# Patient Record
Sex: Female | Born: 1940
Health system: Southern US, Community
[De-identification: ages and names within clinical notes are randomized; demographics above are authoritative.]

## PROBLEM LIST (undated history)

## (undated) DIAGNOSIS — E785 Hyperlipidemia, unspecified: Secondary | ICD-10-CM

## (undated) DIAGNOSIS — G20 Parkinson's disease: Secondary | ICD-10-CM

## (undated) DIAGNOSIS — F329 Major depressive disorder, single episode, unspecified: Secondary | ICD-10-CM

## (undated) DIAGNOSIS — H269 Unspecified cataract: Secondary | ICD-10-CM

## (undated) DIAGNOSIS — Z86010 Personal history of colonic polyps: Secondary | ICD-10-CM

## (undated) DIAGNOSIS — H04123 Dry eye syndrome of bilateral lacrimal glands: Secondary | ICD-10-CM

## (undated) DIAGNOSIS — Z860101 Personal history of adenomatous and serrated colon polyps: Secondary | ICD-10-CM

## (undated) DIAGNOSIS — F32A Depression, unspecified: Secondary | ICD-10-CM

## (undated) DIAGNOSIS — K5790 Diverticulosis of intestine, part unspecified, without perforation or abscess without bleeding: Secondary | ICD-10-CM

## (undated) DIAGNOSIS — K648 Other hemorrhoids: Secondary | ICD-10-CM

## (undated) HISTORY — PX: EYE SURGERY: SHX253

## (undated) HISTORY — DX: Diverticulosis of intestine, part unspecified, without perforation or abscess without bleeding: K57.90

## (undated) HISTORY — DX: Personal history of colonic polyps: Z86.010

## (undated) HISTORY — DX: Unspecified cataract: H26.9

## (undated) HISTORY — DX: Personal history of adenomatous and serrated colon polyps: Z86.0101

## (undated) HISTORY — PX: OTHER SURGICAL HISTORY: SHX169

## (undated) HISTORY — PX: COLONOSCOPY: SHX174

## (undated) HISTORY — DX: Dry eye syndrome of bilateral lacrimal glands: H04.123

## (undated) HISTORY — DX: Parkinson's disease: G20

## (undated) HISTORY — PX: PELVIC FLOOR REPAIR: SHX2192

## (undated) HISTORY — PX: TONSILLECTOMY: SUR1361

## (undated) HISTORY — DX: Major depressive disorder, single episode, unspecified: F32.9

## (undated) HISTORY — DX: Depression, unspecified: F32.A

## (undated) HISTORY — PX: VAGINAL HYSTERECTOMY: SUR661

## (undated) HISTORY — DX: Other hemorrhoids: K64.8

---

## 2011-04-29 ENCOUNTER — Other Ambulatory Visit: Payer: Self-pay | Admitting: Family Medicine

## 2011-04-29 ENCOUNTER — Other Ambulatory Visit: Payer: Self-pay | Admitting: Obstetrics & Gynecology

## 2011-04-29 DIAGNOSIS — Z1231 Encounter for screening mammogram for malignant neoplasm of breast: Secondary | ICD-10-CM

## 2011-06-02 ENCOUNTER — Ambulatory Visit: Payer: Self-pay | Admitting: Internal Medicine

## 2011-06-21 ENCOUNTER — Ambulatory Visit
Admission: RE | Admit: 2011-06-21 | Discharge: 2011-06-21 | Disposition: A | Discharge status: Home / Self Care | Payer: Medicare Other | Source: Ambulatory Visit | Attending: Obstetrics & Gynecology | Admitting: Obstetrics & Gynecology

## 2011-06-21 DIAGNOSIS — Z1231 Encounter for screening mammogram for malignant neoplasm of breast: Secondary | ICD-10-CM

## 2011-07-19 DIAGNOSIS — M8430XA Stress fracture, unspecified site, initial encounter for fracture: Secondary | ICD-10-CM | POA: Diagnosis not present

## 2011-08-02 DIAGNOSIS — F329 Major depressive disorder, single episode, unspecified: Secondary | ICD-10-CM | POA: Diagnosis not present

## 2011-08-27 ENCOUNTER — Other Ambulatory Visit (HOSPITAL_COMMUNITY)
Admission: RE | Admit: 2011-08-27 | Discharge: 2011-08-27 | Disposition: A | Discharge status: Home / Self Care | Payer: Medicare Other | Source: Ambulatory Visit | Attending: Obstetrics and Gynecology | Admitting: Obstetrics and Gynecology

## 2011-08-27 DIAGNOSIS — D071 Carcinoma in situ of vulva: Secondary | ICD-10-CM | POA: Diagnosis not present

## 2011-08-27 DIAGNOSIS — Z124 Encounter for screening for malignant neoplasm of cervix: Secondary | ICD-10-CM | POA: Diagnosis not present

## 2011-08-27 DIAGNOSIS — Z01419 Encounter for gynecological examination (general) (routine) without abnormal findings: Secondary | ICD-10-CM | POA: Diagnosis not present

## 2011-08-27 DIAGNOSIS — R350 Frequency of micturition: Secondary | ICD-10-CM | POA: Diagnosis not present

## 2011-10-04 DIAGNOSIS — M25579 Pain in unspecified ankle and joints of unspecified foot: Secondary | ICD-10-CM | POA: Diagnosis not present

## 2011-10-04 DIAGNOSIS — M8430XA Stress fracture, unspecified site, initial encounter for fracture: Secondary | ICD-10-CM | POA: Diagnosis not present

## 2011-10-07 DIAGNOSIS — J029 Acute pharyngitis, unspecified: Secondary | ICD-10-CM | POA: Diagnosis not present

## 2011-10-07 DIAGNOSIS — R52 Pain, unspecified: Secondary | ICD-10-CM | POA: Diagnosis not present

## 2011-10-07 DIAGNOSIS — J329 Chronic sinusitis, unspecified: Secondary | ICD-10-CM | POA: Diagnosis not present

## 2011-10-14 DIAGNOSIS — M25579 Pain in unspecified ankle and joints of unspecified foot: Secondary | ICD-10-CM | POA: Diagnosis not present

## 2011-10-14 DIAGNOSIS — M8430XA Stress fracture, unspecified site, initial encounter for fracture: Secondary | ICD-10-CM | POA: Diagnosis not present

## 2011-10-19 DIAGNOSIS — M25579 Pain in unspecified ankle and joints of unspecified foot: Secondary | ICD-10-CM | POA: Diagnosis not present

## 2011-11-01 DIAGNOSIS — F329 Major depressive disorder, single episode, unspecified: Secondary | ICD-10-CM | POA: Diagnosis not present

## 2011-12-15 DIAGNOSIS — M25579 Pain in unspecified ankle and joints of unspecified foot: Secondary | ICD-10-CM | POA: Diagnosis not present

## 2011-12-21 DIAGNOSIS — M19079 Primary osteoarthritis, unspecified ankle and foot: Secondary | ICD-10-CM | POA: Diagnosis not present

## 2011-12-23 DIAGNOSIS — S93409A Sprain of unspecified ligament of unspecified ankle, initial encounter: Secondary | ICD-10-CM | POA: Diagnosis not present

## 2012-01-03 DIAGNOSIS — M24873 Other specific joint derangements of unspecified ankle, not elsewhere classified: Secondary | ICD-10-CM | POA: Diagnosis not present

## 2012-01-03 DIAGNOSIS — S93409A Sprain of unspecified ligament of unspecified ankle, initial encounter: Secondary | ICD-10-CM | POA: Diagnosis not present

## 2012-01-03 DIAGNOSIS — N760 Acute vaginitis: Secondary | ICD-10-CM | POA: Diagnosis not present

## 2012-01-03 DIAGNOSIS — D071 Carcinoma in situ of vulva: Secondary | ICD-10-CM | POA: Diagnosis not present

## 2012-01-03 DIAGNOSIS — M6281 Muscle weakness (generalized): Secondary | ICD-10-CM | POA: Diagnosis not present

## 2012-01-03 DIAGNOSIS — M24876 Other specific joint derangements of unspecified foot, not elsewhere classified: Secondary | ICD-10-CM | POA: Diagnosis not present

## 2012-01-03 DIAGNOSIS — M25579 Pain in unspecified ankle and joints of unspecified foot: Secondary | ICD-10-CM | POA: Diagnosis not present

## 2012-01-10 DIAGNOSIS — S93409A Sprain of unspecified ligament of unspecified ankle, initial encounter: Secondary | ICD-10-CM | POA: Diagnosis not present

## 2012-01-10 DIAGNOSIS — M25579 Pain in unspecified ankle and joints of unspecified foot: Secondary | ICD-10-CM | POA: Diagnosis not present

## 2012-01-10 DIAGNOSIS — M6281 Muscle weakness (generalized): Secondary | ICD-10-CM | POA: Diagnosis not present

## 2012-01-12 DIAGNOSIS — M24873 Other specific joint derangements of unspecified ankle, not elsewhere classified: Secondary | ICD-10-CM | POA: Diagnosis not present

## 2012-01-12 DIAGNOSIS — M25579 Pain in unspecified ankle and joints of unspecified foot: Secondary | ICD-10-CM | POA: Diagnosis not present

## 2012-01-12 DIAGNOSIS — M24876 Other specific joint derangements of unspecified foot, not elsewhere classified: Secondary | ICD-10-CM | POA: Diagnosis not present

## 2012-01-12 DIAGNOSIS — M6281 Muscle weakness (generalized): Secondary | ICD-10-CM | POA: Diagnosis not present

## 2012-01-17 DIAGNOSIS — M6281 Muscle weakness (generalized): Secondary | ICD-10-CM | POA: Diagnosis not present

## 2012-01-17 DIAGNOSIS — M25579 Pain in unspecified ankle and joints of unspecified foot: Secondary | ICD-10-CM | POA: Diagnosis not present

## 2012-01-17 DIAGNOSIS — S93409A Sprain of unspecified ligament of unspecified ankle, initial encounter: Secondary | ICD-10-CM | POA: Diagnosis not present

## 2012-01-19 DIAGNOSIS — M6281 Muscle weakness (generalized): Secondary | ICD-10-CM | POA: Diagnosis not present

## 2012-01-19 DIAGNOSIS — S93409A Sprain of unspecified ligament of unspecified ankle, initial encounter: Secondary | ICD-10-CM | POA: Diagnosis not present

## 2012-01-19 DIAGNOSIS — M25579 Pain in unspecified ankle and joints of unspecified foot: Secondary | ICD-10-CM | POA: Diagnosis not present

## 2012-01-20 DIAGNOSIS — L03119 Cellulitis of unspecified part of limb: Secondary | ICD-10-CM | POA: Diagnosis not present

## 2012-01-20 DIAGNOSIS — L02419 Cutaneous abscess of limb, unspecified: Secondary | ICD-10-CM | POA: Diagnosis not present

## 2012-01-24 DIAGNOSIS — M25579 Pain in unspecified ankle and joints of unspecified foot: Secondary | ICD-10-CM | POA: Diagnosis not present

## 2012-01-24 DIAGNOSIS — M6281 Muscle weakness (generalized): Secondary | ICD-10-CM | POA: Diagnosis not present

## 2012-01-24 DIAGNOSIS — S93409A Sprain of unspecified ligament of unspecified ankle, initial encounter: Secondary | ICD-10-CM | POA: Diagnosis not present

## 2012-01-26 DIAGNOSIS — S93409A Sprain of unspecified ligament of unspecified ankle, initial encounter: Secondary | ICD-10-CM | POA: Diagnosis not present

## 2012-01-26 DIAGNOSIS — M25579 Pain in unspecified ankle and joints of unspecified foot: Secondary | ICD-10-CM | POA: Diagnosis not present

## 2012-01-26 DIAGNOSIS — M6281 Muscle weakness (generalized): Secondary | ICD-10-CM | POA: Diagnosis not present

## 2012-01-31 DIAGNOSIS — N900 Mild vulvar dysplasia: Secondary | ICD-10-CM | POA: Diagnosis not present

## 2012-01-31 DIAGNOSIS — M25579 Pain in unspecified ankle and joints of unspecified foot: Secondary | ICD-10-CM | POA: Diagnosis not present

## 2012-01-31 DIAGNOSIS — M6281 Muscle weakness (generalized): Secondary | ICD-10-CM | POA: Diagnosis not present

## 2012-01-31 DIAGNOSIS — S93409A Sprain of unspecified ligament of unspecified ankle, initial encounter: Secondary | ICD-10-CM | POA: Diagnosis not present

## 2012-01-31 DIAGNOSIS — F329 Major depressive disorder, single episode, unspecified: Secondary | ICD-10-CM | POA: Diagnosis not present

## 2012-02-02 DIAGNOSIS — S93409A Sprain of unspecified ligament of unspecified ankle, initial encounter: Secondary | ICD-10-CM | POA: Diagnosis not present

## 2012-02-02 DIAGNOSIS — M6281 Muscle weakness (generalized): Secondary | ICD-10-CM | POA: Diagnosis not present

## 2012-02-02 DIAGNOSIS — M25579 Pain in unspecified ankle and joints of unspecified foot: Secondary | ICD-10-CM | POA: Diagnosis not present

## 2012-02-03 DIAGNOSIS — M25579 Pain in unspecified ankle and joints of unspecified foot: Secondary | ICD-10-CM | POA: Diagnosis not present

## 2012-02-08 DIAGNOSIS — D692 Other nonthrombocytopenic purpura: Secondary | ICD-10-CM | POA: Diagnosis not present

## 2012-02-08 DIAGNOSIS — L259 Unspecified contact dermatitis, unspecified cause: Secondary | ICD-10-CM | POA: Diagnosis not present

## 2012-02-08 DIAGNOSIS — L821 Other seborrheic keratosis: Secondary | ICD-10-CM | POA: Diagnosis not present

## 2012-02-28 DIAGNOSIS — F331 Major depressive disorder, recurrent, moderate: Secondary | ICD-10-CM | POA: Diagnosis not present

## 2012-03-10 DIAGNOSIS — F331 Major depressive disorder, recurrent, moderate: Secondary | ICD-10-CM | POA: Diagnosis not present

## 2012-03-14 DIAGNOSIS — L820 Inflamed seborrheic keratosis: Secondary | ICD-10-CM | POA: Diagnosis not present

## 2012-03-14 DIAGNOSIS — H01139 Eczematous dermatitis of unspecified eye, unspecified eyelid: Secondary | ICD-10-CM | POA: Diagnosis not present

## 2012-03-14 DIAGNOSIS — D485 Neoplasm of uncertain behavior of skin: Secondary | ICD-10-CM | POA: Diagnosis not present

## 2012-03-21 DIAGNOSIS — F331 Major depressive disorder, recurrent, moderate: Secondary | ICD-10-CM | POA: Diagnosis not present

## 2012-03-27 DIAGNOSIS — F331 Major depressive disorder, recurrent, moderate: Secondary | ICD-10-CM | POA: Diagnosis not present

## 2012-04-03 DIAGNOSIS — F331 Major depressive disorder, recurrent, moderate: Secondary | ICD-10-CM | POA: Diagnosis not present

## 2012-04-10 DIAGNOSIS — F331 Major depressive disorder, recurrent, moderate: Secondary | ICD-10-CM | POA: Diagnosis not present

## 2012-04-18 DIAGNOSIS — F331 Major depressive disorder, recurrent, moderate: Secondary | ICD-10-CM | POA: Diagnosis not present

## 2012-04-19 DIAGNOSIS — Z85828 Personal history of other malignant neoplasm of skin: Secondary | ICD-10-CM | POA: Diagnosis not present

## 2012-04-19 DIAGNOSIS — H01139 Eczematous dermatitis of unspecified eye, unspecified eyelid: Secondary | ICD-10-CM | POA: Diagnosis not present

## 2012-04-19 DIAGNOSIS — Z8049 Family history of malignant neoplasm of other genital organs: Secondary | ICD-10-CM | POA: Diagnosis not present

## 2012-04-19 DIAGNOSIS — D485 Neoplasm of uncertain behavior of skin: Secondary | ICD-10-CM | POA: Diagnosis not present

## 2012-04-19 DIAGNOSIS — L820 Inflamed seborrheic keratosis: Secondary | ICD-10-CM | POA: Diagnosis not present

## 2012-04-19 DIAGNOSIS — L570 Actinic keratosis: Secondary | ICD-10-CM | POA: Diagnosis not present

## 2012-04-19 DIAGNOSIS — L821 Other seborrheic keratosis: Secondary | ICD-10-CM | POA: Diagnosis not present

## 2012-04-19 DIAGNOSIS — Z85820 Personal history of malignant melanoma of skin: Secondary | ICD-10-CM | POA: Diagnosis not present

## 2012-04-24 DIAGNOSIS — L0889 Other specified local infections of the skin and subcutaneous tissue: Secondary | ICD-10-CM | POA: Diagnosis not present

## 2012-04-26 DIAGNOSIS — F331 Major depressive disorder, recurrent, moderate: Secondary | ICD-10-CM | POA: Diagnosis not present

## 2012-05-01 DIAGNOSIS — F331 Major depressive disorder, recurrent, moderate: Secondary | ICD-10-CM | POA: Diagnosis not present

## 2012-05-02 DIAGNOSIS — F331 Major depressive disorder, recurrent, moderate: Secondary | ICD-10-CM | POA: Diagnosis not present

## 2012-05-03 DIAGNOSIS — F329 Major depressive disorder, single episode, unspecified: Secondary | ICD-10-CM | POA: Diagnosis not present

## 2012-05-03 DIAGNOSIS — M899 Disorder of bone, unspecified: Secondary | ICD-10-CM | POA: Diagnosis not present

## 2012-05-03 DIAGNOSIS — R6889 Other general symptoms and signs: Secondary | ICD-10-CM | POA: Diagnosis not present

## 2012-05-03 DIAGNOSIS — Z136 Encounter for screening for cardiovascular disorders: Secondary | ICD-10-CM | POA: Diagnosis not present

## 2012-05-03 DIAGNOSIS — Z0000 Encounter for general adult medical examination without abnormal findings: Secondary | ICD-10-CM | POA: Diagnosis not present

## 2012-05-05 DIAGNOSIS — F331 Major depressive disorder, recurrent, moderate: Secondary | ICD-10-CM | POA: Diagnosis not present

## 2012-05-08 DIAGNOSIS — F331 Major depressive disorder, recurrent, moderate: Secondary | ICD-10-CM | POA: Diagnosis not present

## 2012-05-09 DIAGNOSIS — F331 Major depressive disorder, recurrent, moderate: Secondary | ICD-10-CM | POA: Diagnosis not present

## 2012-05-15 DIAGNOSIS — F331 Major depressive disorder, recurrent, moderate: Secondary | ICD-10-CM | POA: Diagnosis not present

## 2012-05-17 DIAGNOSIS — F331 Major depressive disorder, recurrent, moderate: Secondary | ICD-10-CM | POA: Diagnosis not present

## 2012-05-19 DIAGNOSIS — F331 Major depressive disorder, recurrent, moderate: Secondary | ICD-10-CM | POA: Diagnosis not present

## 2012-05-22 DIAGNOSIS — F331 Major depressive disorder, recurrent, moderate: Secondary | ICD-10-CM | POA: Diagnosis not present

## 2012-05-24 DIAGNOSIS — F331 Major depressive disorder, recurrent, moderate: Secondary | ICD-10-CM | POA: Diagnosis not present

## 2012-05-26 DIAGNOSIS — F331 Major depressive disorder, recurrent, moderate: Secondary | ICD-10-CM | POA: Diagnosis not present

## 2012-05-29 DIAGNOSIS — F331 Major depressive disorder, recurrent, moderate: Secondary | ICD-10-CM | POA: Diagnosis not present

## 2012-06-01 DIAGNOSIS — D072 Carcinoma in situ of vagina: Secondary | ICD-10-CM | POA: Diagnosis not present

## 2012-06-05 DIAGNOSIS — F331 Major depressive disorder, recurrent, moderate: Secondary | ICD-10-CM | POA: Diagnosis not present

## 2012-06-12 DIAGNOSIS — F331 Major depressive disorder, recurrent, moderate: Secondary | ICD-10-CM | POA: Diagnosis not present

## 2012-06-14 DIAGNOSIS — F331 Major depressive disorder, recurrent, moderate: Secondary | ICD-10-CM | POA: Diagnosis not present

## 2012-06-21 ENCOUNTER — Other Ambulatory Visit: Payer: Self-pay | Admitting: Family Medicine

## 2012-06-21 DIAGNOSIS — F331 Major depressive disorder, recurrent, moderate: Secondary | ICD-10-CM | POA: Diagnosis not present

## 2012-06-21 DIAGNOSIS — Z1231 Encounter for screening mammogram for malignant neoplasm of breast: Secondary | ICD-10-CM

## 2012-06-22 DIAGNOSIS — M899 Disorder of bone, unspecified: Secondary | ICD-10-CM | POA: Diagnosis not present

## 2012-06-22 DIAGNOSIS — M949 Disorder of cartilage, unspecified: Secondary | ICD-10-CM | POA: Diagnosis not present

## 2012-06-22 DIAGNOSIS — F329 Major depressive disorder, single episode, unspecified: Secondary | ICD-10-CM | POA: Diagnosis not present

## 2012-06-22 DIAGNOSIS — Z0000 Encounter for general adult medical examination without abnormal findings: Secondary | ICD-10-CM | POA: Diagnosis not present

## 2012-06-26 DIAGNOSIS — F331 Major depressive disorder, recurrent, moderate: Secondary | ICD-10-CM | POA: Diagnosis not present

## 2012-07-03 DIAGNOSIS — F331 Major depressive disorder, recurrent, moderate: Secondary | ICD-10-CM | POA: Diagnosis not present

## 2012-07-11 DIAGNOSIS — F331 Major depressive disorder, recurrent, moderate: Secondary | ICD-10-CM | POA: Diagnosis not present

## 2012-07-13 ENCOUNTER — Ambulatory Visit
Admission: RE | Admit: 2012-07-13 | Discharge: 2012-07-13 | Disposition: A | Discharge status: Home / Self Care | Payer: Medicare Other | Source: Ambulatory Visit | Attending: Family Medicine | Admitting: Family Medicine

## 2012-07-13 DIAGNOSIS — Z1231 Encounter for screening mammogram for malignant neoplasm of breast: Secondary | ICD-10-CM | POA: Diagnosis not present

## 2012-07-17 DIAGNOSIS — F331 Major depressive disorder, recurrent, moderate: Secondary | ICD-10-CM | POA: Diagnosis not present

## 2012-07-22 DIAGNOSIS — R300 Dysuria: Secondary | ICD-10-CM | POA: Diagnosis not present

## 2012-07-22 DIAGNOSIS — R509 Fever, unspecified: Secondary | ICD-10-CM | POA: Diagnosis not present

## 2012-07-22 DIAGNOSIS — J209 Acute bronchitis, unspecified: Secondary | ICD-10-CM | POA: Diagnosis not present

## 2012-07-24 DIAGNOSIS — F331 Major depressive disorder, recurrent, moderate: Secondary | ICD-10-CM | POA: Diagnosis not present

## 2012-07-28 DIAGNOSIS — F331 Major depressive disorder, recurrent, moderate: Secondary | ICD-10-CM | POA: Diagnosis not present

## 2012-07-31 DIAGNOSIS — M25569 Pain in unspecified knee: Secondary | ICD-10-CM | POA: Diagnosis not present

## 2012-07-31 DIAGNOSIS — F331 Major depressive disorder, recurrent, moderate: Secondary | ICD-10-CM | POA: Diagnosis not present

## 2012-08-08 DIAGNOSIS — F331 Major depressive disorder, recurrent, moderate: Secondary | ICD-10-CM | POA: Diagnosis not present

## 2012-08-14 DIAGNOSIS — F331 Major depressive disorder, recurrent, moderate: Secondary | ICD-10-CM | POA: Diagnosis not present

## 2012-08-21 DIAGNOSIS — F331 Major depressive disorder, recurrent, moderate: Secondary | ICD-10-CM | POA: Diagnosis not present

## 2012-08-28 DIAGNOSIS — F331 Major depressive disorder, recurrent, moderate: Secondary | ICD-10-CM | POA: Diagnosis not present

## 2012-09-11 DIAGNOSIS — F331 Major depressive disorder, recurrent, moderate: Secondary | ICD-10-CM | POA: Diagnosis not present

## 2012-09-12 DIAGNOSIS — L989 Disorder of the skin and subcutaneous tissue, unspecified: Secondary | ICD-10-CM | POA: Diagnosis not present

## 2012-09-25 DIAGNOSIS — F331 Major depressive disorder, recurrent, moderate: Secondary | ICD-10-CM | POA: Diagnosis not present

## 2012-10-02 DIAGNOSIS — H26499 Other secondary cataract, unspecified eye: Secondary | ICD-10-CM | POA: Diagnosis not present

## 2012-10-02 DIAGNOSIS — Z961 Presence of intraocular lens: Secondary | ICD-10-CM | POA: Diagnosis not present

## 2012-10-02 DIAGNOSIS — F331 Major depressive disorder, recurrent, moderate: Secondary | ICD-10-CM | POA: Diagnosis not present

## 2012-10-03 DIAGNOSIS — R159 Full incontinence of feces: Secondary | ICD-10-CM | POA: Diagnosis not present

## 2012-10-03 DIAGNOSIS — Z0000 Encounter for general adult medical examination without abnormal findings: Secondary | ICD-10-CM | POA: Diagnosis not present

## 2012-10-03 DIAGNOSIS — Z01419 Encounter for gynecological examination (general) (routine) without abnormal findings: Secondary | ICD-10-CM | POA: Diagnosis not present

## 2012-10-03 DIAGNOSIS — Z8544 Personal history of malignant neoplasm of other female genital organs: Secondary | ICD-10-CM | POA: Diagnosis not present

## 2012-10-10 DIAGNOSIS — H04129 Dry eye syndrome of unspecified lacrimal gland: Secondary | ICD-10-CM | POA: Diagnosis not present

## 2012-10-11 DIAGNOSIS — F331 Major depressive disorder, recurrent, moderate: Secondary | ICD-10-CM | POA: Diagnosis not present

## 2012-10-16 DIAGNOSIS — F331 Major depressive disorder, recurrent, moderate: Secondary | ICD-10-CM | POA: Diagnosis not present

## 2012-10-23 DIAGNOSIS — F331 Major depressive disorder, recurrent, moderate: Secondary | ICD-10-CM | POA: Diagnosis not present

## 2012-10-23 DIAGNOSIS — H26499 Other secondary cataract, unspecified eye: Secondary | ICD-10-CM | POA: Diagnosis not present

## 2012-10-30 ENCOUNTER — Encounter: Payer: Self-pay | Admitting: Internal Medicine

## 2012-10-30 DIAGNOSIS — F331 Major depressive disorder, recurrent, moderate: Secondary | ICD-10-CM | POA: Diagnosis not present

## 2012-11-02 DIAGNOSIS — F331 Major depressive disorder, recurrent, moderate: Secondary | ICD-10-CM | POA: Diagnosis not present

## 2012-11-06 DIAGNOSIS — F331 Major depressive disorder, recurrent, moderate: Secondary | ICD-10-CM | POA: Diagnosis not present

## 2012-11-09 DIAGNOSIS — F331 Major depressive disorder, recurrent, moderate: Secondary | ICD-10-CM | POA: Diagnosis not present

## 2012-11-15 DIAGNOSIS — F331 Major depressive disorder, recurrent, moderate: Secondary | ICD-10-CM | POA: Diagnosis not present

## 2012-11-17 DIAGNOSIS — F331 Major depressive disorder, recurrent, moderate: Secondary | ICD-10-CM | POA: Diagnosis not present

## 2012-11-20 DIAGNOSIS — F331 Major depressive disorder, recurrent, moderate: Secondary | ICD-10-CM | POA: Diagnosis not present

## 2012-11-24 DIAGNOSIS — F331 Major depressive disorder, recurrent, moderate: Secondary | ICD-10-CM | POA: Diagnosis not present

## 2012-11-27 DIAGNOSIS — M6740 Ganglion, unspecified site: Secondary | ICD-10-CM | POA: Diagnosis not present

## 2012-11-27 DIAGNOSIS — R0989 Other specified symptoms and signs involving the circulatory and respiratory systems: Secondary | ICD-10-CM | POA: Diagnosis not present

## 2012-11-27 DIAGNOSIS — E785 Hyperlipidemia, unspecified: Secondary | ICD-10-CM | POA: Diagnosis not present

## 2012-11-27 DIAGNOSIS — Z79899 Other long term (current) drug therapy: Secondary | ICD-10-CM | POA: Diagnosis not present

## 2012-11-27 DIAGNOSIS — I6529 Occlusion and stenosis of unspecified carotid artery: Secondary | ICD-10-CM | POA: Diagnosis not present

## 2012-12-01 ENCOUNTER — Encounter: Payer: Self-pay | Admitting: Internal Medicine

## 2012-12-01 ENCOUNTER — Ambulatory Visit (AMBULATORY_SURGERY_CENTER): Payer: Medicare Other

## 2012-12-01 VITALS — Ht 65.5 in | Wt 143.4 lb

## 2012-12-01 DIAGNOSIS — Z1211 Encounter for screening for malignant neoplasm of colon: Secondary | ICD-10-CM

## 2012-12-01 DIAGNOSIS — Z86010 Personal history of colonic polyps: Secondary | ICD-10-CM

## 2012-12-01 MED ORDER — MOVIPREP 100 G PO SOLR: ORAL | Status: DC

## 2012-12-01 NOTE — Progress Notes (Signed)
Patient ID: Sylvia Maldonado, female   DOB: 1941/06/29, 71 y.o.   MRN: [id number] Rec'd records from [facility] , put on Dr. Regino Schultze desk for review.

## 2012-12-01 NOTE — Progress Notes (Signed)
Pt came into the office today for her pre-visit prior to her colonoscopy with Dr Juanda Chance on 12/22/12.Pt states she had a colonoscopy done in [place] 5 years ago. Pt signed a medical release form which will be given to Dr Regino Schultze CMA.

## 2012-12-04 DIAGNOSIS — F331 Major depressive disorder, recurrent, moderate: Secondary | ICD-10-CM | POA: Diagnosis not present

## 2012-12-08 DIAGNOSIS — F331 Major depressive disorder, recurrent, moderate: Secondary | ICD-10-CM | POA: Diagnosis not present

## 2012-12-11 DIAGNOSIS — F331 Major depressive disorder, recurrent, moderate: Secondary | ICD-10-CM | POA: Diagnosis not present

## 2012-12-15 DIAGNOSIS — F331 Major depressive disorder, recurrent, moderate: Secondary | ICD-10-CM | POA: Diagnosis not present

## 2012-12-18 DIAGNOSIS — F331 Major depressive disorder, recurrent, moderate: Secondary | ICD-10-CM | POA: Diagnosis not present

## 2012-12-22 ENCOUNTER — Encounter: Payer: Self-pay | Admitting: Internal Medicine

## 2012-12-22 ENCOUNTER — Ambulatory Visit (AMBULATORY_SURGERY_CENTER): Payer: Medicare Other | Admitting: Internal Medicine

## 2012-12-22 VITALS — BP 132/62 | HR 58 | Temp 97.7°F | Resp 14 | Ht 65.0 in | Wt 143.0 lb

## 2012-12-22 DIAGNOSIS — Z1211 Encounter for screening for malignant neoplasm of colon: Secondary | ICD-10-CM

## 2012-12-22 DIAGNOSIS — Z86010 Personal history of colonic polyps: Secondary | ICD-10-CM | POA: Diagnosis not present

## 2012-12-22 MED ORDER — SODIUM CHLORIDE 0.9 % IV SOLN: 500.0000 mL | INTRAVENOUS | Status: DC

## 2012-12-22 NOTE — Progress Notes (Signed)
To recovery, awake, report given, VSS.

## 2012-12-22 NOTE — Op Note (Signed)
[facility] [address] [place], [address]   COLONOSCOPY PROCEDURE REPORT  PATIENT: Sylvia Maldonado, Sylvia Maldonado  MR#: [id number] BIRTHDATE: [birth year] , 71  yrs. old GENDER: Female ENDOSCOPIST: Hart Carwin, MD REFERRED BY:  Juluis Rainier, M.D. PROCEDURE DATE:  12/22/2012 PROCEDURE:   Colonoscopy, screening ASA CLASS:   Class II INDICATIONS:Patient's personal history of adenomatous colon polyps and colonoscopy in [place] and 2009- adenomatous and hyperplastic polyp. MEDICATIONS: MAC sedation, administered by CRNA and Propofol (Diprivan) 180 mg IV  DESCRIPTION OF PROCEDURE:   After the risks and benefits and of the procedure were explained, informed consent was obtained.  A digital rectal exam revealed no abnormalities of the rectum.    The LB [id number]  endoscope was introduced through the anus and advanced to the cecum, which was identified by both the appendix and ileocecal valve .  The quality of the prep was good, using MoviPrep .  The instrument was then slowly withdrawn as the colon was fully examined.     COLON FINDINGS: There was mild diverticulosis noted in the sigmoid colon with associated muscular hypertrophy.     Retroflexed views revealed no abnormalities.     The scope was then withdrawn from the patient and the procedure completed.  COMPLICATIONS: There were no complications. ENDOSCOPIC IMPRESSION: There was mild diverticulosis noted in the sigmoid colon no recurrent polyps  RECOMMENDATIONS: High fiber diet   REPEAT EXAM: In 10 year(s)  for Colonoscopy.  cc:  _______________________________ eSignedHart Carwin, MD 12/22/2012 8:33 AM

## 2012-12-22 NOTE — Progress Notes (Signed)
Patient did not have preoperative order for IV antibiotic SSI prophylaxis. (G8918)  Patient did not experience any of the following events: a burn prior to discharge; a fall within the facility; wrong site/side/patient/procedure/implant event; or a hospital transfer or hospital admission upon discharge from the facility. (G8907)  

## 2012-12-22 NOTE — Patient Instructions (Addendum)
YOU HAD AN ENDOSCOPIC PROCEDURE TODAY AT THE [facility]: Refer to the procedure report that was given to you for any specific questions about what was found during the examination.  If the procedure report does not answer your questions, please call your gastroenterologist to clarify.  If you requested that your care partner not be given the details of your procedure findings, then the procedure report has been included in a sealed envelope for you to review at your convenience later.  YOU SHOULD EXPECT: Some feelings of bloating in the abdomen. Passage of more gas than usual.  Walking can help get rid of the air that was put into your GI tract during the procedure and reduce the bloating. If you had a lower endoscopy (such as a colonoscopy or flexible sigmoidoscopy) you may notice spotting of blood in your stool or on the toilet paper. If you underwent a bowel prep for your procedure, then you may not have a normal bowel movement for a few days.  DIET: Your first meal following the procedure should be a light meal and then it is ok to progress to your normal diet.  A half-sandwich or bowl of soup is an example of a good first meal.  Heavy or fried foods are harder to digest and may make you feel nauseous or bloated.  Likewise meals heavy in dairy and vegetables can cause extra gas to form and this can also increase the bloating.  Drink plenty of fluids but you should avoid alcoholic beverages for 24 hours.  ACTIVITY: Your care partner should take you home directly after the procedure.  You should plan to take it easy, moving slowly for the rest of the day.  You can resume normal activity the day after the procedure however you should NOT DRIVE or use heavy machinery for 24 hours (because of the sedation medicines used during the test).    SYMPTOMS TO REPORT IMMEDIATELY: A gastroenterologist can be reached at any hour.  During normal business hours, 8:30 AM to 5:00 PM Monday through Friday,  call [phone].  After hours and on weekends, please call the GI answering service at [phone] who will take a message and have the physician on call contact you.   Following lower endoscopy (colonoscopy or flexible sigmoidoscopy):  Excessive amounts of blood in the stool  Significant tenderness or worsening of abdominal pains  Swelling of the abdomen that is new, acute  Fever of 100F or higher    FOLLOW UP: If any biopsies were taken you will be contacted by phone or by letter within the next 1-3 weeks.  Call your gastroenterologist if you have not heard about the biopsies in 3 weeks.  Our staff will call the home number listed on your records the next business day following your procedure to check on you and address any questions or concerns that you may have at that time regarding the information given to you following your procedure. This is a courtesy call and so if there is no answer at the home number and we have not heard from you through the emergency physician on call, we will assume that you have returned to your regular daily activities without incident.  SIGNATURES/CONFIDENTIALITY: You and/or your care partner have signed paperwork which will be entered into your electronic medical record.  These signatures attest to the fact that that the information above on your After Visit Summary has been reviewed and is understood.  Full responsibility of the confidentiality   of this discharge information lies with you and/or your care-partner.  High fiber diet 

## 2012-12-25 ENCOUNTER — Telehealth: Payer: Self-pay | Admitting: *Deleted

## 2012-12-25 DIAGNOSIS — F331 Major depressive disorder, recurrent, moderate: Secondary | ICD-10-CM | POA: Diagnosis not present

## 2012-12-25 NOTE — Telephone Encounter (Signed)
  Follow up Call-  Call back number 12/22/2012  Post procedure Call Back phone  # [phone]  Permission to leave phone message Yes     Patient questions:  Left message to call us if necessary.

## 2013-01-01 DIAGNOSIS — M25519 Pain in unspecified shoulder: Secondary | ICD-10-CM | POA: Diagnosis not present

## 2013-01-01 DIAGNOSIS — F331 Major depressive disorder, recurrent, moderate: Secondary | ICD-10-CM | POA: Diagnosis not present

## 2013-01-05 DIAGNOSIS — F331 Major depressive disorder, recurrent, moderate: Secondary | ICD-10-CM | POA: Diagnosis not present

## 2013-01-08 DIAGNOSIS — F331 Major depressive disorder, recurrent, moderate: Secondary | ICD-10-CM | POA: Diagnosis not present

## 2013-01-15 DIAGNOSIS — F331 Major depressive disorder, recurrent, moderate: Secondary | ICD-10-CM | POA: Diagnosis not present

## 2013-01-22 DIAGNOSIS — F331 Major depressive disorder, recurrent, moderate: Secondary | ICD-10-CM | POA: Diagnosis not present

## 2013-01-29 DIAGNOSIS — M25519 Pain in unspecified shoulder: Secondary | ICD-10-CM | POA: Diagnosis not present

## 2013-01-30 DIAGNOSIS — F331 Major depressive disorder, recurrent, moderate: Secondary | ICD-10-CM | POA: Diagnosis not present

## 2013-02-02 ENCOUNTER — Emergency Department (HOSPITAL_COMMUNITY): Payer: Medicare Other

## 2013-02-02 ENCOUNTER — Observation Stay (HOSPITAL_COMMUNITY)
Admission: EM | Admit: 2013-02-02 | Discharge: 2013-02-03 | Disposition: A | Discharge status: Home / Self Care | Payer: Medicare Other | Attending: Internal Medicine | Admitting: Internal Medicine

## 2013-02-02 ENCOUNTER — Encounter (HOSPITAL_COMMUNITY): Payer: Self-pay | Admitting: Emergency Medicine

## 2013-02-02 ENCOUNTER — Emergency Department (INDEPENDENT_AMBULATORY_CARE_PROVIDER_SITE_OTHER)
Admission: EM | Admit: 2013-02-02 | Discharge: 2013-02-02 | Disposition: A | Discharge status: Nursing Home (Includes ICF) | Payer: Medicare Other | Source: Home / Self Care | Attending: Emergency Medicine | Admitting: Emergency Medicine

## 2013-02-02 DIAGNOSIS — I498 Other specified cardiac arrhythmias: Secondary | ICD-10-CM | POA: Insufficient documentation

## 2013-02-02 DIAGNOSIS — F331 Major depressive disorder, recurrent, moderate: Secondary | ICD-10-CM | POA: Diagnosis not present

## 2013-02-02 DIAGNOSIS — I059 Rheumatic mitral valve disease, unspecified: Secondary | ICD-10-CM | POA: Insufficient documentation

## 2013-02-02 DIAGNOSIS — D72819 Decreased white blood cell count, unspecified: Secondary | ICD-10-CM | POA: Insufficient documentation

## 2013-02-02 DIAGNOSIS — I209 Angina pectoris, unspecified: Secondary | ICD-10-CM | POA: Diagnosis not present

## 2013-02-02 DIAGNOSIS — R0602 Shortness of breath: Secondary | ICD-10-CM | POA: Diagnosis not present

## 2013-02-02 DIAGNOSIS — I379 Nonrheumatic pulmonary valve disorder, unspecified: Secondary | ICD-10-CM | POA: Diagnosis not present

## 2013-02-02 DIAGNOSIS — R079 Chest pain, unspecified: Principal | ICD-10-CM | POA: Insufficient documentation

## 2013-02-02 DIAGNOSIS — J40 Bronchitis, not specified as acute or chronic: Secondary | ICD-10-CM | POA: Diagnosis not present

## 2013-02-02 DIAGNOSIS — Z8659 Personal history of other mental and behavioral disorders: Secondary | ICD-10-CM

## 2013-02-02 LAB — BASIC METABOLIC PANEL WITH GFR
BUN: 18 mg/dL (ref 6–23)
CO2: 25 meq/L (ref 19–32)
Calcium: 9.1 mg/dL (ref 8.4–10.5)
Chloride: 105 meq/L (ref 96–112)
Creatinine, Ser: 0.68 mg/dL (ref 0.50–1.10)
GFR calc Af Amer: >90 mL/min (ref >90)
GFR calc non Af Amer: 86 mL/min — ABNORMAL LOW (ref >90)
Glucose, Bld: 92 mg/dL (ref 70–99)
Potassium: 3.9 meq/L (ref 3.5–5.1)
Sodium: 139 meq/L (ref 135–145)

## 2013-02-02 LAB — CBC
HCT: 35.7 % — ABNORMAL LOW (ref 36.0–46.0)
Hemoglobin: 12 g/dL (ref 12.0–15.0)
MCH: 29.9 pg (ref 26.0–34.0)
MCHC: 33.6 g/dL (ref 30.0–36.0)
MCV: 88.8 fL (ref 78.0–100.0)
Platelets: 145 K/uL — ABNORMAL LOW (ref 150–400)
RBC: 4.02 MIL/uL (ref 3.87–5.11)
RDW: 14.1 % (ref 11.5–15.5)
WBC: 3.6 K/uL — ABNORMAL LOW (ref 4.0–10.5)

## 2013-02-02 LAB — POCT I-STAT TROPONIN I

## 2013-02-02 MED ORDER — ASPIRIN 81 MG PO CHEW
CHEWABLE_TABLET | ORAL | Status: AC
  Filled 2013-02-02: qty 1

## 2013-02-02 MED ORDER — ASPIRIN 81 MG PO CHEW
324.0000 mg | CHEWABLE_TABLET | Freq: Once | ORAL | Status: AC
  Administered 2013-02-02: 324 mg via ORAL

## 2013-02-02 MED ORDER — SODIUM CHLORIDE 0.9 % IV SOLN: INTRAVENOUS | Status: DC

## 2013-02-02 MED ORDER — ONDANSETRON HCL 4 MG PO TABS: 4.0000 mg | ORAL_TABLET | Freq: Four times a day (QID) | ORAL | Status: DC | PRN

## 2013-02-02 MED ORDER — ACETAMINOPHEN 650 MG RE SUPP: 650.0000 mg | Freq: Four times a day (QID) | RECTAL | Status: DC | PRN

## 2013-02-02 MED ORDER — SODIUM CHLORIDE 0.9 % IJ SOLN
3.0000 mL | Freq: Two times a day (BID) | INTRAMUSCULAR | Status: DC
  Administered 2013-02-03: 3 mL via INTRAVENOUS

## 2013-02-02 MED ORDER — ASPIRIN EC 325 MG PO TBEC
325.0000 mg | DELAYED_RELEASE_TABLET | Freq: Every day | ORAL | Status: DC
  Administered 2013-02-03: 325 mg via ORAL
  Filled 2013-02-02: qty 1

## 2013-02-02 MED ORDER — ESCITALOPRAM OXALATE 20 MG PO TABS
20.0000 mg | ORAL_TABLET | Freq: Every day | ORAL | Status: DC
  Administered 2013-02-03: 20 mg via ORAL
  Filled 2013-02-02: qty 1

## 2013-02-02 MED ORDER — LAMOTRIGINE 100 MG PO TABS
100.0000 mg | ORAL_TABLET | Freq: Every day | ORAL | Status: DC
  Filled 2013-02-02: qty 1

## 2013-02-02 MED ORDER — ADULT MULTIVITAMIN W/MINERALS CH
1.0000 | ORAL_TABLET | Freq: Every day | ORAL | Status: DC
  Administered 2013-02-03: 1 via ORAL
  Filled 2013-02-02: qty 1

## 2013-02-02 MED ORDER — ACETAMINOPHEN 325 MG PO TABS: 650.0000 mg | ORAL_TABLET | Freq: Four times a day (QID) | ORAL | Status: DC | PRN

## 2013-02-02 MED ORDER — ARIPIPRAZOLE 5 MG PO TABS
2.5000 mg | ORAL_TABLET | Freq: Every day | ORAL | Status: DC
  Administered 2013-02-03: 2.5 mg via ORAL
  Filled 2013-02-02: qty 1

## 2013-02-02 MED ORDER — CYCLOSPORINE 0.05 % OP EMUL
1.0000 [drp] | Freq: Two times a day (BID) | OPHTHALMIC | Status: DC
  Administered 2013-02-03: 1 [drp] via OPHTHALMIC
  Filled 2013-02-02 (×3): qty 1

## 2013-02-02 MED ORDER — NITROGLYCERIN 0.4 MG SL SUBL: 0.4000 mg | SUBLINGUAL_TABLET | SUBLINGUAL | Status: DC | PRN

## 2013-02-02 MED ORDER — ONDANSETRON HCL 4 MG/2ML IJ SOLN: 4.0000 mg | Freq: Four times a day (QID) | INTRAMUSCULAR | Status: DC | PRN

## 2013-02-02 MED ORDER — ENOXAPARIN SODIUM 40 MG/0.4ML ~~LOC~~ SOLN
40.0000 mg | Freq: Every day | SUBCUTANEOUS | Status: DC
  Administered 2013-02-03: 40 mg via SUBCUTANEOUS
  Filled 2013-02-02: qty 0.4

## 2013-02-02 NOTE — ED Provider Notes (Signed)
CSN: [id number]     Arrival date & time 02/02/13  2031 History     First MD Initiated Contact with Patient 02/02/13 2121     Chief Complaint  Patient presents with  . Chest Pain   (Consider location/radiation/quality/duration/timing/severity/associated sxs/prior Treatment) Patient is a 72 y.o. female presenting with chest pain. The history is provided by the patient.  Chest Pain Associated symptoms: shortness of breath   Associated symptoms: no abdominal pain, no back pain, no cough, no fever, no headache and no palpitations   pt c/o intermittent mid to left cp in past day. Several episodes. Dull pain. Non radiating. Lasts 20-30 minutes at a time. No associated nv, or diaphoresis. +mild sob. Denies palpitations or sense of rapid or irregular heartbeat. No hx cad or fam hx cad. No prior stress test or cardiac evaluation. Denies heartburn or hx gerd. No pleuritic pain. No leg pain or swelling. Denies cough or uri c/o. No fever or chills. Denies prior similar symptoms. Was seen at urgent care and referred to ED.     Past Medical History  Diagnosis Date  . Depression   . Dry eyes   . Cataract    Past Surgical History  Procedure Laterality Date  . Vaginal hysterectomy    . Cataracts surg    . Pelvic floor repair    . Colonoscopy    . Tonsillectomy     No family history on file. History  Substance Use Topics  . Smoking status: Never Smoker   . Smokeless tobacco: Never Used  . Alcohol Use: No   OB History   Grav Para Term Preterm Abortions TAB SAB Ect Mult Living                 Review of Systems  Constitutional: Negative for fever and chills.  HENT: Negative for neck pain.   Eyes: Negative for redness.  Respiratory: Positive for shortness of breath. Negative for cough.   Cardiovascular: Positive for chest pain. Negative for palpitations and leg swelling.  Gastrointestinal: Negative for abdominal pain.  Genitourinary: Negative for flank pain.  Musculoskeletal: Negative  for back pain.  Skin: Negative for rash.  Neurological: Negative for headaches.  Hematological: Does not bruise/bleed easily.  Psychiatric/Behavioral: Negative for confusion.    Allergies  Neosporin; Penicillins; Sulfa antibiotics; and Latex  Home Medications   Current Outpatient Rx  Name  Route  Sig  Dispense  Refill  . ARIPiprazole (ABILIFY) 5 MG tablet   Oral   Take 2.5 mg by mouth daily.         Marland Kitchen aspirin 81 MG tablet   Oral   Take 81 mg by mouth every other day.          . Calcium Carbonate-Vit D-Min (CALCIUM 1200 PO)   Oral   Take 1 tablet by mouth daily.          . cycloSPORINE (RESTASIS) 0.05 % ophthalmic emulsion   Both Eyes   Place 1 drop into both eyes 2 (two) times daily.         Marland Kitchen escitalopram (LEXAPRO) 20 MG tablet   Oral   Take 20 mg by mouth daily.         Marland Kitchen lamoTRIgine (LAMICTAL) 100 MG tablet   Oral   Take 100 mg by mouth daily.         . Multiple Vitamin (MULTIVITAMIN) tablet   Oral   Take 1 tablet by mouth daily.         Marland Kitchen  Omega-3 Fatty Acids (OMEGA 3 PO)   Oral   Take 2 capsules by mouth 2 (two) times daily.           BP 118/58  Pulse 67  Temp(Src) 97.8 F (36.6 C) (Oral)  Resp 14  SpO2 99% Physical Exam  Nursing note and vitals reviewed. Constitutional: She appears well-developed and well-nourished. No distress.  Eyes: Conjunctivae are normal. No scleral icterus.  Neck: Neck supple. No tracheal deviation present.  Cardiovascular: Normal rate, regular rhythm, normal heart sounds and intact distal pulses.  Exam reveals no gallop and no friction rub.   No murmur heard. Pulmonary/Chest: Effort normal and breath sounds normal. No respiratory distress. She exhibits no tenderness.  Abdominal: Soft. Normal appearance. She exhibits no distension. There is no tenderness.  Musculoskeletal: She exhibits no edema and no tenderness.  Neurological: She is alert.  Skin: Skin is warm and dry. No rash noted.  Psychiatric: She has a  normal mood and affect.    ED Course   Procedures (including critical care time)  Results for orders placed during the hospital encounter of 02/02/13  CBC      Result Value Range   WBC 3.6 (*) 4.0 - 10.5 K/uL   RBC 4.02  3.87 - 5.11 MIL/uL   Hemoglobin 12.0  12.0 - 15.0 g/dL   HCT 96.0 (*) 45.4 - 09.8 %   MCV 88.8  78.0 - 100.0 fL   MCH 29.9  26.0 - 34.0 pg   MCHC 33.6  30.0 - 36.0 g/dL   RDW 11.9  14.7 - 82.9 %   Platelets 145 (*) 150 - 400 K/uL  BASIC METABOLIC PANEL      Result Value Range   Sodium 139  135 - 145 mEq/L   Potassium 3.9  3.5 - 5.1 mEq/L   Chloride 105  96 - 112 mEq/L   CO2 25  19 - 32 mEq/L   Glucose, Bld 92  70 - 99 mg/dL   BUN 18  6 - 23 mg/dL   Creatinine, Ser 5.62  0.50 - 1.10 mg/dL   Calcium 9.1  8.4 - 13.0 mg/dL   GFR calc non Af Amer 86 (*) >90 mL/min   GFR calc Af Amer >90  >90 mL/min  PRO B NATRIURETIC PEPTIDE      Result Value Range   Pro B Natriuretic peptide (BNP) 62.0  0 - 125 pg/mL  POCT I-STAT TROPONIN I      Result Value Range   Troponin i, poc 0.02  0.00 - 0.08 ng/mL   Comment 3            Dg Chest 2 View  02/02/2013   *RADIOLOGY REPORT*  Clinical Data: Chest pain  CHEST - 2 VIEW  Comparison: None.  Findings:  Borderline enlarged cardiac silhouette and mediastinal contours. The lungs appear hyperexpanded with flattening of the bilateral hemidiaphragms and mild diffuse thickening of the pulmonary interstitium.  No focal airspace opacity.  No pleural effusion or pneumothorax.  No definite evidence of edema.  No acute osseous abnormalities.  IMPRESSION: Mild lung hyperexpansion and bronchitic change without acute cardiopulmonary disease.   Original Report Authenticated By: Tacey Ruiz, MD      MDM  Iv ns. Labs. Cxr.  Reviewed nursing notes and prior charts for additional history.    Date: 02/02/2013  Rate: 55  Rhythm: sinus bradycardia  QRS Axis: normal  Intervals: normal  ST/T Wave abnormalities: normal  Conduction  Disutrbances:none  Narrative  Interpretation:   Old EKG Reviewed: none available  Pt already had chewable asa. Is chest pain free.  Given new onset cp, possible new onset angina, hospitalist service called to admit - they state team 10, tele.  Recheck remains cp free.    Suzi Roots, MD 02/02/13 2216

## 2013-02-02 NOTE — ED Notes (Signed)
Reports chest pain for the past 24 hours. States pain is sharp in the center of the chest and it comes and goes. Dizziness.  Denies nausea and sob. Pt states that she has been having diarrhea. Denies hx of heart problems.  Pt is sitting up right alert and oriented. No acute signs of distress.

## 2013-02-02 NOTE — ED Notes (Signed)
PT. ARRIVED WITH EMS , PT. TRANSFERRED FROM [facility] , REPORTS  MID CHEST PAIN RADIATING TO LEFT ARM ONSET TODAY WITH SLIGHT SOB , DENIES DIAPHORESIS AND NAUSEA. PT. RECEIVED 4 BABY ASA AT URGENT CARE .

## 2013-02-02 NOTE — H&P (Addendum)
[facility] History and Physical  Oktober Glazer ZOX:[id number] DOB: 1941-03-29 DOA: 02/02/2013  Referring physician: ER physician. PCP: Gaye Alken, MD  Chief Complaint: Chest pain.  HPI: Sylvia Maldonado is a 72 y.o. female history of depression further effort to the ER from the urgent care Center for chest pain. Patient started experiencing chest pain yesterday for half an hour which resolved spontaneously. Patient started having chest pain again in the evening today which also resolved after half an hour. But at this time patient presented the urgent care center and was referred to the ER. EKG chest x-ray and cardiac markers were negative and patient is chest chest pain-free. Patient's chest pain is left-sided with pressure-like. On exam chest pain is reproducible on palpation. Denies any associated shortness of breath nausea vomiting. Had mild diaphoresis. Denies any abdominal pain dysuria or discharges or diarrhea.  Review of Systems: As presented in the history of presenting illness, rest negative.  Past Medical History  Diagnosis Date  . Depression   . Dry eyes   . Cataract    Past Surgical History  Procedure Laterality Date  . Vaginal hysterectomy    . Cataracts surg    . Pelvic floor repair    . Colonoscopy    . Tonsillectomy     Social History:  reports that she has never smoked. She has never used smokeless tobacco. She reports that she does not drink alcohol or use illicit drugs. Home. where does patient live-- Can do ADLs. Can patient participate in ADLs?  Allergies  Allergen Reactions  . Neosporin (Neomycin-Bacitracin Zn-Polymyx)     Swelling and rash  . Penicillins     swelling and rash  . Sulfa Antibiotics Swelling and Rash  . Latex     rash    Family History  Problem Relation Age of Onset  . CAD Neg Hx       Prior to Admission medications   Medication Sig Start Date End Date Taking? Authorizing Provider  ARIPiprazole (ABILIFY) 5 MG tablet  Take 2.5 mg by mouth daily.   Yes Historical Provider, MD  aspirin 81 MG tablet Take 81 mg by mouth every other day.    Yes Historical Provider, MD  Calcium Carbonate-Vit D-Min (CALCIUM 1200 PO) Take 1 tablet by mouth daily.    Yes Historical Provider, MD  cycloSPORINE (RESTASIS) 0.05 % ophthalmic emulsion Place 1 drop into both eyes 2 (two) times daily.   Yes Historical Provider, MD  escitalopram (LEXAPRO) 20 MG tablet Take 20 mg by mouth daily.   Yes Historical Provider, MD  lamoTRIgine (LAMICTAL) 100 MG tablet Take 100 mg by mouth daily.   Yes Historical Provider, MD  Multiple Vitamin (MULTIVITAMIN) tablet Take 1 tablet by mouth daily.   Yes Historical Provider, MD  Omega-3 Fatty Acids (OMEGA 3 PO) Take 2 capsules by mouth 2 (two) times daily.    Yes Historical Provider, MD   Physical Exam: Filed Vitals:   02/02/13 2049 02/02/13 2127 02/02/13 2240  BP: 94/77 118/58 116/55  Pulse: 55 67 58  Temp: 97.8 F (36.6 C)    TempSrc: Oral    Resp: 18 14 14   SpO2: 98% 99% 98%     General:  Well-developed well-nourished.  Eyes: Anicteric no pallor.  ENT: No discharge from ears eyes nose mouth.  Neck: No mass felt.  Cardiovascular: S1-S2 heard.  Respiratory: No rhonchi or crepitations.  Abdomen: Soft nontender bowel sounds present.  Skin: No rash.  Musculoskeletal: No edema.  Psychiatric: Appears  normal.  Neurologic: Alert awake oriented to time place and person. Moves all extremities.  Labs on Admission:  Basic Metabolic Panel:  Recent Labs Lab 02/02/13 2107  NA 139  K 3.9  CL 105  CO2 25  GLUCOSE 92  BUN 18  CREATININE 0.68  CALCIUM 9.1   Liver Function Tests: No results found for this basename: AST, ALT, ALKPHOS, BILITOT, PROT, ALBUMIN,  in the last 168 hours No results found for this basename: LIPASE, AMYLASE,  in the last 168 hours No results found for this basename: AMMONIA,  in the last 168 hours CBC:  Recent Labs Lab 02/02/13 2107  WBC 3.6*  HGB 12.0   HCT 35.7*  MCV 88.8  PLT 145*   Cardiac Enzymes: No results found for this basename: CKTOTAL, CKMB, CKMBINDEX, TROPONINI,  in the last 168 hours  BNP (last 3 results)  Recent Labs  02/02/13 2107  PROBNP 62.0   CBG: No results found for this basename: GLUCAP,  in the last 168 hours  Radiological Exams on Admission: Dg Chest 2 View  02/02/2013   *RADIOLOGY REPORT*  Clinical Data: Chest pain  CHEST - 2 VIEW  Comparison: None.  Findings:  Borderline enlarged cardiac silhouette and mediastinal contours. The lungs appear hyperexpanded with flattening of the bilateral hemidiaphragms and mild diffuse thickening of the pulmonary interstitium.  No focal airspace opacity.  No pleural effusion or pneumothorax.  No definite evidence of edema.  No acute osseous abnormalities.  IMPRESSION: Mild lung hyperexpansion and bronchitic change without acute cardiopulmonary disease.   Original Report Authenticated By: Tacey Ruiz, MD    EKG: Independently reviewed. Normal sinus rhythm with sinus bradycardia.  Assessment/Plan Principal Problem:   Chest pain   1. Chest pain - has both typical and atypical features. Chest pain is reproducible on deep palpation. Cycle cardiac markers. Check 2-D echo. Check d-dimer. Aspirin. When necessary nitroglycerin. 2. History of depression - continue present medications. 3. Mild leukopenia and thrombocytopenia - follow CBC with differentials.    Code Status: Full code.  Family Communication: None.  Disposition Plan: Admit for observation.    Juvenal Umar N. [facility] Pager [phone].  If 7PM-7AM, please contact night-coverage [email or web address] Password [facility] 02/02/2013, 11:03 PM

## 2013-02-02 NOTE — ED Notes (Signed)
DR. Arizona Constable EXPLAINED RESULTS OF TEST , PLAN OF CARE AND ADMISSION PLAN ON PT.

## 2013-02-02 NOTE — ED Provider Notes (Signed)
Chief Complaint:   Chief Complaint  Patient presents with  . Chest Pain    x 24 hrs off/on pain is sharp    History of Present Illness:   Sylvia Maldonado is a 72 year old female who presents today with a two-day history of recurring episodes of left pectoral chest pain with radiation down the left arm. These began yesterday around 6 PM. The episodes last about 30 minutes and can come and go throughout the evening yesterday or the day today. There no obvious precipitating factors such as exercise or exertion. They are associated with feeling lightheaded, dizzy, and short of breath. They're not associated with nausea, vomiting, or diaphoresis. She has no known cardiac disease. She denies any fever, chills, coughing, or wheezing. She does not have any history of diabetes, hypertension, cigarette smoking, or family history of heart disease. She states she has had some diarrhea and abdominal pain the last couple days.  Review of Systems:  Other than noted above, the patient denies any of the following symptoms. Systemic:  No fever, chills, sweats, or fatigue. ENT:  No nasal congestion, rhinorrhea, or sore throat. Pulmonary:  No cough, wheezing, shortness of breath, sputum production, hemoptysis. Cardiac:  No palpitations, rapid heartbeat, dizziness, presyncope or syncope. GI:  No abdominal pain, heartburn, nausea, or vomiting. Ext:  No leg pain or swelling.  PMFSH:  Past medical history, family history, social history, meds, and allergies were reviewed and updated as needed. She is allergic to sulfa, penicillin, neomycin. She takes Abilify, Restasis, Lexapro, and Lamictal. She has a history of depression and dry eyes.  Physical Exam:   Vital signs:  There were no vitals taken for this visit. Gen:  Alert, oriented, in no distress, skin warm and dry. Eye:  PERRL, lids and conjunctivas normal.  Sclera non-icteric. ENT:  Mucous membranes moist, pharynx clear. Neck:  Supple, no adenopathy or tenderness.  No  JVD. Lungs:  Clear to auscultation, no wheezes, rales or rhonchi.  No respiratory distress. Heart:  Regular rhythm.  No gallops, murmers, clicks or rubs. Chest:  No chest wall tenderness. Abdomen:  Soft, nontender, no organomegaly or mass.  Bowel sounds normal.  No pulsatile abdominal mass or bruit. Ext:  No edema.  No calf tenderness and Homann's sign negative.  Pulses full and equal. Skin:  Warm and dry.  No rash.  EKG:   Date: 02/02/2013  Rate: 58  Rhythm: sinus bradycardia  QRS Axis: normal  Intervals: normal  ST/T Wave abnormalities: normal  Conduction Disutrbances:none  Narrative Interpretation: Sinus bradycardia, low-voltage QRS.  Old EKG Reviewed: none available  Course in Urgent Care Center:   The patient was given aspirin 325 mg by mouth, IV normal saline at 50 mL per hour, oxygen 2 L per minute via nasal cannula, and monitored. She does not have pain right now, so she was not given any nitroglycerin.  Assessment:  The encounter diagnosis was Angina pectoris.  Plan:   1.  The following meds were prescribed:   New Prescriptions   No medications on file   2.  The patient was transferred to the emergency department via [facility] in stable condition.  Medical Decision Making: 72 year old female has a 2 day history of intermittent, non-exertional, left pectoral chest pain with radiation to left arm, associated with dizziness, and shortness of breath.  No known cardiac disease. EKG was within normal limits, but symptoms are highly suspicious for angina.      Reuben Likes, MD 02/02/13 2004

## 2013-02-03 DIAGNOSIS — D72819 Decreased white blood cell count, unspecified: Secondary | ICD-10-CM | POA: Diagnosis present

## 2013-02-03 DIAGNOSIS — I517 Cardiomegaly: Secondary | ICD-10-CM | POA: Diagnosis not present

## 2013-02-03 DIAGNOSIS — Z8659 Personal history of other mental and behavioral disorders: Secondary | ICD-10-CM | POA: Diagnosis not present

## 2013-02-03 DIAGNOSIS — R079 Chest pain, unspecified: Secondary | ICD-10-CM | POA: Diagnosis not present

## 2013-02-03 LAB — CBC
MCH: 29.6 pg (ref 26.0–34.0)
MCHC: 33.4 g/dL (ref 30.0–36.0)
MCV: 89 fL (ref 78.0–100.0)
Platelets: 132 10*3/uL — ABNORMAL LOW (ref 150–400)
Platelets: 149 10*3/uL — ABNORMAL LOW (ref 150–400)
RDW: 14.1 % (ref 11.5–15.5)
RDW: 14 % (ref 11.5–15.5)
WBC: 3.2 10*3/uL — ABNORMAL LOW (ref 4.0–10.5)

## 2013-02-03 LAB — BASIC METABOLIC PANEL
BUN: 13 mg/dL (ref 6–23)
Calcium: 8.9 mg/dL (ref 8.4–10.5)
Creatinine, Ser: 0.67 mg/dL (ref 0.50–1.10)
GFR calc non Af Amer: 86 mL/min — ABNORMAL LOW (ref >90)
Glucose, Bld: 93 mg/dL (ref 70–99)

## 2013-02-03 LAB — TROPONIN I
Troponin I: <0.3 ng/mL (ref <0.30)
Troponin I: <0.3 ng/mL (ref <0.30)
Troponin I: <0.3 ng/mL (ref <0.30)

## 2013-02-03 LAB — CREATININE, SERUM
Creatinine, Ser: 0.69 mg/dL (ref 0.50–1.10)
GFR calc Af Amer: >90 mL/min (ref >90)
GFR calc non Af Amer: 86 mL/min — ABNORMAL LOW (ref >90)

## 2013-02-03 NOTE — Progress Notes (Signed)
[facility] PROGRESS NOTE  Assessment/Plan: Atypical Chest pain: - Cardiac markers x 2, Sinus bradycardia. - Echo pending. - Chest pain reproducible by palpation. - cont ASA.  Leukopenia/thrombocytpenia: - Mild, ? MDS. - Monitor. Will need follow up with PCP.   History of depression - follow up as an out.   Code Status: full Family Communication: none  Disposition Plan: observation   Consultants:  none  Procedures:  echo  Antibiotics:  None  HPI/Subjective: Complaining of chest reproducible by palpations.  Objective: Filed Vitals:   02/02/13 2127 02/02/13 2240 02/02/13 2319 02/03/13 0500  BP: 118/58 116/55 140/61 106/49  Pulse: 67 58 57 59  Temp:   97.9 F (36.6 C) 98.3 F (36.8 C)  TempSrc:      Resp: 14 14 18 16   Height:   5\' 5"  (1.651 m)   Weight:   64.592 kg (142 lb 6.4 oz)   SpO2: 99% 98% 99% 97%   No intake or output data in the 24 hours ending 02/03/13 0909 Filed Weights   02/02/13 2319  Weight: 64.592 kg (142 lb 6.4 oz)    Exam:  General: Alert, awake, oriented x3, in no acute distress.  HEENT: No bruits, no goiter.  Heart: Regular rate and rhythm, without murmurs, rubs, gallops.  Lungs: Good air movement, clear to auscultation Abdomen: Soft, nontender, nondistended, positive bowel sounds.  Neuro: Grossly intact, nonfocal.   Data Reviewed: Basic Metabolic Panel:  Recent Labs Lab 02/02/13 2107 02/03/13 0001 02/03/13 0540  NA 139  --  140  K 3.9  --  3.7  CL 105  --  106  CO2 25  --  27  GLUCOSE 92  --  93  BUN 18  --  13  CREATININE 0.68 0.69 0.67  CALCIUM 9.1  --  8.9   Liver Function Tests: No results found for this basename: AST, ALT, ALKPHOS, BILITOT, PROT, ALBUMIN,  in the last 168 hours No results found for this basename: LIPASE, AMYLASE,  in the last 168 hours No results found for this basename: AMMONIA,  in the last 168 hours CBC:  Recent Labs Lab 02/02/13 2107 02/03/13 0001 02/03/13 0540  WBC 3.6*  3.2* 3.0*  HGB 12.0 11.2* 12.2  HCT 35.7* 34.1* 36.5  MCV 88.8 89.0 88.6  PLT 145* 132* 149*   Cardiac Enzymes:  Recent Labs Lab 02/03/13 0001 02/03/13 0540  TROPONINI <0.30 <0.30   BNP (last 3 results)  Recent Labs  02/02/13 2107  PROBNP 62.0   CBG: No results found for this basename: GLUCAP,  in the last 168 hours  No results found for this or any previous visit (from the past 240 hour(s)).   Studies: Dg Chest 2 View  02/02/2013   *RADIOLOGY REPORT*  Clinical Data: Chest pain  CHEST - 2 VIEW  Comparison: None.  Findings:  Borderline enlarged cardiac silhouette and mediastinal contours. The lungs appear hyperexpanded with flattening of the bilateral hemidiaphragms and mild diffuse thickening of the pulmonary interstitium.  No focal airspace opacity.  No pleural effusion or pneumothorax.  No definite evidence of edema.  No acute osseous abnormalities.  IMPRESSION: Mild lung hyperexpansion and bronchitic change without acute cardiopulmonary disease.   Original Report Authenticated By: Tacey Ruiz, MD    Scheduled Meds: . ARIPiprazole  2.5 mg Oral Daily  . aspirin EC  325 mg Oral Daily  . cycloSPORINE  1 drop Both Eyes BID  . enoxaparin (LOVENOX) injection  40 mg Subcutaneous Daily  .  escitalopram  20 mg Oral Daily  . lamoTRIgine  100 mg Oral Daily  . multivitamin with minerals  1 tablet Oral Daily  . sodium chloride  3 mL Intravenous Q12H   Continuous Infusions: . sodium chloride       Marinda Elk  [facility] Pager [phone]. If 8PM-8AM, please contact night-coverage at [email or web address], password [facility] 02/03/2013, 9:09 AM  LOS: 1 day

## 2013-02-03 NOTE — Discharge Summary (Signed)
Physician Discharge Summary  Sylvia Maldonado ZOX:[id number] DOB: 1941/03/25 DOA: 02/02/2013  PCP: Gaye Alken, MD  Admit date: 02/02/2013 Discharge date: 02/03/2013  Time spent: 32  minutes  Recommendations for Outpatient Follow-up:  1. Follow up cardiology next week for stress test. 2. Follow up with PCP check a CBC and monitor leukopenia.  Discharge Diagnoses:  Principal Problem:   Chest pain Active Problems:   Leukopenia   History of depression   Discharge Condition: stable  Diet recommendation: heart healthy  Filed Weights   02/02/13 2319  Weight: 64.592 kg (142 lb 6.4 oz)    History of present illness:  72 y.o. female history of depression further effort to the ER from the urgent care Center for chest pain. Patient started experiencing chest pain yesterday for half an hour which resolved spontaneously. Patient started having chest pain again in the evening today which also resolved after half an hour. But at this time patient presented the urgent care center and was referred to the ER. EKG chest x-ray and cardiac markers were negative and patient is chest chest pain-free. Patient's chest pain is left-sided with pressure-like. On exam chest pain is reproducible on palpation. Denies any associated shortness of breath nausea vomiting. Had mild diaphoresis. Denies any abdominal pain dysuria or discharges or diarrhea   Hospital Course:  Atypical Chest pain:  - Cardiac markers x 3, Sinus bradycardia.  - Echo showed no AS, EF 60% no diastolic heart failure.  - Chest pain reproducible by palpation.  - cont ASA.  - no widening of mediastinum, no diffuse st segment elevation on EKG, no infiltrate an PNA. - D- dimer 0.38  Leukopenia/thrombocytpenia:  - Mild, ? MDS.  - Monitor. Will need follow up with PCP.   History of depression  - follow up as an out.    Procedures: Echo:ejection fraction was in the range of 60% to 65%. Wall motion was normal; there were  no regional wall motion abnormalities. Left ventricular diastolic function parameters were normal.   Consultations:  none  Discharge Exam: Filed Vitals:   02/03/13 1045 02/03/13 1047 02/03/13 1051 02/03/13 1455  BP: 132/56 126/59 120/63 123/52  Pulse: 53 56 57 55  Temp:    98 F (36.7 C)  TempSrc:    Oral  Resp:    18  Height:      Weight:      SpO2: 98% 97% 98%     General: A&O x3 Cardiovascular: RRR Respiratory: good air movement CTA B/L  Discharge Instructions  Discharge Orders   Future Orders Complete By Expires     Diet - low sodium heart healthy  As directed     Increase activity slowly  As directed         Medication List         ARIPiprazole 5 MG tablet  Commonly known as:  ABILIFY  Take 2.5 mg by mouth daily.     aspirin 81 MG tablet  Take 81 mg by mouth every other day.     CALCIUM 1200 PO  Take 1 tablet by mouth daily.     cycloSPORINE 0.05 % ophthalmic emulsion  Commonly known as:  RESTASIS  Place 1 drop into both eyes 2 (two) times daily.     escitalopram 20 MG tablet  Commonly known as:  LEXAPRO  Take 20 mg by mouth daily.     lamoTRIgine 100 MG tablet  Commonly known as:  LAMICTAL  Take 100 mg by mouth daily.  multivitamin tablet  Take 1 tablet by mouth daily.     OMEGA 3 PO  Take 2 capsules by mouth 2 (two) times daily.       Allergies  Allergen Reactions  . Neosporin (Neomycin-Bacitracin Zn-Polymyx)     Swelling and rash  . Penicillins     swelling and rash  . Sulfa Antibiotics Swelling and Rash  . Latex     rash       Follow-up Information   Follow up with Gaye Alken, MD In 2 weeks.   Contact information:   [address] [place] [address] [phone]        The results of significant diagnostics from this hospitalization (including imaging, microbiology, ancillary and laboratory) are listed below for reference.    Significant Diagnostic Studies: Dg Chest 2 View  02/02/2013    *RADIOLOGY REPORT*  Clinical Data: Chest pain  CHEST - 2 VIEW  Comparison: None.  Findings:  Borderline enlarged cardiac silhouette and mediastinal contours. The lungs appear hyperexpanded with flattening of the bilateral hemidiaphragms and mild diffuse thickening of the pulmonary interstitium.  No focal airspace opacity.  No pleural effusion or pneumothorax.  No definite evidence of edema.  No acute osseous abnormalities.  IMPRESSION: Mild lung hyperexpansion and bronchitic change without acute cardiopulmonary disease.   Original Report Authenticated By: Tacey Ruiz, MD    Microbiology: No results found for this or any previous visit (from the past 240 hour(s)).   Labs: Basic Metabolic Panel:  Recent Labs Lab 02/02/13 2107 02/03/13 0001 02/03/13 0540  NA 139  --  140  K 3.9  --  3.7  CL 105  --  106  CO2 25  --  27  GLUCOSE 92  --  93  BUN 18  --  13  CREATININE 0.68 0.69 0.67  CALCIUM 9.1  --  8.9   Liver Function Tests: No results found for this basename: AST, ALT, ALKPHOS, BILITOT, PROT, ALBUMIN,  in the last 168 hours No results found for this basename: LIPASE, AMYLASE,  in the last 168 hours No results found for this basename: AMMONIA,  in the last 168 hours CBC:  Recent Labs Lab 02/02/13 2107 02/03/13 0001 02/03/13 0540  WBC 3.6* 3.2* 3.0*  HGB 12.0 11.2* 12.2  HCT 35.7* 34.1* 36.5  MCV 88.8 89.0 88.6  PLT 145* 132* 149*   Cardiac Enzymes:  Recent Labs Lab 02/03/13 0001 02/03/13 0540 02/03/13 1145  TROPONINI <0.30 <0.30 <0.30   BNP: BNP (last 3 results)  Recent Labs  02/02/13 2107  PROBNP 62.0   CBG: No results found for this basename: GLUCAP,  in the last 168 hours  Signed:  Marinda Elk  [facility] 02/03/2013, 3:38 PM

## 2013-02-03 NOTE — Progress Notes (Signed)
*  PRELIMINARY RESULTS* Echocardiogram 2D Echocardiogram has been performed.  Jeryl Columbia 02/03/2013, 10:05 AM

## 2013-02-05 ENCOUNTER — Other Ambulatory Visit: Payer: Self-pay | Admitting: *Deleted

## 2013-02-05 ENCOUNTER — Encounter: Payer: Self-pay | Admitting: *Deleted

## 2013-02-05 ENCOUNTER — Encounter: Payer: Self-pay | Admitting: Internal Medicine

## 2013-02-05 DIAGNOSIS — R079 Chest pain, unspecified: Secondary | ICD-10-CM | POA: Diagnosis not present

## 2013-02-05 DIAGNOSIS — R7989 Other specified abnormal findings of blood chemistry: Secondary | ICD-10-CM | POA: Diagnosis not present

## 2013-02-05 DIAGNOSIS — R159 Full incontinence of feces: Secondary | ICD-10-CM | POA: Diagnosis not present

## 2013-02-05 DIAGNOSIS — Z8544 Personal history of malignant neoplasm of other female genital organs: Secondary | ICD-10-CM | POA: Diagnosis not present

## 2013-02-12 DIAGNOSIS — F331 Major depressive disorder, recurrent, moderate: Secondary | ICD-10-CM | POA: Diagnosis not present

## 2013-02-13 DIAGNOSIS — R0789 Other chest pain: Secondary | ICD-10-CM | POA: Diagnosis not present

## 2013-02-13 DIAGNOSIS — F329 Major depressive disorder, single episode, unspecified: Secondary | ICD-10-CM | POA: Diagnosis not present

## 2013-02-16 DIAGNOSIS — F331 Major depressive disorder, recurrent, moderate: Secondary | ICD-10-CM | POA: Diagnosis not present

## 2013-02-19 DIAGNOSIS — F331 Major depressive disorder, recurrent, moderate: Secondary | ICD-10-CM | POA: Diagnosis not present

## 2013-02-23 DIAGNOSIS — F331 Major depressive disorder, recurrent, moderate: Secondary | ICD-10-CM | POA: Diagnosis not present

## 2013-02-26 DIAGNOSIS — F331 Major depressive disorder, recurrent, moderate: Secondary | ICD-10-CM | POA: Diagnosis not present

## 2013-02-27 ENCOUNTER — Encounter: Payer: Medicare Other | Admitting: Nurse Practitioner

## 2013-03-05 DIAGNOSIS — F331 Major depressive disorder, recurrent, moderate: Secondary | ICD-10-CM | POA: Diagnosis not present

## 2013-03-09 DIAGNOSIS — F331 Major depressive disorder, recurrent, moderate: Secondary | ICD-10-CM | POA: Diagnosis not present

## 2013-03-13 DIAGNOSIS — F331 Major depressive disorder, recurrent, moderate: Secondary | ICD-10-CM | POA: Diagnosis not present

## 2013-03-19 DIAGNOSIS — F331 Major depressive disorder, recurrent, moderate: Secondary | ICD-10-CM | POA: Diagnosis not present

## 2013-03-26 ENCOUNTER — Ambulatory Visit: Payer: Medicare Other | Admitting: Internal Medicine

## 2013-03-26 DIAGNOSIS — F331 Major depressive disorder, recurrent, moderate: Secondary | ICD-10-CM | POA: Diagnosis not present

## 2013-04-02 DIAGNOSIS — F331 Major depressive disorder, recurrent, moderate: Secondary | ICD-10-CM | POA: Diagnosis not present

## 2013-04-03 ENCOUNTER — Ambulatory Visit: Payer: Medicare Other | Admitting: Internal Medicine

## 2013-04-09 DIAGNOSIS — F331 Major depressive disorder, recurrent, moderate: Secondary | ICD-10-CM | POA: Diagnosis not present

## 2013-04-16 ENCOUNTER — Emergency Department (HOSPITAL_COMMUNITY): Payer: No Typology Code available for payment source

## 2013-04-16 ENCOUNTER — Emergency Department (HOSPITAL_COMMUNITY)
Admission: EM | Admit: 2013-04-16 | Discharge: 2013-04-16 | Disposition: A | Discharge status: Home / Self Care | Payer: No Typology Code available for payment source | Attending: Emergency Medicine | Admitting: Emergency Medicine

## 2013-04-16 ENCOUNTER — Encounter (HOSPITAL_COMMUNITY): Payer: Self-pay | Admitting: *Deleted

## 2013-04-16 DIAGNOSIS — M25552 Pain in left hip: Secondary | ICD-10-CM

## 2013-04-16 DIAGNOSIS — Z880 Allergy status to penicillin: Secondary | ICD-10-CM | POA: Insufficient documentation

## 2013-04-16 DIAGNOSIS — Y92410 Unspecified street and highway as the place of occurrence of the external cause: Secondary | ICD-10-CM | POA: Insufficient documentation

## 2013-04-16 DIAGNOSIS — Z79899 Other long term (current) drug therapy: Secondary | ICD-10-CM | POA: Insufficient documentation

## 2013-04-16 DIAGNOSIS — M25559 Pain in unspecified hip: Secondary | ICD-10-CM | POA: Diagnosis not present

## 2013-04-16 DIAGNOSIS — S46909A Unspecified injury of unspecified muscle, fascia and tendon at shoulder and upper arm level, unspecified arm, initial encounter: Secondary | ICD-10-CM | POA: Insufficient documentation

## 2013-04-16 DIAGNOSIS — S4980XA Other specified injuries of shoulder and upper arm, unspecified arm, initial encounter: Secondary | ICD-10-CM | POA: Diagnosis not present

## 2013-04-16 DIAGNOSIS — Z8679 Personal history of other diseases of the circulatory system: Secondary | ICD-10-CM | POA: Insufficient documentation

## 2013-04-16 DIAGNOSIS — S0993XA Unspecified injury of face, initial encounter: Secondary | ICD-10-CM | POA: Insufficient documentation

## 2013-04-16 DIAGNOSIS — F329 Major depressive disorder, single episode, unspecified: Secondary | ICD-10-CM | POA: Insufficient documentation

## 2013-04-16 DIAGNOSIS — M542 Cervicalgia: Secondary | ICD-10-CM | POA: Diagnosis not present

## 2013-04-16 DIAGNOSIS — Z8719 Personal history of other diseases of the digestive system: Secondary | ICD-10-CM | POA: Insufficient documentation

## 2013-04-16 DIAGNOSIS — S79919A Unspecified injury of unspecified hip, initial encounter: Secondary | ICD-10-CM | POA: Diagnosis not present

## 2013-04-16 DIAGNOSIS — F331 Major depressive disorder, recurrent, moderate: Secondary | ICD-10-CM | POA: Diagnosis not present

## 2013-04-16 DIAGNOSIS — Z860100 Personal history of colon polyps, unspecified: Secondary | ICD-10-CM | POA: Insufficient documentation

## 2013-04-16 DIAGNOSIS — Z91040 Latex allergy status: Secondary | ICD-10-CM | POA: Insufficient documentation

## 2013-04-16 DIAGNOSIS — M25519 Pain in unspecified shoulder: Secondary | ICD-10-CM | POA: Diagnosis not present

## 2013-04-16 DIAGNOSIS — Y9389 Activity, other specified: Secondary | ICD-10-CM | POA: Insufficient documentation

## 2013-04-16 DIAGNOSIS — F3289 Other specified depressive episodes: Secondary | ICD-10-CM | POA: Insufficient documentation

## 2013-04-16 DIAGNOSIS — Z8669 Personal history of other diseases of the nervous system and sense organs: Secondary | ICD-10-CM | POA: Insufficient documentation

## 2013-04-16 DIAGNOSIS — M25512 Pain in left shoulder: Secondary | ICD-10-CM

## 2013-04-16 MED ORDER — HYDROCODONE-ACETAMINOPHEN 5-325 MG PO TABS: 1.0000 | ORAL_TABLET | ORAL | Status: DC | PRN

## 2013-04-16 MED ORDER — HYDROCODONE-ACETAMINOPHEN 5-325 MG PO TABS
1.0000 | ORAL_TABLET | Freq: Once | ORAL | Status: AC
  Administered 2013-04-16: 1 via ORAL
  Filled 2013-04-16: qty 1

## 2013-04-16 NOTE — ED Notes (Signed)
Family at bedside. 
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
## 2013-04-18 DIAGNOSIS — M545 Low back pain, unspecified: Secondary | ICD-10-CM | POA: Diagnosis not present

## 2013-04-23 DIAGNOSIS — F331 Major depressive disorder, recurrent, moderate: Secondary | ICD-10-CM | POA: Diagnosis not present

## 2013-05-03 DIAGNOSIS — F331 Major depressive disorder, recurrent, moderate: Secondary | ICD-10-CM | POA: Diagnosis not present

## 2013-05-07 DIAGNOSIS — F331 Major depressive disorder, recurrent, moderate: Secondary | ICD-10-CM | POA: Diagnosis not present

## 2013-05-21 DIAGNOSIS — R159 Full incontinence of feces: Secondary | ICD-10-CM | POA: Diagnosis not present

## 2013-05-21 DIAGNOSIS — Z8544 Personal history of malignant neoplasm of other female genital organs: Secondary | ICD-10-CM | POA: Diagnosis not present

## 2013-06-13 ENCOUNTER — Other Ambulatory Visit: Payer: Self-pay

## 2013-06-13 DIAGNOSIS — Z1231 Encounter for screening mammogram for malignant neoplasm of breast: Secondary | ICD-10-CM

## 2013-06-18 DIAGNOSIS — F331 Major depressive disorder, recurrent, moderate: Secondary | ICD-10-CM | POA: Diagnosis not present

## 2013-06-25 DIAGNOSIS — Z85820 Personal history of malignant melanoma of skin: Secondary | ICD-10-CM | POA: Diagnosis not present

## 2013-06-25 DIAGNOSIS — F329 Major depressive disorder, single episode, unspecified: Secondary | ICD-10-CM | POA: Diagnosis not present

## 2013-06-25 DIAGNOSIS — M899 Disorder of bone, unspecified: Secondary | ICD-10-CM | POA: Diagnosis not present

## 2013-06-25 DIAGNOSIS — Z136 Encounter for screening for cardiovascular disorders: Secondary | ICD-10-CM | POA: Diagnosis not present

## 2013-06-25 DIAGNOSIS — R071 Chest pain on breathing: Secondary | ICD-10-CM | POA: Diagnosis not present

## 2013-06-25 DIAGNOSIS — Z0000 Encounter for general adult medical examination without abnormal findings: Secondary | ICD-10-CM | POA: Diagnosis not present

## 2013-06-25 DIAGNOSIS — Z1331 Encounter for screening for depression: Secondary | ICD-10-CM | POA: Diagnosis not present

## 2013-06-25 DIAGNOSIS — Z86010 Personal history of colonic polyps: Secondary | ICD-10-CM | POA: Diagnosis not present

## 2013-06-25 DIAGNOSIS — F331 Major depressive disorder, recurrent, moderate: Secondary | ICD-10-CM | POA: Diagnosis not present

## 2013-06-26 ENCOUNTER — Encounter (HOSPITAL_COMMUNITY): Payer: Self-pay | Admitting: Emergency Medicine

## 2013-06-26 ENCOUNTER — Emergency Department (HOSPITAL_COMMUNITY): Payer: Medicare Other

## 2013-06-26 DIAGNOSIS — R937 Abnormal findings on diagnostic imaging of other parts of musculoskeletal system: Secondary | ICD-10-CM | POA: Diagnosis not present

## 2013-06-26 DIAGNOSIS — Z79899 Other long term (current) drug therapy: Secondary | ICD-10-CM | POA: Insufficient documentation

## 2013-06-26 DIAGNOSIS — F329 Major depressive disorder, single episode, unspecified: Secondary | ICD-10-CM | POA: Diagnosis not present

## 2013-06-26 DIAGNOSIS — R0609 Other forms of dyspnea: Secondary | ICD-10-CM | POA: Diagnosis not present

## 2013-06-26 DIAGNOSIS — Z888 Allergy status to other drugs, medicaments and biological substances status: Secondary | ICD-10-CM | POA: Diagnosis not present

## 2013-06-26 DIAGNOSIS — Z7982 Long term (current) use of aspirin: Secondary | ICD-10-CM | POA: Insufficient documentation

## 2013-06-26 DIAGNOSIS — Z880 Allergy status to penicillin: Secondary | ICD-10-CM | POA: Diagnosis not present

## 2013-06-26 DIAGNOSIS — R0602 Shortness of breath: Secondary | ICD-10-CM | POA: Diagnosis not present

## 2013-06-26 DIAGNOSIS — Z860100 Personal history of colon polyps, unspecified: Secondary | ICD-10-CM | POA: Insufficient documentation

## 2013-06-26 DIAGNOSIS — R9431 Abnormal electrocardiogram [ECG] [EKG]: Secondary | ICD-10-CM | POA: Diagnosis not present

## 2013-06-26 DIAGNOSIS — R0989 Other specified symptoms and signs involving the circulatory and respiratory systems: Secondary | ICD-10-CM | POA: Insufficient documentation

## 2013-06-26 DIAGNOSIS — S298XXA Other specified injuries of thorax, initial encounter: Secondary | ICD-10-CM | POA: Diagnosis not present

## 2013-06-26 DIAGNOSIS — H04129 Dry eye syndrome of unspecified lacrimal gland: Secondary | ICD-10-CM | POA: Diagnosis not present

## 2013-06-26 DIAGNOSIS — S20219A Contusion of unspecified front wall of thorax, initial encounter: Secondary | ICD-10-CM | POA: Insufficient documentation

## 2013-06-26 DIAGNOSIS — R9389 Abnormal findings on diagnostic imaging of other specified body structures: Secondary | ICD-10-CM | POA: Diagnosis not present

## 2013-06-26 DIAGNOSIS — Z882 Allergy status to sulfonamides status: Secondary | ICD-10-CM | POA: Diagnosis not present

## 2013-06-26 DIAGNOSIS — Z9849 Cataract extraction status, unspecified eye: Secondary | ICD-10-CM | POA: Insufficient documentation

## 2013-06-26 DIAGNOSIS — Z8719 Personal history of other diseases of the digestive system: Secondary | ICD-10-CM | POA: Diagnosis not present

## 2013-06-26 DIAGNOSIS — W19XXXA Unspecified fall, initial encounter: Secondary | ICD-10-CM | POA: Insufficient documentation

## 2013-06-26 DIAGNOSIS — Y929 Unspecified place or not applicable: Secondary | ICD-10-CM | POA: Insufficient documentation

## 2013-06-26 DIAGNOSIS — Z91040 Latex allergy status: Secondary | ICD-10-CM | POA: Diagnosis not present

## 2013-06-26 DIAGNOSIS — Y9301 Activity, walking, marching and hiking: Secondary | ICD-10-CM | POA: Insufficient documentation

## 2013-06-26 DIAGNOSIS — F3289 Other specified depressive episodes: Secondary | ICD-10-CM | POA: Insufficient documentation

## 2013-06-26 DIAGNOSIS — R079 Chest pain, unspecified: Secondary | ICD-10-CM | POA: Diagnosis not present

## 2013-06-26 LAB — CBC
HCT: 40.3 % (ref 36.0–46.0)
Hemoglobin: 13.3 g/dL (ref 12.0–15.0)
MCH: 30 pg (ref 26.0–34.0)
Platelets: 167 10*3/uL (ref 150–400)
RBC: 4.44 MIL/uL (ref 3.87–5.11)
WBC: 4.3 10*3/uL (ref 4.0–10.5)

## 2013-06-26 LAB — BASIC METABOLIC PANEL
CO2: 27 mEq/L (ref 19–32)
Chloride: 103 mEq/L (ref 96–112)
Glucose, Bld: 116 mg/dL — ABNORMAL HIGH (ref 70–99)
Sodium: 138 mEq/L (ref 135–145)

## 2013-06-26 LAB — POCT I-STAT TROPONIN I: Troponin i, poc: 0.01 ng/mL (ref 0.00–0.08)

## 2013-06-26 MED ORDER — OXYCODONE-ACETAMINOPHEN 5-325 MG PO TABS
1.0000 | ORAL_TABLET | Freq: Once | ORAL | Status: AC
  Administered 2013-06-26: 1 via ORAL
  Filled 2013-06-26: qty 1
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
## 2013-06-27 ENCOUNTER — Emergency Department (HOSPITAL_COMMUNITY)
Admission: EM | Admit: 2013-06-27 | Discharge: 2013-06-27 | Disposition: A | Discharge status: Home / Self Care | Payer: Medicare Other | Attending: Emergency Medicine | Admitting: Emergency Medicine

## 2013-06-27 ENCOUNTER — Emergency Department (HOSPITAL_COMMUNITY): Payer: Medicare Other

## 2013-06-27 ENCOUNTER — Encounter (HOSPITAL_COMMUNITY): Payer: Self-pay | Admitting: Radiology

## 2013-06-27 DIAGNOSIS — R937 Abnormal findings on diagnostic imaging of other parts of musculoskeletal system: Secondary | ICD-10-CM

## 2013-06-27 DIAGNOSIS — S298XXA Other specified injuries of thorax, initial encounter: Secondary | ICD-10-CM | POA: Diagnosis not present

## 2013-06-27 DIAGNOSIS — S20211A Contusion of right front wall of thorax, initial encounter: Secondary | ICD-10-CM

## 2013-06-27 MED ORDER — HYDROCODONE-ACETAMINOPHEN 5-325 MG PO TABS: 1.0000 | ORAL_TABLET | Freq: Four times a day (QID) | ORAL | Status: DC | PRN

## 2013-06-27 MED ORDER — FENTANYL CITRATE 0.05 MG/ML IJ SOLN
50.0000 ug | INTRAMUSCULAR | Status: DC | PRN
  Administered 2013-06-27: 50 ug via INTRAVENOUS
  Filled 2013-06-27: qty 2

## 2013-06-27 MED ORDER — HYDROCODONE-ACETAMINOPHEN 5-325 MG PO TABS
1.0000 | ORAL_TABLET | Freq: Once | ORAL | Status: AC
  Administered 2013-06-27: 1 via ORAL
  Filled 2013-06-27: qty 1

## 2013-06-27 MED ORDER — ONDANSETRON HCL 4 MG/2ML IJ SOLN
4.0000 mg | Freq: Once | INTRAMUSCULAR | Status: AC
  Administered 2013-06-27: 4 mg via INTRAVENOUS
  Filled 2013-06-27: qty 2

## 2013-06-27 MED ORDER — IOHEXOL 350 MG/ML SOLN
100.0000 mL | Freq: Once | INTRAVENOUS | Status: AC | PRN
  Administered 2013-06-27: 100 mL via INTRAVENOUS

## 2013-06-27 NOTE — ED Notes (Signed)
Pt given incentive spirometer and explained how to use it. Pt verbalized understanding and demonstrated proper usage.

## 2013-06-27 NOTE — ED Notes (Signed)
Dr. Opitz at bedside. 

## 2013-06-27 NOTE — ED Provider Notes (Signed)
CSN: [id number]     Arrival date & time 06/26/13  2221 History   First MD Initiated Contact with Patient 06/27/13 0209     Chief Complaint  Patient presents with  . Chest Pain  . Shortness of Breath   (Consider location/radiation/quality/duration/timing/severity/associated sxs/prior Treatment) HPI History per patient. Right lower rib pain, hurts to breathe in with severe pain having some dyspnea tonight. Patient fell over a week ago while hiking. She fell forward landing on her face mostly. She had some champagne and bruising but that has gone away. About 3 days later developed this rib pain and over the course next few days got better. Tonight severe pain without radiation. No dominant pain. No nausea vomiting or diarrhea. No cough. No fevers or chills. No known alleviating factors. No leg pain or leg swelling.  No history of DVT or PE. Past Medical History  Diagnosis Date  . Depression   . Dry eyes   . Cataract   . Diverticulosis   . Hx of adenomatous colonic polyps   . Internal hemorrhoids    Past Surgical History  Procedure Laterality Date  . Vaginal hysterectomy    . Cataracts surg    . Pelvic floor repair    . Colonoscopy    . Tonsillectomy    . Eye surgery     Family History  Problem Relation Age of Onset  . CAD Neg Hx    History  Substance Use Topics  . Smoking status: Never Smoker   . Smokeless tobacco: Never Used  . Alcohol Use: No   OB History   Grav Para Term Preterm Abortions TAB SAB Ect Mult Living   3 3 3       3      Review of Systems  Constitutional: Negative for fever and chills.  Respiratory: Positive for shortness of breath. Negative for cough.   Cardiovascular: Positive for chest pain.  Gastrointestinal: Negative for abdominal pain.  Genitourinary: Negative for dysuria.  Musculoskeletal: Negative for back pain, neck pain and neck stiffness.  Skin: Negative for rash.  Neurological: Negative for headaches.  All other systems reviewed and are  negative.    Allergies  Neosporin; Penicillins; Sulfa antibiotics; and Latex  Home Medications   Current Outpatient Rx  Name  Route  Sig  Dispense  Refill  . ARIPiprazole (ABILIFY) 5 MG tablet   Oral   Take 2.5 mg by mouth daily.         Marland Kitchen aspirin 81 MG tablet   Oral   Take 81 mg by mouth every other day.          . cycloSPORINE (RESTASIS) 0.05 % ophthalmic emulsion   Both Eyes   Place 1 drop into both eyes 2 (two) times daily.         Marland Kitchen escitalopram (LEXAPRO) 20 MG tablet   Oral   Take 20 mg by mouth daily.         Marland Kitchen lamoTRIgine (LAMICTAL) 100 MG tablet   Oral   Take 100 mg by mouth at bedtime.          . Omega-3 Fatty Acids (OMEGA 3 PO)   Oral   Take 2 capsules by mouth 2 (two) times daily.           BP 120/46  Pulse 53  Temp(Src) 97.8 F (36.6 C) (Oral)  Resp 17  Wt 142 lb 2 oz (64.467 kg)  SpO2 96% Physical Exam  Constitutional: She is oriented to person,  place, and time. She appears well-developed and well-nourished.  HENT:  Head: Normocephalic and atraumatic.  Eyes: EOM are normal. Pupils are equal, round, and reactive to light.  Neck: Neck supple.  Cardiovascular: Normal rate, regular rhythm and intact distal pulses.   Pulmonary/Chest: Effort normal and breath sounds normal. No respiratory distress.  Tender right anterior lateral lower ribs. No crepitus. No rash.  Abdominal: Soft. Bowel sounds are normal. She exhibits no distension. There is no tenderness. There is no rebound and no guarding.  No right upper quadrant tenderness and no peritonitis.  Musculoskeletal: Normal range of motion. She exhibits no edema and no tenderness.  Neurological: She is alert and oriented to person, place, and time. No cranial nerve deficit.  Skin: Skin is warm and dry.    ED Course  Procedures (including critical care time) Labs Review Labs Reviewed  BASIC METABOLIC PANEL - Abnormal; Notable for the following:    Glucose, Bld 116 (*)    BUN 28 (*)     GFR calc non Af Amer 85 (*)    All other components within normal limits  CBC  POCT I-STAT TROPONIN I   Imaging Review Dg Chest 2 View  06/26/2013   CLINICAL DATA:  Fall, right rib pain.  EXAM: CHEST  2 VIEW  COMPARISON:  Chest radiograph February 02, 2013  FINDINGS: Cardiac silhouette is upper limits of normal, mediastinal silhouette is nonsuspicious, mildly calcified aortic knob. Very mild chronic interstitial changes, similar without pleural effusions or focal consolidations. On the lateral radiograph, 12 mm nodule is seen projecting posterior to the aortic arch on the lateral radiograph, not well characterized on the frontal radiograph. No pneumothorax.  Soft tissue planes and included osseous structures are nonsuspicious.  IMPRESSION: Borderline cardiomegaly, no acute pulmonary process. Mild chronic interstitial changes.  12 mm nodule projects posterior to the aortic arch on the lateral radiograph, this would be better characterized on CT of the chest with contrast as clinically indicated.   Electronically Signed   By: Awilda Metro   On: 06/26/2013 23:37   Ct Angio Chest Pe W/cm &/or Wo Cm  06/27/2013   CLINICAL DATA:  Fall, difficulty breathing.  EXAM: CT ANGIOGRAPHY CHEST WITH CONTRAST  TECHNIQUE: Multidetector CT imaging of the chest was performed using the standard protocol during bolus administration of intravenous contrast. Multiplanar CT image reconstructions including MIPs were obtained to evaluate the vascular anatomy.  CONTRAST:  100 cc of iodinated contrast (concentration and brand name not provided)  COMPARISON:  Chest radiograph June 26, 2013 and chest radiograph February 02, 2013.  FINDINGS: Mild respiratory motion degraded examination. Main pulmonary artery is not enlarged. No pulmonary arterial filling defects to the level of the subsegmental branches.  The heart appears mildly enlarged, pericardium is unremarkable. Mildly ectatic ascending aorta without discrete aneurysm, mild  calcific atherosclerosis of the aorta.  No pulmonary nodules, masses, pleural effusions. Minimal peripheral consolidation in left lung base, coronal 46/99. Tracheobronchial tree is patent, minimal bronchial wall thickening, no pneumothorax.  Included view of the abdomen is unremarkable. 9 mm sclerotic lesion within the anterior inferior endplate of T4 vertebral body, corresponding to the nodule seen on prior radiograph.  Review of the MIP images confirms the above findings.  IMPRESSION: Mild respiratory motion degraded examination, no pulmonary embolism.  Minimal consolidation left lung base, considering history of trauma this could reflect contusion (recommend correlation with point tenderness), less likely pneumonia. No overlying rib fracture.  9 mm sclerotic lesion within the T4 vertebral body,  as this may be new from prior chest radiograph February 02, 2013, further evaluation with personal history of cancer and possible bone scan recommended.   Electronically Signed   By: Awilda Metro   On: 06/27/2013 04:25    EKG Interpretation    Date/Time:  Tuesday June 26 2013 22:27:16 EST Ventricular Rate:  63 PR Interval:  174 QRS Duration: 76 QT Interval:  408 QTC Calculation: 417 R Axis:   -6 Text Interpretation:  Normal sinus rhythm Nonspecific ST and T wave abnormality Abnormal ECG No significant change since last tracing Confirmed by Danial Hlavac  MD, Sheria Rosello ([id number]) on 06/27/2013 3:16:05 AM          IV fentanyl. IV Zofran. Chest x-ray reviewed as above and CT scan ordered.  CT scan reviewed as above with patient. Her pain is improving, would like a pain pill to go home. She will follow up with her primary care physician regarding T4 sclerotic lesion. And sent as per order provided. Plan discharge home with prescription for hydrocodone. Rib contusion precautions provided.  MDM  Diagnosis: Right rib contusion, T4 sclerotic lesion  Evaluated with EKG, labs and imaging reviewed as above. Patient  understands need for close followup of sclerotic lesion Pain controlled with IV narcotics Vital signs and nurses notes reviewed and considered    Sunnie Nielsen, MD 06/27/13 [phone]

## 2013-06-27 NOTE — ED Notes (Signed)
Pt requesting to speak with Dr. Dr. Dierdre Highman made aware.

## 2013-06-27 NOTE — ED Notes (Signed)
Pt given incentive spirometer and explained how to use it. Pt verbalized understanding and demonstrated proper usage of incentive spirometer.

## 2013-06-27 NOTE — ED Notes (Signed)
Pt A&Ox4, ambulatory at discharge, no distress noted, verbalizing no complaints at this time.

## 2013-06-27 NOTE — ED Notes (Signed)
Pts friend Arline Asp is driving pt home.

## 2013-07-02 DIAGNOSIS — F331 Major depressive disorder, recurrent, moderate: Secondary | ICD-10-CM | POA: Diagnosis not present

## 2013-07-04 DIAGNOSIS — M25569 Pain in unspecified knee: Secondary | ICD-10-CM | POA: Diagnosis not present

## 2013-07-04 DIAGNOSIS — R079 Chest pain, unspecified: Secondary | ICD-10-CM | POA: Diagnosis not present

## 2013-07-09 DIAGNOSIS — F331 Major depressive disorder, recurrent, moderate: Secondary | ICD-10-CM | POA: Diagnosis not present

## 2013-07-16 DIAGNOSIS — F331 Major depressive disorder, recurrent, moderate: Secondary | ICD-10-CM | POA: Diagnosis not present

## 2013-07-19 ENCOUNTER — Ambulatory Visit
Admission: RE | Admit: 2013-07-19 | Discharge: 2013-07-19 | Disposition: A | Discharge status: Home / Self Care | Payer: Medicare Other | Source: Ambulatory Visit

## 2013-07-19 DIAGNOSIS — Z1231 Encounter for screening mammogram for malignant neoplasm of breast: Secondary | ICD-10-CM | POA: Diagnosis not present

## 2013-07-23 DIAGNOSIS — F331 Major depressive disorder, recurrent, moderate: Secondary | ICD-10-CM | POA: Diagnosis not present

## 2013-07-25 DIAGNOSIS — L219 Seborrheic dermatitis, unspecified: Secondary | ICD-10-CM | POA: Diagnosis not present

## 2013-07-30 DIAGNOSIS — F331 Major depressive disorder, recurrent, moderate: Secondary | ICD-10-CM | POA: Diagnosis not present

## 2013-08-06 DIAGNOSIS — F331 Major depressive disorder, recurrent, moderate: Secondary | ICD-10-CM | POA: Diagnosis not present

## 2013-08-08 DIAGNOSIS — M25569 Pain in unspecified knee: Secondary | ICD-10-CM | POA: Diagnosis not present

## 2013-08-08 DIAGNOSIS — R079 Chest pain, unspecified: Secondary | ICD-10-CM | POA: Diagnosis not present

## 2013-08-10 DIAGNOSIS — F331 Major depressive disorder, recurrent, moderate: Secondary | ICD-10-CM | POA: Diagnosis not present

## 2013-08-13 DIAGNOSIS — F331 Major depressive disorder, recurrent, moderate: Secondary | ICD-10-CM | POA: Diagnosis not present

## 2013-08-15 ENCOUNTER — Ambulatory Visit (HOSPITAL_COMMUNITY): Payer: Medicare Other | Admitting: Psychiatry

## 2013-08-20 DIAGNOSIS — F331 Major depressive disorder, recurrent, moderate: Secondary | ICD-10-CM | POA: Diagnosis not present

## 2013-08-22 ENCOUNTER — Encounter (INDEPENDENT_AMBULATORY_CARE_PROVIDER_SITE_OTHER): Payer: Self-pay

## 2013-08-22 ENCOUNTER — Ambulatory Visit (INDEPENDENT_AMBULATORY_CARE_PROVIDER_SITE_OTHER): Payer: Medicare Other | Admitting: Psychiatry

## 2013-08-22 DIAGNOSIS — F339 Major depressive disorder, recurrent, unspecified: Secondary | ICD-10-CM

## 2013-08-22 DIAGNOSIS — F332 Major depressive disorder, recurrent severe without psychotic features: Secondary | ICD-10-CM
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
## 2013-09-03 DIAGNOSIS — F331 Major depressive disorder, recurrent, moderate: Secondary | ICD-10-CM | POA: Diagnosis not present

## 2013-09-10 DIAGNOSIS — F331 Major depressive disorder, recurrent, moderate: Secondary | ICD-10-CM | POA: Diagnosis not present

## 2013-09-14 DIAGNOSIS — F331 Major depressive disorder, recurrent, moderate: Secondary | ICD-10-CM | POA: Diagnosis not present

## 2013-09-19 ENCOUNTER — Other Ambulatory Visit: Payer: Self-pay | Admitting: Family Medicine

## 2013-09-19 ENCOUNTER — Ambulatory Visit
Admission: RE | Admit: 2013-09-19 | Discharge: 2013-09-19 | Disposition: A | Discharge status: Home / Self Care | Payer: Medicare Other | Source: Ambulatory Visit | Attending: Family Medicine | Admitting: Family Medicine

## 2013-09-19 DIAGNOSIS — Z961 Presence of intraocular lens: Secondary | ICD-10-CM | POA: Diagnosis not present

## 2013-09-19 DIAGNOSIS — R9389 Abnormal findings on diagnostic imaging of other specified body structures: Secondary | ICD-10-CM

## 2013-09-19 DIAGNOSIS — Z9849 Cataract extraction status, unspecified eye: Secondary | ICD-10-CM | POA: Diagnosis not present

## 2013-09-19 DIAGNOSIS — M539 Dorsopathy, unspecified: Secondary | ICD-10-CM | POA: Diagnosis not present

## 2013-09-20 DIAGNOSIS — F331 Major depressive disorder, recurrent, moderate: Secondary | ICD-10-CM | POA: Diagnosis not present

## 2013-09-21 DIAGNOSIS — Z8544 Personal history of malignant neoplasm of other female genital organs: Secondary | ICD-10-CM | POA: Diagnosis not present

## 2013-09-24 DIAGNOSIS — F331 Major depressive disorder, recurrent, moderate: Secondary | ICD-10-CM | POA: Diagnosis not present

## 2013-09-26 DIAGNOSIS — F331 Major depressive disorder, recurrent, moderate: Secondary | ICD-10-CM | POA: Diagnosis not present

## 2013-10-01 DIAGNOSIS — F331 Major depressive disorder, recurrent, moderate: Secondary | ICD-10-CM | POA: Diagnosis not present

## 2013-10-05 DIAGNOSIS — F331 Major depressive disorder, recurrent, moderate: Secondary | ICD-10-CM | POA: Diagnosis not present

## 2013-10-08 DIAGNOSIS — F331 Major depressive disorder, recurrent, moderate: Secondary | ICD-10-CM | POA: Diagnosis not present

## 2013-10-12 DIAGNOSIS — F331 Major depressive disorder, recurrent, moderate: Secondary | ICD-10-CM | POA: Diagnosis not present

## 2013-10-15 DIAGNOSIS — F331 Major depressive disorder, recurrent, moderate: Secondary | ICD-10-CM | POA: Diagnosis not present

## 2013-10-18 DIAGNOSIS — F331 Major depressive disorder, recurrent, moderate: Secondary | ICD-10-CM | POA: Diagnosis not present

## 2013-10-22 DIAGNOSIS — F331 Major depressive disorder, recurrent, moderate: Secondary | ICD-10-CM | POA: Diagnosis not present

## 2013-10-24 ENCOUNTER — Ambulatory Visit (INDEPENDENT_AMBULATORY_CARE_PROVIDER_SITE_OTHER): Payer: Medicare Other | Admitting: Psychiatry

## 2013-10-24 VITALS — BP 110/50 | HR 57 | Ht 65.5 in | Wt 135.6 lb

## 2013-10-24 DIAGNOSIS — F332 Major depressive disorder, recurrent severe without psychotic features: Secondary | ICD-10-CM

## 2013-10-24 MED ORDER — ESCITALOPRAM OXALATE 20 MG PO TABS: 20.0000 mg | ORAL_TABLET | Freq: Every day | ORAL | Status: DC

## 2013-10-24 MED ORDER — BUPROPION HCL ER (XL) 150 MG PO TB24: ORAL_TABLET | ORAL | Status: DC

## 2013-10-24 NOTE — Progress Notes (Signed)
[facility] MD Progress Note  10/24/2013 3:03 PM Sylvia Maldonado  MRN:  [id number] Subjective:  Worse Today the patient shares that over the last month or so her depression is worse. It is now more persistent. At one point after I saw her approximately 2 months ago I asked her to discontinue her Abilify. She her self has restarted the Abilify understandably. Unfortunately she doesn't believe it has any effect. At this time the patient is sleeping normally for her. Her appetite is distinctly reduced. Her energy level is low. She now is having problems thinking concentrating. Interestingly however she is reading which is new for her. She wants TV and she enjoys painting. The patient is not suicidal. She did make a suicide attempt over 30 years ago. The patient also describes tension headache. As a temporal headache that is new in the last month. She has no other neurological symptoms. Her vision is fine and she has no motor or sensory abnormalities. The patient continues in one-to-one therapy with [facility]. It should be noted the patient describes fairly clearly the concept of having seasonal affective disorder. She says that the winters are much worse. Diagnosis:   DSM5: Schizophrenia Disorders:   Obsessive-Compulsive Disorders:   Trauma-Stressor Disorders:   Substance/Addictive Disorders:   Depressive Disorders:  Major Depressive Disorder - Moderate (296.22) Total Time spent with patient: 30 minutes  Axis I: Major Depression, Recurrent severe  ADL's:  Intact  Sleep: Fair  Appetite:  Poor  Suicidal Ideation:  no Homicidal Ideation:  none AEB (as evidenced by):  Psychiatric Specialty Exam: Physical Exam  ROS  Blood pressure 110/50, pulse 57, height 5' 5.5" (1.664 m), weight 135 lb 9.6 oz (61.508 kg).Body mass index is 22.21 kg/(m^2).  General Appearance: Casual  Eye Contact::  Good  Speech:  Clear and Coherent  Volume:  Normal  Mood:  Euthymic  Affect:  Appropriate  Thought  Process:  Coherent  Orientation:  Full (Time, Place, and Person)  Thought Content:  WDL  Suicidal Thoughts:  No  Homicidal Thoughts:  No  Memory:  NA  Judgement:  Good  Insight:  Fair  Psychomotor Activity:  Normal  Concentration:  Fair  Recall:  Good  Fund of Knowledge:Good  Language: Good  Akathisia:  No  Handed:  Right  AIMS (if indicated):     Assets:  Desire for Improvement  Sleep:      Musculoskeletal: Strength & Muscle Tone:  Gait & Station:  Patient leans:   Current Medications: Current Outpatient Prescriptions  Medication Sig Dispense Refill  . aspirin 81 MG tablet Take 81 mg by mouth every other day.       Marland Kitchen buPROPion (WELLBUTRIN XL) 150 MG 24 hr tablet 1 qam  For 1 week then 2  qam  60 tablet  5  . cycloSPORINE (RESTASIS) 0.05 % ophthalmic emulsion Place 1 drop into both eyes 2 (two) times daily.      Marland Kitchen escitalopram (LEXAPRO) 20 MG tablet Take 1 tablet (20 mg total) by mouth daily.  30 tablet  5  . HYDROcodone-acetaminophen (NORCO/VICODIN) 5-325 MG per tablet Take 1 tablet by mouth every 6 (six) hours as needed for moderate pain.  15 tablet  0  . Omega-3 Fatty Acids (OMEGA 3 PO) Take 2 capsules by mouth 2 (two) times daily.        No current facility-administered medications for this visit.    Lab Results: No results found for this or any previous visit (from the past 58  hour(s)).  Physical Findings: AIMS:  , ,  ,  ,    CIWA:    COWS:     Treatment Plan Summary: At this time we shall discontinue her Lamictal. Is not clear why she was on this agent. She's never been manic. At this time she reveals to me that her Abilify calls her over $300. At this time I will discontinue her Abilify. The patient continues to take Lexapro 20 mg. When she attempted to switch her to the cheaper Celexa she claimed it didn't work. I will continue her Lexapro as is. Today we she'll begin her on Wellbutrin and built a 300 mg. This patient she'll return to see me in 2 months. The  patient also shares that she has a lot of anxiety. I would first like her to try the Wellbutrin with Lexapro but if her anxiety is in better and her attention headaches continue I would go ahead and give her Ativan in the future.  Plan:  Medical Decision Making Problem Points:  Established problem, worsening (2) Data Points:  Review of medication regiment & side effects (2)  I certify that inpatient services furnished can reasonably be expected to improve the patient's condition.   Berneta Sages Carl Bleecker 10/24/2013, 3:03 PM

## 2013-10-26 DIAGNOSIS — F331 Major depressive disorder, recurrent, moderate: Secondary | ICD-10-CM | POA: Diagnosis not present

## 2013-10-29 DIAGNOSIS — F331 Major depressive disorder, recurrent, moderate: Secondary | ICD-10-CM | POA: Diagnosis not present

## 2013-10-31 DIAGNOSIS — F331 Major depressive disorder, recurrent, moderate: Secondary | ICD-10-CM | POA: Diagnosis not present

## 2013-11-05 DIAGNOSIS — F331 Major depressive disorder, recurrent, moderate: Secondary | ICD-10-CM | POA: Diagnosis not present

## 2013-11-07 ENCOUNTER — Telehealth (HOSPITAL_COMMUNITY): Payer: Self-pay | Admitting: *Deleted

## 2013-11-07 NOTE — Telephone Encounter (Signed)
Contacted patient at her request: Patient states started Wellbutrin on 4/15.Not feeling better-feels even more depressed.Advised pt that information would be given to MD.Also told pt that medication can take up to 6 weeks to work for some people

## 2013-11-08 NOTE — Telephone Encounter (Signed)
Contacted pt: Apologized for not returning call yesterday.MD did not get back with answer until after hours.Per EN:IDPOEUMPNT can take a while to work, give it time if possible.If starting to feel much worse,contact office-may need to be seen before scheduled appt.

## 2013-11-09 DIAGNOSIS — F331 Major depressive disorder, recurrent, moderate: Secondary | ICD-10-CM | POA: Diagnosis not present

## 2013-11-12 ENCOUNTER — Telehealth (HOSPITAL_COMMUNITY): Payer: Self-pay | Admitting: *Deleted

## 2013-11-12 NOTE — Telephone Encounter (Signed)
VM left Friday 1640-VM recv'd 5/4 @ 0844:Thinks the way she was feeling was related to increasing the Wellbutrin to 2/day.Hands/voice are shaky.Has headache-but not sure if all related to medication.Going to decrease to 1/day over weekend and see if better.Will call Monday

## 2013-11-12 NOTE — Telephone Encounter (Signed)
Patient left VM: [id number] a little better today.Will keep office in the loop.If anything changes and she feels worse, will contact office.

## 2013-11-12 NOTE — Telephone Encounter (Signed)
VM recv'd 8:50: Call as soon as possible Dialed pt # [id number] after VM recv'd [id number] to determine concerns.No answer.Advised pt that would call again.  VM recv'd [id number]:Therapist Arvil Chaco, LCSW] called on behalf of pt.Have been trying to get in touch with On Call provider covering for Dr. Casimiro Needle for past 3 days. Sent fax to office.Please call patient as soon as possible  Contacted Ms. Huprich 0900 to listen to concerns regarding pt. States pt at her office this morning at 7am with concerns.States she faxed those concerns to office.(Received fax in this office) States pt had contacted her several times over weekend regarding how she was feeling.Ms. Claudie Fisherman called [facility] number.They did not give her name of MD on call for  Dr.Plovsky.She states they told her pt could go to ED if safety was a concern. Ms Huprich states all Voice Mails for Outpatient state if some has a medical emergency they should go to the ED, but not who to contact if they are having a psychiatric emergency.She states pt evaluations and concerns in fax she sent to outpatient office.Ms. Claudie Fisherman stated that someone needed to be on call and that she did not know why the staff she reached at [facility] did not know. Advised Ms. Huprich that patient will be contacted. 5/7/ @ 0907:No answer at patient number.Advised pt that would call again. 5/4 @ 0915:Left message on Dr. Karen Chafe VM at [phone] x [phone] with pt contact information and brief summary of situation 5/4 @ 1045: Patient came to [facility] OP office.States has not slept or been able to eat all weekend.Depression has increased.Does not have thought opf self harm or plan for self harm. States did not want to go to ED because, "I'm in good health and knew the symptoms were because of the medicine. Did not decrease Wellbutrin as stated in message left Friday 5/1,has continued to take 2/day.Wants to talk with MD as soon as possible 5/4 @ 1048: Left message on MD cell phone with pt contact information and  asked if possible to call while pt in office.Pt states she will wait in waiting room to see if MD calls back so that she can talk with him. 5/4 @ 1114: Contacted [facility] - staff member states MD in office today,seeing patients. Left message on Dr. Karen Chafe VM at [phone] x [phone] with pt contact information and brief summary of situation. Asked Dr.Plovsky if he wanted to see pt on Wednesday to contact office and time can be arranged.  5/4 @ 1120: Advised pt to return home to receive call from MD as his VM states he calls patients at 12n and 5p.Patient states she is safe and able to return to her home

## 2013-11-14 ENCOUNTER — Telehealth (HOSPITAL_COMMUNITY): Payer: Self-pay | Admitting: Psychiatry

## 2013-11-14 MED ORDER — VENLAFAXINE HCL ER 75 MG PO CP24: 75.0000 mg | ORAL_CAPSULE | Freq: Every day | ORAL | Status: DC

## 2013-11-14 NOTE — Telephone Encounter (Signed)
Today I spoke with this patient. It is evident that Abilify is way too expensive but on the other hand she or he has 3 month of it right now. She has started back. I've spoken to this individual over the past week and when we initially spoke she was feeling persistently worsening depression. The patient says she sleeping okay and initially claimed that she wasn't eating well but now she says she forces herself. The patient is concentrating well does not describe worthlessness is not suicidal. In essence the only distinct symptoms she has a bit of major depression is a persistent depressed mood state. She has not anhedonic. She does describe a sense of worthlessness and continues to do see a therapist on a regular basis. At this time our interventions will be for her to discontinue her Lexapro and to begin 75 mg of Effexor XR. The patient has stopped her Wellbutrin and has less of its side effects of sweating tremulousness and anxiety. In some ways her side effects are gone now but she still feels excessively depressed. The patient agreed to switch her Lexapro to Effexor to continue her Abilify and to see me in approximately 3 weeks at her next scheduled appointment.

## 2013-11-16 DIAGNOSIS — F331 Major depressive disorder, recurrent, moderate: Secondary | ICD-10-CM | POA: Diagnosis not present

## 2013-11-19 DIAGNOSIS — F331 Major depressive disorder, recurrent, moderate: Secondary | ICD-10-CM | POA: Diagnosis not present

## 2013-11-26 DIAGNOSIS — F331 Major depressive disorder, recurrent, moderate: Secondary | ICD-10-CM | POA: Diagnosis not present

## 2013-12-03 DIAGNOSIS — F331 Major depressive disorder, recurrent, moderate: Secondary | ICD-10-CM | POA: Diagnosis not present

## 2013-12-10 DIAGNOSIS — F331 Major depressive disorder, recurrent, moderate: Secondary | ICD-10-CM | POA: Diagnosis not present

## 2013-12-17 DIAGNOSIS — F331 Major depressive disorder, recurrent, moderate: Secondary | ICD-10-CM | POA: Diagnosis not present

## 2013-12-25 DIAGNOSIS — F331 Major depressive disorder, recurrent, moderate: Secondary | ICD-10-CM | POA: Diagnosis not present

## 2013-12-26 ENCOUNTER — Ambulatory Visit (INDEPENDENT_AMBULATORY_CARE_PROVIDER_SITE_OTHER): Payer: Medicare Other | Admitting: Psychiatry

## 2013-12-26 VITALS — BP 125/61 | HR 67 | Ht 65.5 in | Wt 135.4 lb

## 2013-12-26 DIAGNOSIS — Z85828 Personal history of other malignant neoplasm of skin: Secondary | ICD-10-CM | POA: Diagnosis not present

## 2013-12-26 DIAGNOSIS — L819 Disorder of pigmentation, unspecified: Secondary | ICD-10-CM | POA: Diagnosis not present

## 2013-12-26 DIAGNOSIS — F332 Major depressive disorder, recurrent severe without psychotic features: Secondary | ICD-10-CM | POA: Diagnosis not present

## 2013-12-26 DIAGNOSIS — D239 Other benign neoplasm of skin, unspecified: Secondary | ICD-10-CM | POA: Diagnosis not present

## 2013-12-26 DIAGNOSIS — Z85820 Personal history of malignant melanoma of skin: Secondary | ICD-10-CM | POA: Diagnosis not present

## 2013-12-26 DIAGNOSIS — L821 Other seborrheic keratosis: Secondary | ICD-10-CM | POA: Diagnosis not present

## 2013-12-26 DIAGNOSIS — Z808 Family history of malignant neoplasm of other organs or systems: Secondary | ICD-10-CM | POA: Diagnosis not present

## 2013-12-26 MED ORDER — ARIPIPRAZOLE 5 MG PO TABS
5.0000 mg | ORAL_TABLET | Freq: Every day | ORAL | Status: DC
Start: 2013-12-26 — End: 2014-04-17

## 2013-12-26 MED ORDER — VENLAFAXINE HCL ER 75 MG PO CP24: 75.0000 mg | ORAL_CAPSULE | Freq: Every day | ORAL | Status: DC

## 2013-12-26 NOTE — Progress Notes (Signed)
[facility] MD Progress Note  12/26/2013 3:32 PM Sylvia Maldonado  MRN:  [id number] Subjective:  Much better Interview the patient went back to taking Abilify despite the fact that it was very expensive. It did not help however switch her to Celexa did not work and he went back to [employer]. But even a Lexapro she made phone contact with [place] and we decided to add Wellbutrin to the Lexapro. Unfortunately it was disastrous. On Wellbutrin she got extremely agitated and restless and very uncomfortable. Over that time we discontinued both the Lexapro and Wellbutrin and begin her on 75 mg of Effexor X. are. Today the patient says she is much improved. His back is sleeping and eating well. She has good energy. She is not agitated or anxious. She can think and concentrate without problems. The headache that she was bothered by his gone at this time. The patient is busy and active doing many things including yoga spinning class etc. The patient spends time with her daughter and her 2 granddaughters who are age 9 and 18. The patient's ultimate wishes to be back to [place] but that doesn't seem or listed at this time. She also is bothered great by the small square footage that she lives in now 500 ft. at this time the patient is very healthy and is exercising a great deal. She still opened and the idea of a romantic relationship but she has not had any success finding such an individual. The patient feels dramatically improved. We discussed the issue of Abilify costing her $300 she is very bothered by this. She can barely afford this. She actually takes half of a 5 mg pill so she actually discharged approximately $150 a month. For her this is too expensive as well. We talked about the ID that potentially she did try to come off of it but she was very frightened do that. We talked about changing to Seroquel but she still anxious about doing that. It should be noted the patient now is getting Abilify which is generic. She does not  know the exact cost at this time. Nonetheless the patient is happy taking these medications and continues with her therapist. Diagnosis:   DSM5: Schizophrenia Disorders:   Obsessive-Compulsive Disorders:   Trauma-Stressor Disorders:   Substance/Addictive Disorders:   Depressive Disorders:  Major Depressive Disorder - Moderate (296.22) Total Time spent with patient: 30 minutes  Axis I: Major Depression, Recurrent severe  ADL's:  Intact  Sleep: Good  Appetite:  Good  Suicidal Ideation:  no Homicidal Ideation:  none AEB (as evidenced by):  Psychiatric Specialty Exam: Physical Exam  ROS  Blood pressure 125/61, pulse 67, height 5' 5.5" (1.664 m), weight 135 lb 6.4 oz (61.417 kg).Body mass index is 22.18 kg/(m^2).  General Appearance: Casual  Eye Contact::  Good  Speech:  Clear and Coherent  Volume:  Normal  Mood:  Negative  Affect:  NA  Thought Process:  Coherent  Orientation:  Full (Time, Place, and Person)  Thought Content:  WDL  Suicidal Thoughts:  No  Homicidal Thoughts:  No  Memory:  NA  Judgement:  Good  Insight:  Good  Psychomotor Activity:  Normal  Concentration:  Good  Recall:  Good  Fund of Knowledge:Good  Language: Good  Akathisia:  No  Handed:  Right  AIMS (if indicated):     Assets:  Desire for Improvement  Sleep:      Musculoskeletal: Strength & Muscle Tone:  Gait & Station:  Patient leans:  Current Medications: Current Outpatient Prescriptions  Medication Sig Dispense Refill  . ARIPiprazole (ABILIFY) 5 MG tablet Take 1 tablet (5 mg total) by mouth daily.  30 tablet  2  . aspirin 81 MG tablet Take 81 mg by mouth every other day.       Marland Kitchen buPROPion (WELLBUTRIN XL) 150 MG 24 hr tablet 1 qam  For 1 week then 2  qam  60 tablet  5  . cycloSPORINE (RESTASIS) 0.05 % ophthalmic emulsion Place 1 drop into both eyes 2 (two) times daily.      Marland Kitchen escitalopram (LEXAPRO) 20 MG tablet Take 1 tablet (20 mg total) by mouth daily.  30 tablet  5  .  HYDROcodone-acetaminophen (NORCO/VICODIN) 5-325 MG per tablet Take 1 tablet by mouth every 6 (six) hours as needed for moderate pain.  15 tablet  0  . Omega-3 Fatty Acids (OMEGA 3 PO) Take 2 capsules by mouth 2 (two) times daily.       Marland Kitchen venlafaxine XR (EFFEXOR XR) 75 MG 24 hr capsule Take 1 capsule (75 mg total) by mouth daily with breakfast.  30 capsule  5   No current facility-administered medications for this visit.    Lab Results: No results found for this or any previous visit (from the past 48 hour(s)).  Physical Findings: AIMS:  , ,  ,  ,    CIWA:    COWS:     Treatment Plan Summary: At this time we will continue with her Effexor 75 mg extra hard morning, Abilify 2.5 mg a day and to continue in therapy. At this time her anxiety is well-controlled and her mood is stable. This patient to return to see me in 2 months. At that time we'll once again look at the issue of the expense of Abilify. Again will discuss the option of changing to a low-dose of Seroquel which I believe will be just as beneficial.  Plan:  Medical Decision Making Problem Points:  Established problem, stable/improving (1) Data Points:  Review of new medications or change in dosage (2)  I certify that inpatient services furnished can reasonably be expected to improve the patient's condition.   [name] 12/26/2013, 3:32 PM

## 2013-12-31 DIAGNOSIS — F331 Major depressive disorder, recurrent, moderate: Secondary | ICD-10-CM | POA: Diagnosis not present

## 2014-01-07 DIAGNOSIS — F331 Major depressive disorder, recurrent, moderate: Secondary | ICD-10-CM | POA: Diagnosis not present

## 2014-01-14 DIAGNOSIS — F331 Major depressive disorder, recurrent, moderate: Secondary | ICD-10-CM | POA: Diagnosis not present

## 2014-01-15 DIAGNOSIS — Z01419 Encounter for gynecological examination (general) (routine) without abnormal findings: Secondary | ICD-10-CM | POA: Diagnosis not present

## 2014-01-15 DIAGNOSIS — Z0000 Encounter for general adult medical examination without abnormal findings: Secondary | ICD-10-CM | POA: Diagnosis not present

## 2014-01-21 DIAGNOSIS — F331 Major depressive disorder, recurrent, moderate: Secondary | ICD-10-CM | POA: Diagnosis not present

## 2014-01-25 DIAGNOSIS — F331 Major depressive disorder, recurrent, moderate: Secondary | ICD-10-CM | POA: Diagnosis not present

## 2014-01-28 DIAGNOSIS — F331 Major depressive disorder, recurrent, moderate: Secondary | ICD-10-CM | POA: Diagnosis not present

## 2014-02-07 DIAGNOSIS — F331 Major depressive disorder, recurrent, moderate: Secondary | ICD-10-CM | POA: Diagnosis not present

## 2014-02-11 DIAGNOSIS — F331 Major depressive disorder, recurrent, moderate: Secondary | ICD-10-CM | POA: Diagnosis not present

## 2014-02-18 DIAGNOSIS — F331 Major depressive disorder, recurrent, moderate: Secondary | ICD-10-CM | POA: Diagnosis not present

## 2014-02-25 DIAGNOSIS — F331 Major depressive disorder, recurrent, moderate: Secondary | ICD-10-CM | POA: Diagnosis not present

## 2014-03-04 DIAGNOSIS — F331 Major depressive disorder, recurrent, moderate: Secondary | ICD-10-CM | POA: Diagnosis not present

## 2014-03-06 ENCOUNTER — Ambulatory Visit (INDEPENDENT_AMBULATORY_CARE_PROVIDER_SITE_OTHER): Payer: Medicare Other | Admitting: Psychiatry

## 2014-03-06 VITALS — BP 114/67 | HR 69 | Ht 65.5 in | Wt 132.6 lb

## 2014-03-06 DIAGNOSIS — F332 Major depressive disorder, recurrent severe without psychotic features: Secondary | ICD-10-CM | POA: Diagnosis not present

## 2014-03-06 MED ORDER — ESCITALOPRAM OXALATE 20 MG PO TABS: 20.0000 mg | ORAL_TABLET | Freq: Every day | ORAL | Status: DC

## 2014-03-06 MED ORDER — VENLAFAXINE HCL ER 75 MG PO CP24: 75.0000 mg | ORAL_CAPSULE | Freq: Every day | ORAL | Status: DC

## 2014-03-06 NOTE — Progress Notes (Signed)
[facility] MD Progress Note  03/06/2014 2:08 PM Sylvia Maldonado  MRN:  [id number] Subjective: Feels well Today the patient says her spirits are good. Everything is well. The patient did go out of date. Unfortunately the date was a disaster. Halfway through the date he announce that he has a girlfriend who is simply out of town. He also drank excessively and crossed over the median when he missed his turn. The patient is losing faith in the Concept of meeting a new man. The patient says she is sleeping and eating well. She is glad to be well and not depressed. She is very resistant to changing any of her medicines. She's had had over a half a dozen episodes of deep depression. The patient is very thankful to be well. She says however her difficult times often occur in the winter. She is somewhat anxious about the upcoming winter. The patient stays very active. She is in great shape. She's no physical illnesses. She does get a spinning classes and aerobics. The patient seizure daughter often lives close to or and her 2 grandchildren were at age 31 and 32. The patient feels positive about things except for dating. Diagnosis:   DSM5: Schizophrenia Disorders:   Obsessive-Compulsive Disorders:   Trauma-Stressor Disorders:   Substance/Addictive Disorders:   Depressive Disorders:  Major Depressive Disorder - Mild (296.21) Total Time spent with patient: 30 minutes  Axis I: Major Depression, Recurrent severe  ADL's:  Intact  Sleep: Good  Appetite:  Good  Suicidal Ideation:  no Homicidal Ideation:  nonme AEB (as evidenced by):  Psychiatric Specialty Exam: Physical Exam  ROS  Blood pressure 114/67, pulse 69, height 5' 5.5" (1.664 m), weight 132 lb 9.6 oz (60.147 kg).Body mass index is 21.72 kg/(m^2).  General Appearance: Casual  Eye Contact::  Good  Speech:  Clear and Coherent  Volume:  Normal  Mood:  NA  Affect:  Appropriate  Thought Process:  Coherent  Orientation:  Full (Time, Place, and  Person)  Thought Content:  WDL  Suicidal Thoughts:  No  Homicidal Thoughts:  No  Memory:  NA  Judgement:  Good  Insight:  Good  Psychomotor Activity:  Normal  Concentration:  Good  Recall:  Good  Fund of Knowledge:Good  Language: Good  Akathisia:  No  Handed:  Right  AIMS (if indicated):     Assets:  Communication Skills  Sleep:      Musculoskeletal: Strength & Muscle Tone:  Gait & Station:  Patient leans:   Current Medications: Current Outpatient Prescriptions  Medication Sig Dispense Refill  . ARIPiprazole (ABILIFY) 5 MG tablet Take 1 tablet (5 mg total) by mouth daily.  30 tablet  2  . aspirin 81 MG tablet Take 81 mg by mouth every other day.       . cycloSPORINE (RESTASIS) 0.05 % ophthalmic emulsion Place 1 drop into both eyes 2 (two) times daily.      Marland Kitchen escitalopram (LEXAPRO) 20 MG tablet Take 1 tablet (20 mg total) by mouth daily.  30 tablet  5  . HYDROcodone-acetaminophen (NORCO/VICODIN) 5-325 MG per tablet Take 1 tablet by mouth every 6 (six) hours as needed for moderate pain.  15 tablet  0  . Omega-3 Fatty Acids (OMEGA 3 PO) Take 2 capsules by mouth 2 (two) times daily.       Marland Kitchen venlafaxine XR (EFFEXOR XR) 75 MG 24 hr capsule Take 1 capsule (75 mg total) by mouth daily with breakfast.  30 capsule  5  No current facility-administered medications for this visit.    Lab Results: No results found for this or any previous visit (from the past 48 hour(s)).  Physical Findings: AIMS:  , ,  ,  ,    CIWA:    COWS:     Treatment Plan Summary: At this time the patient will continue taking Effexor 75 mg XR each morning. They will also continue taking Abilify 2.5 mg. She still yet to note expensive this agent. She has left over Abilify that she's had for while and hopefully it'll last until she sees me in 3 months. In my opinion I think this patient should stay on Effexor and Abilify indefinitely. She is no TD on exam. She had an aims scale that was negative. Patient denies  any symptoms related to side effects at all. She takes her medicine predictably and consistently. The patient was instructed never around out of her Effexor. The patient will continue seeing her therapist Louretta Parma Hupridge.  Plan:  Medical Decision Making Problem Points:  Established problem, stable/improving (1) Data Points:  Review of medication regiment & side effects (2)  I certify that inpatient services furnished can reasonably be expected to improve the patient's condition.   Alandis Bluemel, [place] 03/06/2014, 2:08 PM

## 2014-03-11 DIAGNOSIS — F331 Major depressive disorder, recurrent, moderate: Secondary | ICD-10-CM | POA: Diagnosis not present

## 2014-03-18 DIAGNOSIS — F331 Major depressive disorder, recurrent, moderate: Secondary | ICD-10-CM | POA: Diagnosis not present

## 2014-03-25 DIAGNOSIS — F331 Major depressive disorder, recurrent, moderate: Secondary | ICD-10-CM | POA: Diagnosis not present

## 2014-04-01 DIAGNOSIS — F331 Major depressive disorder, recurrent, moderate: Secondary | ICD-10-CM | POA: Diagnosis not present

## 2014-04-08 DIAGNOSIS — F331 Major depressive disorder, recurrent, moderate: Secondary | ICD-10-CM | POA: Diagnosis not present

## 2014-04-15 DIAGNOSIS — F331 Major depressive disorder, recurrent, moderate: Secondary | ICD-10-CM | POA: Diagnosis not present

## 2014-04-17 ENCOUNTER — Ambulatory Visit (INDEPENDENT_AMBULATORY_CARE_PROVIDER_SITE_OTHER): Payer: Medicare Other | Admitting: Psychiatry

## 2014-04-17 VITALS — BP 118/69 | HR 64 | Ht 65.0 in | Wt 130.0 lb

## 2014-04-17 DIAGNOSIS — F330 Major depressive disorder, recurrent, mild: Secondary | ICD-10-CM

## 2014-04-17 DIAGNOSIS — F329 Major depressive disorder, single episode, unspecified: Secondary | ICD-10-CM | POA: Diagnosis not present

## 2014-04-17 MED ORDER — VENLAFAXINE HCL ER 75 MG PO CP24: 75.0000 mg | ORAL_CAPSULE | Freq: Every day | ORAL | Status: DC

## 2014-04-17 MED ORDER — ARIPIPRAZOLE 5 MG PO TABS: 5.0000 mg | ORAL_TABLET | Freq: Every day | ORAL | Status: DC

## 2014-04-17 NOTE — Progress Notes (Signed)
[facility] MD Progress Note  04/17/2014 2:28 PM Sylvia Maldonado  MRN:  [id number] Subjective: Doing well Today the patient knowledge is that in fact the Abilify will be extremely expensive. She does not work to find out a way to reduce the cost. She's caught her insurance company is willing to center special form that we fill out should give her reduction. Abilify has also become generic which is helpful for her. The patient says that the initial pulse was $1000 a month. Today we talked about the possibility of stopping the Abilify when she runs out of it in January. I think is a reasonable option. I would see her at the end of January after she be off of it for a month. At this time the patient denies being depressed. She is sleeping and eating well. She's got good energy. She can concentrate and feels good about her life.  this patient is not depressed. She's not using any alcohol. She takes her medicines as prescribed. The patient denies any chest pain or shortness of breath. She denies any neurological symptoms. DSM5: Schizophrenia Disorders:   Obsessive-Compulsive Disorders:   Trauma-Stressor Disorders:   Substance/Addictive Disorders:   Depressive Disorders:  Major Depressive Disorder - Mild (296.21) Total Time spent with patient: 15 minutes  Axis I: Major Depression, single episode  ADL's:  Intact  Sleep: Good  Appetite:  Good  Suicidal Ideation:  no Homicidal Ideation:  none AEB (as evidenced by):  Psychiatric Specialty Exam: Physical Exam  ROS  There were no vitals taken for this visit.There is no weight on file to calculate BMI.  General Appearance: Casual  Eye Contact::  Good  Speech:  Clear and Coherent  Volume:  Normal  Mood:  Euthymic  Affect:  Appropriate  Thought Process:  Goal Directed  Orientation:  Full (Time, Place, and Person)  Thought Content:  WDL  Suicidal Thoughts:  No  Homicidal Thoughts:  No  Memory:  NA  Judgement:  NA  Insight:  Good   Psychomotor Activity:  Normal  Concentration:  Good  Recall:  Good  Fund of Knowledge:Good  Language: Good  Akathisia:  No  Handed:  Right  AIMS (if indicated):     Assets:  Desire for Improvement  Sleep:      Musculoskeletal: Strength & Muscle Tone:  Gait & Station:  Patient leans:   Current Medications: Current Outpatient Prescriptions  Medication Sig Dispense Refill  . ARIPiprazole (ABILIFY) 5 MG tablet Take 1 tablet (5 mg total) by mouth daily.  30 tablet  2  . aspirin 81 MG tablet Take 81 mg by mouth every other day.       . cycloSPORINE (RESTASIS) 0.05 % ophthalmic emulsion Place 1 drop into both eyes 2 (two) times daily.      Marland Kitchen escitalopram (LEXAPRO) 20 MG tablet Take 1 tablet (20 mg total) by mouth daily.  30 tablet  5  . HYDROcodone-acetaminophen (NORCO/VICODIN) 5-325 MG per tablet Take 1 tablet by mouth every 6 (six) hours as needed for moderate pain.  15 tablet  0  . Omega-3 Fatty Acids (OMEGA 3 PO) Take 2 capsules by mouth 2 (two) times daily.       Marland Kitchen venlafaxine XR (EFFEXOR XR) 75 MG 24 hr capsule Take 1 capsule (75 mg total) by mouth daily with breakfast.  30 capsule  5   No current facility-administered medications for this visit.    Lab Results: No results found for this or any previous visit (from the  past 48 hour(s)).  Physical Findings: AIMS:  , ,  ,  ,    CIWA:    COWS:     Treatment Plan Summary: At this time the patient will continue taking Effexor 75 mg extra in the morning and continue her Abilify 2.5 mg. The patient continue in her psychotherapy with [facility]up[facility]. this patient will come to see me in late January. By that time she will have been off of Abilify for about 3 weeks. The patient will make an attempt to try to get a form for reduction in her medications from her insurance company. At this time the patient is very stable.   Plan:  Medical Decision Making Problem Points:  Established problem, stable/improving (1) Data Points:  Review  of medication regiment & side effects (2)  I certify that inpatient services furnished can reasonably be expected to improve the patient's condition.   Sirena Riddle, [place] 04/17/2014, 2:28 PM

## 2014-04-19 DIAGNOSIS — H6590 Unspecified nonsuppurative otitis media, unspecified ear: Secondary | ICD-10-CM | POA: Diagnosis not present

## 2014-04-19 DIAGNOSIS — H6120 Impacted cerumen, unspecified ear: Secondary | ICD-10-CM | POA: Diagnosis not present

## 2014-04-23 DIAGNOSIS — F331 Major depressive disorder, recurrent, moderate: Secondary | ICD-10-CM | POA: Diagnosis not present

## 2014-04-25 DIAGNOSIS — J069 Acute upper respiratory infection, unspecified: Secondary | ICD-10-CM | POA: Diagnosis not present

## 2014-04-25 DIAGNOSIS — H6120 Impacted cerumen, unspecified ear: Secondary | ICD-10-CM | POA: Diagnosis not present

## 2014-04-26 DIAGNOSIS — F331 Major depressive disorder, recurrent, moderate: Secondary | ICD-10-CM | POA: Diagnosis not present

## 2014-04-29 ENCOUNTER — Encounter (HOSPITAL_COMMUNITY): Payer: Self-pay | Admitting: *Deleted

## 2014-04-29 DIAGNOSIS — F331 Major depressive disorder, recurrent, moderate: Secondary | ICD-10-CM | POA: Diagnosis not present

## 2014-04-29 NOTE — Progress Notes (Signed)
Received faxed note from [employer] on list of covered drugs. No PA needed.

## 2014-05-06 DIAGNOSIS — F331 Major depressive disorder, recurrent, moderate: Secondary | ICD-10-CM | POA: Diagnosis not present

## 2014-05-10 ENCOUNTER — Telehealth (HOSPITAL_COMMUNITY): Payer: Self-pay | Admitting: *Deleted

## 2014-05-10 NOTE — Telephone Encounter (Signed)
Called [employer] due to receiving fax stating aripiprazole was denied due to not having supporting statement for member initiated prior authorization. Spoke with [employer] who states their system is currently down and she will manually enter information I have her and someone will call back with decision. Unable to look up any information about a prior authorization not being required.

## 2014-05-13 ENCOUNTER — Encounter (HOSPITAL_COMMUNITY): Payer: Self-pay | Admitting: Radiology

## 2014-05-13 DIAGNOSIS — F331 Major depressive disorder, recurrent, moderate: Secondary | ICD-10-CM | POA: Diagnosis not present

## 2014-05-17 ENCOUNTER — Telehealth (HOSPITAL_COMMUNITY): Payer: Self-pay | Admitting: *Deleted

## 2014-05-17 NOTE — Telephone Encounter (Signed)
Called to ask about denial for Prior authorization for Abilify. [employer] would not take any further information stating a denial was made on 10/29 and will need to have an appeal done at this point.

## 2014-05-20 DIAGNOSIS — F331 Major depressive disorder, recurrent, moderate: Secondary | ICD-10-CM | POA: Diagnosis not present

## 2014-05-22 ENCOUNTER — Ambulatory Visit (HOSPITAL_COMMUNITY): Payer: Self-pay | Admitting: Psychiatry

## 2014-06-03 DIAGNOSIS — F331 Major depressive disorder, recurrent, moderate: Secondary | ICD-10-CM | POA: Diagnosis not present

## 2014-06-10 DIAGNOSIS — F331 Major depressive disorder, recurrent, moderate: Secondary | ICD-10-CM | POA: Diagnosis not present

## 2014-06-14 ENCOUNTER — Telehealth: Payer: Self-pay | Admitting: Internal Medicine

## 2014-06-14 MED ORDER — DICYCLOMINE HCL 10 MG PO CAPS: 10.0000 mg | ORAL_CAPSULE | Freq: Three times a day (TID) | ORAL | Status: DC

## 2014-06-14 NOTE — Telephone Encounter (Signed)
Patient advised of Dr Nichola Sizer recommendations. Rx sent to pharmacy.

## 2014-06-14 NOTE — Telephone Encounter (Signed)
Could you , please put her on Dicyclomine 10 mg, #60, 1 po tid ac, 1 refill, last colon 2014- IBS?

## 2014-06-14 NOTE — Telephone Encounter (Signed)
Spoke with patient and she states she has been incontinent of stool for a long time but it is getting worse. States she has stool oozing out. Reports stool is diarrhea. 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 Offered OV with APP next week but she only wants to see Dr. Olevia Perches. Scheduled on 07/16/14 at 8:45 AM with Dr. Olevia Perches.

## 2014-06-17 DIAGNOSIS — F331 Major depressive disorder, recurrent, moderate: Secondary | ICD-10-CM | POA: Diagnosis not present

## 2014-06-18 ENCOUNTER — Ambulatory Visit (INDEPENDENT_AMBULATORY_CARE_PROVIDER_SITE_OTHER): Payer: Medicare Other | Admitting: Internal Medicine

## 2014-06-18 ENCOUNTER — Other Ambulatory Visit (INDEPENDENT_AMBULATORY_CARE_PROVIDER_SITE_OTHER): Payer: Medicare Other

## 2014-06-18 ENCOUNTER — Encounter: Payer: Self-pay | Admitting: Internal Medicine

## 2014-06-18 VITALS — BP 110/66 | HR 72 | Ht 65.0 in | Wt 131.0 lb

## 2014-06-18 DIAGNOSIS — R159 Full incontinence of feces: Secondary | ICD-10-CM

## 2014-06-18 DIAGNOSIS — R197 Diarrhea, unspecified: Secondary | ICD-10-CM

## 2014-06-18 LAB — SEDIMENTATION RATE: Sed Rate: 12 mm/hr (ref 0–22)

## 2014-06-18 MED ORDER — DIPHENOXYLATE-ATROPINE 2.5-0.025 MG PO TABS: 1.0000 | ORAL_TABLET | Freq: Every day | ORAL | Status: DC

## 2014-06-18 MED ORDER — DICYCLOMINE HCL 20 MG PO TABS: 20.0000 mg | ORAL_TABLET | Freq: Two times a day (BID) | ORAL | Status: DC

## 2014-06-18 NOTE — Patient Instructions (Signed)
We have sent the following medications to your pharmacy for you to pick up at your convenience: Lomotil Bentyl  Your physician has requested that you go to the basement for the following lab work before leaving today: Celiac 10 Panel, Sed Rate  CC:Dr Leighton Ruff

## 2014-06-18 NOTE — Progress Notes (Signed)
Sylvia Maldonado [facility] 08-29-1940 [id number]  Note: This dictation was prepared with Dragon digital system. Any transcriptional errors that result from this procedure are unintentional.   History of Present Illness:  This is a 73 year old white female with  Frequent soft  stools and leakage of stool which occurs on a daily basis without abdominal pain or blood per rectum. Her last colonoscopy in June 2014 showed mild diverticulosis in the sigmoid colon. These symptoms have been present for the past 2 years but have become worse since her daughter was diagnosed with breast cancer. She had similar episodes while living in [place] 15 years ago when she was under a lot of stress. She underwent a colonoscopy in [place] in 2001, 2004 and 2009 with findings of adenomatous and hyperplastic polyps. She has a history of depression for which she takes Abilify 5 mg daily and Effexor 75 mg daily.    Past Medical History  Diagnosis Date  . Depression   . Dry eyes   . Cataract   . Diverticulosis   . Hx of adenomatous colonic polyps   . Internal hemorrhoids     Past Surgical History  Procedure Laterality Date  . Vaginal hysterectomy    . Cataracts surg    . Pelvic floor repair    . Colonoscopy    . Tonsillectomy    . Eye surgery      Allergies  Allergen Reactions  . Neosporin [Neomycin-Bacitracin Zn-Polymyx] Swelling and Rash    Swelling and rash  . Penicillins Swelling and Rash    swelling and rash  . Sulfa Antibiotics Swelling and Rash  . Latex Rash    rash    Family history and social history have been reviewed.  Review of Systems: Negative for nocturnal diarrhea, abdominal pain or rectal bleeding  The remainder of the 10 point ROS is negative except as outlined in the H&P  Physical Exam: General Appearance Well developed, in no distress Eyes  Non icteric  HEENT  Non traumatic, normocephalic  Mouth No lesion, tongue papillated, no cheilosis Neck Supple without adenopathy, thyroid  not enlarged, no carotid bruits, no JVD Lungs Clear to auscultation bilaterally COR Normal S1, normal S2, regular rhythm, no murmur, quiet precordium Abdomen soft, nontender. Somewhat increased bowel sounds. Liver edge at costal margin. No tympany Rectal normal rectal sphincter tone. Soft yellow Hemoccult-negative stool Extremities  No pedal edema Skin No lesions Neurological Alert and oriented x 3 Psychological Normal mood and affect  Assessment and Plan:  Problem #79 73 year old white female who is up-to-date on colonoscopy. She is having symptoms of irritable bowel syndrome, but  We need to consider  possibly lymphocytic colitis. She had a similar episode of frequent stools in the past when under stress. She has been improved on dicyclomine 10 mg 3 times a day. We will increase the dose to 20 mg twice a day and take Lomotil one at bedtime. I have offered anxiolytic medications but she does not want to take Xanax or Valium. She will continue a high-fiber diet and call us back with an update when she returns from her trip to visit her daughter with cancer in [place].Delfin Edis 06/18/2014

## 2014-06-19 LAB — CELIAC PANEL 10
Endomysial Screen: NEGATIVE
GLIADIN IGA: 2 U/mL (ref <20)
Gliadin IgG: 1 U/mL (ref <20)
IgA: 58 mg/dL — ABNORMAL LOW (ref 69–380)
TISSUE TRANSGLUT AB: 1 U/mL (ref <6)
Tissue Transglutaminase Ab, IgA: 1 U/mL (ref <4)

## 2014-06-24 DIAGNOSIS — F331 Major depressive disorder, recurrent, moderate: Secondary | ICD-10-CM | POA: Diagnosis not present

## 2014-07-01 DIAGNOSIS — F331 Major depressive disorder, recurrent, moderate: Secondary | ICD-10-CM | POA: Diagnosis not present

## 2014-07-09 DIAGNOSIS — F331 Major depressive disorder, recurrent, moderate: Secondary | ICD-10-CM | POA: Diagnosis not present

## 2014-07-15 DIAGNOSIS — F331 Major depressive disorder, recurrent, moderate: Secondary | ICD-10-CM | POA: Diagnosis not present

## 2014-07-16 ENCOUNTER — Ambulatory Visit: Payer: Self-pay | Admitting: Internal Medicine

## 2014-07-16 DIAGNOSIS — L814 Other melanin hyperpigmentation: Secondary | ICD-10-CM | POA: Diagnosis not present

## 2014-07-16 DIAGNOSIS — Z85828 Personal history of other malignant neoplasm of skin: Secondary | ICD-10-CM | POA: Diagnosis not present

## 2014-07-16 DIAGNOSIS — D239 Other benign neoplasm of skin, unspecified: Secondary | ICD-10-CM | POA: Diagnosis not present

## 2014-07-16 DIAGNOSIS — Z85820 Personal history of malignant melanoma of skin: Secondary | ICD-10-CM | POA: Diagnosis not present

## 2014-07-17 ENCOUNTER — Telehealth (HOSPITAL_COMMUNITY): Payer: Self-pay

## 2014-07-17 MED ORDER — ESCITALOPRAM OXALATE 20 MG PO TABS: 20.0000 mg | ORAL_TABLET | Freq: Every day | ORAL | Status: DC

## 2014-07-17 NOTE — Telephone Encounter (Signed)
Today I spoke to this patient and read the note from her therapist. Apparently the patient has been experiencing some mild chronic GI problems which she attributes to actually to the low-dose Abilify. She said her GI discomfort it got much worse when I started her Effexor on her last visit. The patient appropriately called this office and we spoke. The patient is discontinue both the Abilify and Effexor without a problem and restarted on the Lexapro. This is perfectly appropriate. She takes Lexapro 20 mg and actually feels much better. Her GI symptoms have resolved. This patient will continue in therapy and is actually doing well. She'll see her next scheduled appointment. I've called in her Lexapro.

## 2014-07-22 DIAGNOSIS — F331 Major depressive disorder, recurrent, moderate: Secondary | ICD-10-CM | POA: Diagnosis not present

## 2014-07-29 DIAGNOSIS — F331 Major depressive disorder, recurrent, moderate: Secondary | ICD-10-CM | POA: Diagnosis not present

## 2014-07-30 DIAGNOSIS — Z23 Encounter for immunization: Secondary | ICD-10-CM | POA: Diagnosis not present

## 2014-07-30 DIAGNOSIS — M8580 Other specified disorders of bone density and structure, unspecified site: Secondary | ICD-10-CM | POA: Diagnosis not present

## 2014-07-30 DIAGNOSIS — Z0000 Encounter for general adult medical examination without abnormal findings: Secondary | ICD-10-CM | POA: Diagnosis not present

## 2014-07-30 DIAGNOSIS — Z136 Encounter for screening for cardiovascular disorders: Secondary | ICD-10-CM | POA: Diagnosis not present

## 2014-07-31 ENCOUNTER — Ambulatory Visit (INDEPENDENT_AMBULATORY_CARE_PROVIDER_SITE_OTHER): Payer: Medicare Other | Admitting: Psychiatry

## 2014-07-31 VITALS — BP 128/69 | HR 62 | Ht 65.0 in | Wt 130.8 lb

## 2014-07-31 DIAGNOSIS — F331 Major depressive disorder, recurrent, moderate: Secondary | ICD-10-CM

## 2014-07-31 DIAGNOSIS — F329 Major depressive disorder, single episode, unspecified: Secondary | ICD-10-CM | POA: Diagnosis not present

## 2014-07-31 MED ORDER — ESCITALOPRAM OXALATE 20 MG PO TABS: 20.0000 mg | ORAL_TABLET | Freq: Every day | ORAL | Status: DC

## 2014-07-31 NOTE — Progress Notes (Signed)
[facility] MD Progress Note  07/31/2014 2:37 PM Sylvia Maldonado  MRN:  [id number] Subjective:  Feels great The patient is off Abilify and off Effexor and feels much better. Her mood is good. She has a lot of energy. She takes just Lexapro 20 mg a day. She is sleeping and eating well. She is enjoying life. She is active physically energized. She paints with oils and goes to church. Patient shows no evidence of depression. Her anxiety is low. She has good relationships with her family. She drinks no alcohol. She's not suicidal. At this time she is psychiatrically very stable. She is calm focused active. Today she asked if she can possibly get her medications from her primary care doctor, Dr. Leighton Ruff. I shared with her they be no problem in fact I would call Dr. Drema Dallas and clarify that the patient could switch her care getting Lexapro from Dr. Drema Dallas and that I would always be available in the future if there are problems. The patient was pleased with this arrangement. The patient at this time is very stable. Principal Problem: @PPROB @ Diagnosis:   Patient Active Problem List   Diagnosis Date Noted  . Diarrhea [R19.7] 06/18/2014  . Leukopenia [D72.819] 02/03/2013  . History of depression [Z86.59] 02/03/2013  . Chest pain [R07.9] 02/02/2013   Total Time spent with patient: 30 minutes   Past Medical History:  Past Medical History  Diagnosis Date  . Depression   . Dry eyes   . Cataract   . Diverticulosis   . Hx of adenomatous colonic polyps   . Internal hemorrhoids     Past Surgical History  Procedure Laterality Date  . Vaginal hysterectomy    . Cataracts surg    . Pelvic floor repair    . Colonoscopy    . Tonsillectomy    . Eye surgery     Family History:  Family History  Problem Relation Age of Onset  . CAD Neg Hx    Social History:  History  Alcohol Use No     History  Drug Use No    History   Social History  . Marital Status: Single    Spouse Name: N/A     Number of Children: N/A  . Years of Education: N/A   Social History Main Topics  . Smoking status: Never Smoker   . Smokeless tobacco: Never Used  . Alcohol Use: No  . Drug Use: No  . Sexual Activity: Not on file   Other Topics Concern  . Not on file   Social History Narrative   Additional History:    Sleep: Good  Appetite:  Good   Assessment:   Musculoskeletal: Strength & Muscle Tone:  Gait & Station:  Patient leans:    Psychiatric Specialty Exam: Physical Exam  ROS  Blood pressure 128/69, pulse 62, height 5\' 5"  (1.651 m), weight 130 lb 12.8 oz (59.33 kg).Body mass index is 21.77 kg/(m^2).  General Appearance: Fairly Groomed  Engineer, water::  Good  Speech:  Clear and Coherent  Volume:  Normal  Mood:  Euthymic  Affect:  NA and Appropriate  Thought Process:  Coherent  Orientation:  Full (Time, Place, and Person)  Thought Content:  WDL  Suicidal Thoughts:  No  Homicidal Thoughts:  No  Memory:  NA  Judgement:  Good  Insight:  Good  Psychomotor Activity:  Normal  Concentration:  Good  Recall:  Good  Fund of Knowledge:Good  Language: Good  Akathisia:  No  Handed:  Right  AIMS (if indicated):     Assets:  Desire for Improvement  ADL's:  Intact  Cognition: WNL  Sleep:        Current Medications: Current Outpatient Prescriptions  Medication Sig Dispense Refill  . ARIPiprazole (ABILIFY) 5 MG tablet Take 1 tablet (5 mg total) by mouth daily. (Patient taking differently: Take 2.5 mg by mouth daily. ) 30 tablet 2  . aspirin 81 MG tablet Take 81 mg by mouth every other day.     . Cholecalciferol (VITAMIN D-3) 1000 UNITS CAPS Take 2,000 Units by mouth daily.    . cycloSPORINE (RESTASIS) 0.05 % ophthalmic emulsion Place 1 drop into both eyes daily.     Marland Kitchen dicyclomine (BENTYL) 20 MG tablet Take 1 tablet (20 mg total) by mouth 2 (two) times daily. 60 tablet 2  . diphenoxylate-atropine (LOMOTIL) 2.5-0.025 MG per tablet Take 1 tablet by mouth at bedtime. 30 tablet 2   . escitalopram (LEXAPRO) 20 MG tablet Take 1 tablet (20 mg total) by mouth daily. 30 tablet 6  . Omega-3 Fatty Acids (OMEGA 3 PO) Take 2 capsules by mouth 2 (two) times daily.     Marland Kitchen venlafaxine XR (EFFEXOR XR) 75 MG 24 hr capsule Take 1 capsule (75 mg total) by mouth daily with breakfast. 30 capsule 5   No current facility-administered medications for this visit.    Lab Results: No results found for this or any previous visit (from the past 48 hour(s)).  Physical Findings: AIMS:  , ,  ,  ,    CIWA:    COWS:     Treatment Plan Summary: At this time the patient will continue taking Lexapro 20 mg in the morning. Today went over the pros and cons of taking this medicine and she seemed to understand that. She's not gaining weight from it and it does not seem to have an impact on her sex drive. Today I called Dr. Drema Dallas office and told him that I would be available for them if there are any other questions about this patient and her Lexapro. I shared with the patient that I thought she could take this indefinitely. The patient was not having any complications from Abilify other than its expense. Apparently the Effexor was causing some GI problems which are now gone taking Lexapro. This patient was not given a return appointment but told that she could call back for another appointment if she needed to.   Medical Decision Making:  Established Problem, Stable/Improving (1) Problem Points:  Review of last therapy session (1) Data Points:  Review of medication regiment & side effects (2)    Sylvia Maldonado 07/31/2014, 2:37 PM

## 2014-08-05 DIAGNOSIS — F331 Major depressive disorder, recurrent, moderate: Secondary | ICD-10-CM | POA: Diagnosis not present

## 2014-08-08 DIAGNOSIS — F331 Major depressive disorder, recurrent, moderate: Secondary | ICD-10-CM | POA: Diagnosis not present

## 2014-08-12 DIAGNOSIS — F331 Major depressive disorder, recurrent, moderate: Secondary | ICD-10-CM | POA: Diagnosis not present

## 2014-08-14 DIAGNOSIS — F331 Major depressive disorder, recurrent, moderate: Secondary | ICD-10-CM | POA: Diagnosis not present

## 2014-08-16 DIAGNOSIS — J069 Acute upper respiratory infection, unspecified: Secondary | ICD-10-CM | POA: Diagnosis not present

## 2014-08-19 DIAGNOSIS — F331 Major depressive disorder, recurrent, moderate: Secondary | ICD-10-CM | POA: Diagnosis not present

## 2014-08-20 DIAGNOSIS — J209 Acute bronchitis, unspecified: Secondary | ICD-10-CM | POA: Diagnosis not present

## 2014-08-20 DIAGNOSIS — F329 Major depressive disorder, single episode, unspecified: Secondary | ICD-10-CM | POA: Diagnosis not present

## 2014-08-28 ENCOUNTER — Ambulatory Visit (INDEPENDENT_AMBULATORY_CARE_PROVIDER_SITE_OTHER): Payer: Medicare Other | Admitting: Psychiatry

## 2014-08-28 ENCOUNTER — Encounter (HOSPITAL_COMMUNITY): Payer: Self-pay | Admitting: Psychiatry

## 2014-08-28 VITALS — BP 138/79 | HR 57 | Ht 65.0 in | Wt 126.4 lb

## 2014-08-28 DIAGNOSIS — F329 Major depressive disorder, single episode, unspecified: Secondary | ICD-10-CM

## 2014-08-28 DIAGNOSIS — F331 Major depressive disorder, recurrent, moderate: Secondary | ICD-10-CM | POA: Diagnosis not present

## 2014-08-28 DIAGNOSIS — F332 Major depressive disorder, recurrent severe without psychotic features: Secondary | ICD-10-CM

## 2014-08-28 MED ORDER — MIRTAZAPINE 30 MG PO TABS: 30.0000 mg | ORAL_TABLET | Freq: Every day | ORAL | Status: DC

## 2014-08-28 NOTE — Progress Notes (Signed)
[facility] MD Progress Note  08/28/2014 2:03 PM Derriona Branscom  MRN:  [id number] Subjective:  I feel badly This patient was seen approximately a month ago and was doing so well she was discharged to her primary care doctor. A week or 2 later she began having upper respiratory symptoms and begin on treatment for bronchitis with an antibiotic. It was about that time 2 weeks ago where she started feeling persistent deep daily depression. She describes herself as not sleeping at all and not eating. Her energy as a result is much lower than it should be she's having problems thinking and concentrating. She is contemplated the idea of suicide but it is not any true intent. It is noted that she has a past history of suicide but at this time says she has to much to live for and has no plans to act. Her thoughts are clearly only fleeting. She has no true intent. The patient says that she stopped eating and drinking less than she should. She is to exercise and cooking she's doing neither. Patient feels isolated and withdrawn and she says she's having a little bit or problems take care of herself. The patient is very resistant to coming into the hospital. Today we talked about the IOP program and she agreed to calm. She also agreed dad Remeron to her Lexapro. It is noted that many years ago she was psychiatrically hospitalized. She still driving still does all her basic ADLs. Clearly the patient has declined but I think she is safe to be independent. Nonetheless I will watch her closely and see her back here in 1-2 weeks. We will make plans for her to be assessed in the IOP program. Principal Problem: @PPROB @ Diagnosis:   Patient Active Problem List   Diagnosis Date Noted  . Diarrhea [R19.7] 06/18/2014  . Leukopenia [D72.819] 02/03/2013  . History of depression [Z86.59] 02/03/2013  . Chest pain [R07.9] 02/02/2013   Total Time spent with patient: 30 minutes   Past Medical History:  Past Medical History   Diagnosis Date  . Depression   . Dry eyes   . Cataract   . Diverticulosis   . Hx of adenomatous colonic polyps   . Internal hemorrhoids     Past Surgical History  Procedure Laterality Date  . Vaginal hysterectomy    . Cataracts surg    . Pelvic floor repair    . Colonoscopy    . Tonsillectomy    . Eye surgery     Family History:  Family History  Problem Relation Age of Onset  . CAD Neg Hx    Social History:  History  Alcohol Use No     History  Drug Use No    History   Social History  . Marital Status: Single    Spouse Name: N/A  . Number of Children: N/A  . Years of Education: N/A   Social History Main Topics  . Smoking status: Never Smoker   . Smokeless tobacco: Never Used  . Alcohol Use: No  . Drug Use: No  . Sexual Activity: Not on file   Other Topics Concern  . None   Social History Narrative   Additional History:    Sleep: Poor  Appetite:  Poor   Assessment:   Musculoskeletal: Strength & Muscle Tone:  Gait & Station:  Patient leans:    Psychiatric Specialty Exam: Physical Exam  ROS  Blood pressure 138/79, pulse 57, height 5\' 5"  (1.651 m), weight 126 lb 6.4  oz (57.335 kg).Body mass index is 21.03 kg/(m^2).  General Appearance: Disheveled  Eye Sport and exercise psychologist::  Fair  Speech:  Normal Rate  Volume:  Normal  Mood:  Depressed  Affect:  Blunt  Thought Process:  Coherent  Orientation:  Full (Time, Place, and Person)  Thought Content:  WDL  Suicidal Thoughts:  No  Homicidal Thoughts:  No  Memory:  NA  Judgement:  Fair  Insight:  Fair  Psychomotor Activity:  Decreased  Concentration:  Fair  Recall:  [place] of Knowledge:Good  Language: Good  Akathisia:  No  Handed:  Right  AIMS (if indicated):     Assets:  Desire for Improvement  ADL's:  Intact  Cognition: WNL  Sleep:        Current Medications: Current Outpatient Prescriptions  Medication Sig Dispense Refill  . aspirin 81 MG tablet Take 81 mg by mouth every other day.      . Cholecalciferol (VITAMIN D-3) 1000 UNITS CAPS Take 2,000 Units by mouth daily.    . cycloSPORINE (RESTASIS) 0.05 % ophthalmic emulsion Place 1 drop into both eyes daily.     Marland Kitchen doxycycline (VIBRAMYCIN) 100 MG capsule Take 100 mg by mouth every 12 (twelve) hours.    Marland Kitchen escitalopram (LEXAPRO) 20 MG tablet Take 1 tablet (20 mg total) by mouth daily. 30 tablet 6  . mirtazapine (REMERON) 30 MG tablet Take 1 tablet (30 mg total) by mouth at bedtime. 30 tablet 2  . Omega-3 Fatty Acids (OMEGA 3 PO) Take 2 capsules by mouth 2 (two) times daily.      No current facility-administered medications for this visit.    Lab Results: No results found for this or any previous visit (from the past 48 hour(s)).  Physical Findings: AIMS:  , ,  ,  ,    CIWA:    COWS:     Treatment Plan Summary: At this time the patient will continue taking Lexapro 20 mg in the morning. Starting tonight however show out of Remeron 30 mg. The patient return to see me in the next 1-2 weeks for reassessment. I do not believe this patient is acutely suicidal. I think she has reasonably good insight and understanding of her situation and wish and a desire to get well. She says she has to many things to live for and she's been depressed before. The patient has made no suicide attempts in her suicidal thinking is only abbreviated and transient. She is no true intense to act. She is no plans to act. At this time we will attempt to get her into the IOP program in the setting. She'll be contacted in the next day.   Medical Decision Making:  Review of New Medication or Change in Dosage (2)     Golda Zavalza IRVING 08/28/2014, 2:03 PM

## 2014-08-30 DIAGNOSIS — F331 Major depressive disorder, recurrent, moderate: Secondary | ICD-10-CM | POA: Diagnosis not present

## 2014-09-02 ENCOUNTER — Encounter (HOSPITAL_COMMUNITY): Payer: Self-pay

## 2014-09-02 ENCOUNTER — Other Ambulatory Visit (HOSPITAL_COMMUNITY): Payer: Medicare Other | Attending: Psychiatry | Admitting: Psychiatry

## 2014-09-02 DIAGNOSIS — F332 Major depressive disorder, recurrent severe without psychotic features: Secondary | ICD-10-CM

## 2014-09-02 DIAGNOSIS — Z62811 Personal history of psychological abuse in childhood: Secondary | ICD-10-CM | POA: Insufficient documentation
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
## 2014-09-03 ENCOUNTER — Encounter (HOSPITAL_COMMUNITY): Payer: Self-pay | Admitting: Psychiatry

## 2014-09-03 ENCOUNTER — Other Ambulatory Visit (HOSPITAL_COMMUNITY): Payer: Medicare Other | Admitting: Psychiatry

## 2014-09-03 DIAGNOSIS — F331 Major depressive disorder, recurrent, moderate: Secondary | ICD-10-CM
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
## 2014-09-04 ENCOUNTER — Ambulatory Visit (INDEPENDENT_AMBULATORY_CARE_PROVIDER_SITE_OTHER): Payer: Medicare Other | Admitting: Psychiatry

## 2014-09-04 ENCOUNTER — Other Ambulatory Visit (HOSPITAL_COMMUNITY): Payer: Medicare Other

## 2014-09-04 VITALS — BP 128/71 | HR 85 | Ht 65.0 in | Wt 133.8 lb

## 2014-09-04 DIAGNOSIS — F331 Major depressive disorder, recurrent, moderate: Secondary | ICD-10-CM

## 2014-09-04 DIAGNOSIS — F330 Major depressive disorder, recurrent, mild: Secondary | ICD-10-CM

## 2014-09-04 NOTE — Progress Notes (Signed)
[facility] MD Progress Note  09/04/2014 3:06 PM Sylvia Maldonado  MRN:  [id number] Subjective:  Today the patient feels bad  The patient is just started in the IOP program. She's been there for about 3 days. For the most part she feels is been productive in helpful. She's had a number of good interactions both with the providers here as well as with somebody patients. Overall she is sleeping well she is now back to eating better. She is somewhat more energy. Today the patient was seen with her daughter [name]. Her daughter claims the patient has been like this in the past years ago. The patient is not suicidal. The patient is engaging and was educated that he'll take weeks for her antidepressant to have an effect. She's taking Lexapro 20 g and just recently had a Remeron  Added. The patient is getting continue in the IOP program and return to see me in a month. At this time the patient is not suicidal. Principal Problem: @PPROB @ Diagnosis:   Patient Active Problem List   Diagnosis Date Noted  . Depression, major, recurrent, moderate [F33.1] 09/03/2014    Class: Chronic  . Diarrhea [R19.7] 06/18/2014  . Leukopenia [D72.819] 02/03/2013  . History of depression [Z86.59] 02/03/2013  . Chest pain [R07.9] 02/02/2013   Total Time spent with patient: 15 minutes   Past Medical History:  Past Medical History  Diagnosis Date  . Depression   . Dry eyes   . Cataract   . Diverticulosis   . Hx of adenomatous colonic polyps   . Internal hemorrhoids     Past Surgical History  Procedure Laterality Date  . Vaginal hysterectomy    . Cataracts surg    . Pelvic floor repair    . Colonoscopy    . Tonsillectomy    . Eye surgery     Family History:  Family History  Problem Relation Age of Onset  . CAD Neg Hx    Social History:  History  Alcohol Use No     History  Drug Use No    History   Social History  . Marital Status: Single    Spouse Name: N/A  . Number of Children: N/A  . Years of  Education: N/A   Social History Main Topics  . Smoking status: Never Smoker   . Smokeless tobacco: Never Used  . Alcohol Use: No  . Drug Use: No  . Sexual Activity: Not on file   Other Topics Concern  . Not on file   Social History Narrative   Additional History:    Sleep: Good  Appetite:  Good   Assessment:   Musculoskeletal: Strength & Muscle Tone:  Gait & Station:  Patient leans:    Psychiatric Specialty Exam: Physical Exam  ROS  Blood pressure 128/71, pulse 85, height 5\' 5"  (1.651 m), weight 133 lb 12.8 oz (60.691 kg).Body mass index is 22.27 kg/(m^2).  General Appearance:   Eye Contact::  Good  Speech:  NA and Blocked  Volume:  Normal  Mood:  Depressed  Affect:  Appropriate  Thought Process:  Goal Directed  Orientation  Thought Content:  WDL  Suicidal Thoughts:  No  Homicidal Thoughts:  No  Memory:  NA  Judgement:  NA  Insight:  Good  Psychomotor Activity:  Normal  Concentration:  Good  Recall:  Good  Fund of Knowledge:Good  Language: Good  Akathisia:  No  Handed:  Right  AIMS (if indicated):     Assets:  Desire for Improvement  ADL's:  Intact  Cognition: WNL  Sleep:        Current Medications: Current Outpatient Prescriptions  Medication Sig Dispense Refill  . aspirin 81 MG tablet Take 81 mg by mouth every other day.     . Cholecalciferol (VITAMIN D-3) 1000 UNITS CAPS Take 2,000 Units by mouth daily.    . cycloSPORINE (RESTASIS) 0.05 % ophthalmic emulsion Place 1 drop into both eyes daily.     Marland Kitchen doxycycline (VIBRAMYCIN) 100 MG capsule Take 100 mg by mouth every 12 (twelve) hours.    Marland Kitchen escitalopram (LEXAPRO) 20 MG tablet Take 1 tablet (20 mg total) by mouth daily. 30 tablet 6  . mirtazapine (REMERON) 30 MG tablet Take 1 tablet (30 mg total) by mouth at bedtime. 30 tablet 2  . Omega-3 Fatty Acids (OMEGA 3 PO) Take 2 capsules by mouth 2 (two) times daily.      No current facility-administered medications for this visit.    Lab Results: No  results found for this or any previous visit (from the past 48 hour(s)).  Physical Findings: AIMS:  , ,  ,  ,    CIWA:    COWS:     Treatment Plan Summary:   at this time the patient will continue in the IOP program. She'll continue taking Lexapro 20 mg a Remeron 30 mg. This patient to return to see me in approximate 4 weeks. Today her daughter was helpful.   Medical Decision Making:  Established Problem, Stable/Improving (1)     [name] 09/04/2014, 3:06 PM

## 2014-09-05 ENCOUNTER — Other Ambulatory Visit (HOSPITAL_COMMUNITY): Payer: Medicare Other | Admitting: Psychiatry

## 2014-09-05 DIAGNOSIS — F330 Major depressive disorder, recurrent, mild: Secondary | ICD-10-CM

## 2014-09-05 DIAGNOSIS — Z62811 Personal history of psychological abuse in childhood: Secondary | ICD-10-CM | POA: Diagnosis not present

## 2014-09-05 DIAGNOSIS — F332 Major depressive disorder, recurrent severe without psychotic features: Secondary | ICD-10-CM | POA: Diagnosis not present

## 2014-09-05 NOTE — Progress Notes (Signed)
    Daily Group Progress Note  Program: IOP  Group Time: 9:00-10:30  Participation Level: Active  Behavioral Response: Appropriate  Type of Therapy:  Psycho-education Group  Summary of Progress: Pt. Participated in medication management group with Jiles Garter.     Group Time: 10:30-12:00  Participation Level:  Active  Behavioral Response: Appropriate  Type of Therapy: Group Therapy  Summary of Progress: Pt. Participated in discussion about developing motivation. Pt. Watched the [employer] video. Pt. Discussed pain of moving from [place] and not being able to adjust to climate and social environment in [place].   Nancie Neas, LPC

## 2014-09-05 NOTE — Progress Notes (Signed)
    Daily Group Progress Note  Program: IOP  Group Time: 9:00-10:30  Participation Level: Active  Behavioral Response: Appropriate  Type of Therapy:  Group Therapy  Summary of Progress: Pt. Was with case manager and Dr. Lovena Le.     Group Time: 10:30-12:00  Participation Level:  Active  Behavioral Response: Appropriate  Type of Therapy: Psycho-education Group  Summary of Progress: Pt was active in group. Pt. watched Eilleen Kempf TED Talk and discussed the stigma around depression and anxiety. She discusses how none of her friends are aware of her having depression. Pt. covers up her illness and puts on a smile.   Nancie Neas, LPC

## 2014-09-06 ENCOUNTER — Other Ambulatory Visit (HOSPITAL_COMMUNITY): Payer: Medicare Other | Admitting: Psychiatry

## 2014-09-06 DIAGNOSIS — Z62811 Personal history of psychological abuse in childhood: Secondary | ICD-10-CM | POA: Diagnosis not present

## 2014-09-06 DIAGNOSIS — F332 Major depressive disorder, recurrent severe without psychotic features: Secondary | ICD-10-CM | POA: Diagnosis not present

## 2014-09-06 DIAGNOSIS — F330 Major depressive disorder, recurrent, mild: Secondary | ICD-10-CM

## 2014-09-06 NOTE — Progress Notes (Signed)
    Daily Group Progress Note  Program: IOP  Group Time: 9:00-10:30  Participation Level: Active  Behavioral Response: Appropriate  Type of Therapy:  Group Therapy  Summary of Progress: Pt. Reported being challenged by seasonal affective disorder. Pt. Continues to grieve loss of home and environment in [place], desire to return but feels stuck because of having purchased her home.      Group Time: 10:30-12:00  Participation Level:  Active  Behavioral Response: Appropriate  Type of Therapy: Psycho-education Group  Summary of Progress: Pt. Participated in breathing exercise and in reflection with song related to acceptance of self-image.   Nancie Neas, LPC

## 2014-09-09 ENCOUNTER — Other Ambulatory Visit (HOSPITAL_COMMUNITY): Payer: Medicare Other | Admitting: Psychiatry

## 2014-09-09 DIAGNOSIS — F332 Major depressive disorder, recurrent severe without psychotic features: Secondary | ICD-10-CM | POA: Diagnosis not present

## 2014-09-09 DIAGNOSIS — F330 Major depressive disorder, recurrent, mild: Secondary | ICD-10-CM

## 2014-09-09 DIAGNOSIS — Z62811 Personal history of psychological abuse in childhood: Secondary | ICD-10-CM | POA: Diagnosis not present

## 2014-09-09 NOTE — Progress Notes (Signed)
    Daily Group Progress Note  Program: IOP  Group Time: 9:00-10:30  Participation Level: Active  Behavioral Response: Appropriate  Type of Therapy:  Group Therapy  Summary of Progress: Pt. Reported that she was having daily headaches for the last two weeks since beginning medication. Pt. Reported that she continues to exercise daily and is looking forward to sunshine this weekend.      Group Time: 10:30-12:00  Participation Level:  Active  Behavioral Response: Appropriate  Type of Therapy: Psycho-education Group  Summary of Progress: Pt. Participated in grief and loss facilitated by Jeanella Craze.  Nancie Neas, LPC

## 2014-09-10 ENCOUNTER — Telehealth (HOSPITAL_COMMUNITY): Payer: Self-pay | Admitting: Psychiatry

## 2014-09-10 ENCOUNTER — Other Ambulatory Visit (HOSPITAL_COMMUNITY): Payer: Medicare Other

## 2014-09-10 DIAGNOSIS — F331 Major depressive disorder, recurrent, moderate: Secondary | ICD-10-CM | POA: Diagnosis not present

## 2014-09-11 ENCOUNTER — Other Ambulatory Visit (HOSPITAL_COMMUNITY): Payer: Medicare Other | Attending: Psychiatry | Admitting: Psychiatry

## 2014-09-11 ENCOUNTER — Ambulatory Visit (HOSPITAL_COMMUNITY): Payer: Self-pay | Admitting: Psychiatry

## 2014-09-11 DIAGNOSIS — F331 Major depressive disorder, recurrent, moderate: Secondary | ICD-10-CM | POA: Diagnosis not present

## 2014-09-11 DIAGNOSIS — F330 Major depressive disorder, recurrent, mild: Secondary | ICD-10-CM

## 2014-09-11 NOTE — Progress Notes (Signed)
    Daily Group Progress Note  Program: IOP  Group Time: 9:00-10:15  Participation Level: Active  Behavioral Response: Appropriate  Type of Therapy:  Psycho-education Group  Summary of Progress: Pt. Participated in medication group with Jiles Garter.     Group Time: 10:15-12:00  Participation Level:  Active  Behavioral Response: Appropriate  Type of Therapy: Group Therapy  Summary of Progress: Pt. Shared her readiness to discharge from group. Pt. Reported decision to sell her condo and feeling encouraged by the decision. Pt. Complained of headache which she attributes to medication.   Nancie Neas, LPC

## 2014-09-11 NOTE — Progress Notes (Signed)
Sylvia Maldonado is a 74 y.o. , divorced, Caucasian female, who was referred per Dr. Casimiro Needle; treatment for worsening depressive symptoms. Discussed safety options with pt. Pt. denied a plan or intent and is able to contract to safety. Admitted to prior suicide attempts but denied any gestures. Pt. has had 3 psychiatric hospitalization visits years ago for her depression, and was in inpatient for 3 weeks. Symptoms included: low motivation, decreased appetite, low energy, poor concentration, helplessness, and hopelessness. Pt denied HI or A/V hallucinations.  Triggers/Stressors: 1) Pt. moved from [place] to [place] about 4 years ago and dislikes the place she lives currently. She remembers her nice and sunny condo in [place] and she stated that she is finding a difficult time adjusting to this new area. Pt said she is just not able to fit in and this has caused her to have depressive symptoms. Pt. tries to eat a well-balanced diet and exercises every day. Pt. does go to church often. Lately pt. admits to not having much energy to do anything and says the weather affects her mood. 2) Unresolved grief/loss issues: Pt moved to [place] to be with her daughters and once she moved, one of her daughters moved to [place]. Pt. stated that her daughter moving, caused her depression to increase and has not developed a good relationship with either of them. Pt. has a total of 3 daughters, one lives here in [place], the other [place], and [place]. One of her daughters recently got diagnosed with breast cancer November 2015. Pt says that she worries about her.  Presently the pt. is in no relationships and states that she has been divorced for the last 35 years. The marriage started off great, and then it went downhill. Pt. described it as being a "bad marriage". Pt. ex-husband took her home away and walked out and left her with nothing. She denied any psychotic symptoms. Pt. is currently suffering from bronchitis, but  no chronic pain.  Family Hx: No family history of psychiatric illness.  Pt completed MH-IOP today.  C/O of headaches, grogginess, with an increase in appetite.  Pt met with Dr. Lovena Le and he instructed pt to stop taking the Remeron.  According to pt, group was helpful.  "Although at times I couldn't relate to them, it helped to listen to them."  Pt states she is feeling much better and positive.  Denies SI/HI or A/V hallucinations.  Pt is planning to put her condo up for sale tomorrow.  She is planning to look for another home her in [place], [place].  A:  D/C today.  F/U with Dr. Casimiro Needle on 10-03-14 @ 2:30 pm and Arvil Chaco, LCSW (pt to schedule).  Encouraged support groups.  R:  Pt receptive.

## 2014-09-11 NOTE — Progress Notes (Signed)
  [facility] Intensive Outpatient Program Discharge Summary  Areil [place]tey [facility] [id number]  Admission date: 09/02/2014 Discharge date: 09/11/2014  Reason for admission: depression  Chemical Use History: none  Family of Origin Issues: none  Progress in Program Toward Treatment Goals: Less depressed, more optimistic about her future, more accepting of her situation, making plans to deal with her condominium and find another place and exercising again  Progress (rationale): actively participated in group and put what she learned into practice  Advised to stop  Remeron because of headaches until she sees Dr Casimiro Needle again.    Clarene Reamer, MD 09/11/2014

## 2014-09-11 NOTE — Telephone Encounter (Signed)
Today I spoke with the patient is afternoon. She says for the last days she still cry D and had a headache despite the fact that yesterday she reduced her Remeron down to 7.5 mg. Today I suggested she discontinue the Remeron completely. The patient says her mood is improved and she's not suicidal. I invited the patient to call me next Wednesday to give me an update of how she's doing. It is noted the patient is intolerant of Wellbutrin and their problems I believe with Abilify. Effexor made her very sick. The possibility of using nortriptyline is not out of the questionupon her EKG. Will discuss at next Wednesday. Patient has a return appointment to see me in approximately 3 weeks

## 2014-09-11 NOTE — Patient Instructions (Addendum)
Patient completed MH-IOP today.  Will follow up with Dr. Casimiro Needle 10-03-14 @ 2:30 pm on and [facility], LCSW .  Encouraged support groups.

## 2014-09-12 ENCOUNTER — Other Ambulatory Visit (HOSPITAL_COMMUNITY): Payer: Medicare Other

## 2014-09-12 DIAGNOSIS — F331 Major depressive disorder, recurrent, moderate: Secondary | ICD-10-CM | POA: Diagnosis not present

## 2014-09-13 ENCOUNTER — Other Ambulatory Visit (HOSPITAL_COMMUNITY): Payer: Medicare Other

## 2014-09-13 NOTE — Progress Notes (Signed)
    Daily Group Progress Note  Program: IOP  Group Time: 9:00-10:30  Participation Level: Active  Behavioral Response: Appropriate  Type of Therapy:  Group Therapy  Summary of Progress: Pt. Presented with bright smile and affect. Pt. Reported plans to communicate with Dr. Casimiro Needle regarding her medication and headaches. Pt. Reported plans to meet with her realtor tomorrow regarding selling her home and is looking forward to renting, hopeful about her future.      Group Time: 10:30-12:00  Participation Level:  Active  Behavioral Response: Appropriate  Type of Therapy: Psycho-education Group  Summary of Progress: Pt. Participated in discharge planning and reflective reading about control.  Nancie Neas, LPC

## 2014-09-16 ENCOUNTER — Other Ambulatory Visit (HOSPITAL_COMMUNITY): Payer: Medicare Other

## 2014-09-16 DIAGNOSIS — F331 Major depressive disorder, recurrent, moderate: Secondary | ICD-10-CM | POA: Diagnosis not present

## 2014-09-17 ENCOUNTER — Other Ambulatory Visit (HOSPITAL_COMMUNITY): Payer: Medicare Other

## 2014-09-18 ENCOUNTER — Other Ambulatory Visit (HOSPITAL_COMMUNITY): Payer: Medicare Other

## 2014-09-19 ENCOUNTER — Other Ambulatory Visit (HOSPITAL_COMMUNITY): Payer: Medicare Other

## 2014-09-20 ENCOUNTER — Other Ambulatory Visit (HOSPITAL_COMMUNITY): Payer: Medicare Other

## 2014-09-23 ENCOUNTER — Other Ambulatory Visit (HOSPITAL_COMMUNITY): Payer: Medicare Other

## 2014-09-23 DIAGNOSIS — F331 Major depressive disorder, recurrent, moderate: Secondary | ICD-10-CM | POA: Diagnosis not present

## 2014-09-25 DIAGNOSIS — M79671 Pain in right foot: Secondary | ICD-10-CM | POA: Diagnosis not present

## 2014-09-25 DIAGNOSIS — L820 Inflamed seborrheic keratosis: Secondary | ICD-10-CM | POA: Diagnosis not present

## 2014-09-25 DIAGNOSIS — Z1231 Encounter for screening mammogram for malignant neoplasm of breast: Secondary | ICD-10-CM | POA: Diagnosis not present

## 2014-09-25 DIAGNOSIS — Z87412 Personal history of vulvar dysplasia: Secondary | ICD-10-CM | POA: Diagnosis not present

## 2014-09-26 DIAGNOSIS — Z961 Presence of intraocular lens: Secondary | ICD-10-CM | POA: Diagnosis not present

## 2014-09-30 DIAGNOSIS — F331 Major depressive disorder, recurrent, moderate: Secondary | ICD-10-CM | POA: Diagnosis not present

## 2014-10-03 ENCOUNTER — Ambulatory Visit (INDEPENDENT_AMBULATORY_CARE_PROVIDER_SITE_OTHER): Payer: Medicare Other | Admitting: Psychiatry

## 2014-10-03 VITALS — BP 134/69 | HR 57 | Ht 65.5 in | Wt 130.4 lb

## 2014-10-03 DIAGNOSIS — F339 Major depressive disorder, recurrent, unspecified: Secondary | ICD-10-CM | POA: Diagnosis not present

## 2014-10-03 DIAGNOSIS — F331 Major depressive disorder, recurrent, moderate: Secondary | ICD-10-CM

## 2014-10-03 MED ORDER — DEPLIN 15 MG PO TABS: 15.0000 mg | ORAL_TABLET | Freq: Once | ORAL | Status: DC

## 2014-10-03 MED ORDER — ESCITALOPRAM OXALATE 20 MG PO TABS: 20.0000 mg | ORAL_TABLET | Freq: Every day | ORAL | Status: DC

## 2014-10-03 NOTE — Progress Notes (Addendum)
Sylvia Maldonado Progress Note  10/03/2014 3:09 PM Sylvia Maldonado  MRN:  [id number] Subjective: farly  Good Stable  Reading againn no vegitative symptoms. Scared of dark days. Clearly depressed over her future. Not suicidal. Principal Problem:Major Depression Recurent Diagnosis:   Patient Active Problem List   Diagnosis Date Noted  . Depression, major, recurrent, moderate [F33.1] 09/03/2014    Class: Chronic  . Diarrhea [R19.7] 06/18/2014  . Leukopenia [D72.819] 02/03/2013  . History of depression [Z86.59] 02/03/2013  . Chest pain [R07.9] 02/02/2013   Total Time spent with patient: 20 minutes   Past Medical History:  Past Medical History  Diagnosis Date  . Depression   . Dry eyes   . Cataract   . Diverticulosis   . Hx of adenomatous colonic polyps   . Internal hemorrhoids     Past Surgical History  Procedure Laterality Date  . Vaginal hysterectomy    . Cataracts surg    . Pelvic floor repair    . Colonoscopy    . Tonsillectomy    . Eye surgery     Family History:  Family History  Problem Relation Age of Onset  . CAD Neg Hx    Social History:  History  Alcohol Use No     History  Drug Use No    History   Social History  . Marital Status: Single    Spouse Name: N/A  . Number of Children: N/A  . Years of Education: N/A   Social History Main Topics  . Smoking status: Never Smoker   . Smokeless tobacco: Never Used  . Alcohol Use: No  . Drug Use: No  . Sexual Activity: Not on file   Other Topics Concern  . Not on file   Social History Narrative   Additional History:    Sleep: Good  Appetite:  Good   Assessment:   Musculoskeletal: Strength & Muscle Tone:  Gait & Station:  Patient leans:    Psychiatric Specialty Exam: Physical Exam  ROS  Blood pressure 134/69, pulse 57, height 5' 5.5" (1.664 m), weight 130 lb 6.4 oz (59.149 kg).Body mass index is 21.36 kg/(m^2).  General Appearance: Casual  Eye Contact::  Good  Speech:  Clear and  Coherent  Volume:  Normal  Mood:  Euthymic  Affect:  Congruent  Thought Process:  Coherent  Orientation:  Full (Time, Place, and Person)  Thought Content:  WDL  Suicidal Thoughts:  No  Homicidal Thoughts:  No  Memory:  NA  Judgement:  Good  Insight:  Good  Psychomotor Activity:  Normal  Concentration:  Good  Recall:  Good  Fund of Knowledge:Good  Language: Good  Akathisia:  No  Handed:  Right  AIMS (if indicated):     Assets:  Communication Skills  ADL's:  Intact  Cognition: WNL  Sleep:        Current Medications: Current Outpatient Prescriptions  Medication Sig Dispense Refill  . aspirin 81 MG tablet Take 81 mg by mouth every other day.     . Cholecalciferol (VITAMIN D-3) 1000 UNITS CAPS Take 2,000 Units by mouth daily.    . cycloSPORINE (RESTASIS) 0.05 % ophthalmic emulsion Place 1 drop into both eyes daily.     Marland Kitchen doxycycline (VIBRAMYCIN) 100 MG capsule Take 100 mg by mouth every 12 (twelve) hours.    Marland Kitchen escitalopram (LEXAPRO) 20 MG tablet Take 1 tablet (20 mg total) by mouth daily. 30 tablet 6  . L-Methylfolate (DEPLIN) 15 MG TABS Take 15 mg  by mouth once. 30 tablet 5  . Omega-3 Fatty Acids (OMEGA 3 PO) Take 2 capsules by mouth 2 (two) times daily.      No current facility-administered medications for this visit.    Lab Results: No results found for this or any previous visit (from the past 48 hour(s)).  Physical Findings: AIMS:  , ,  ,  ,    CIWA:    COWS:     Treatment Plan Summary:  continue  Lexapro  No  remeron   Start  deplin   15   Return  10 wks   Medical Decision Making:  Review of Medication Regimen & Side Effects (2)     Sylvia Maldonado 10/03/2014, 3:09 PM

## 2014-10-04 DIAGNOSIS — F331 Major depressive disorder, recurrent, moderate: Secondary | ICD-10-CM | POA: Diagnosis not present

## 2014-10-07 DIAGNOSIS — F331 Major depressive disorder, recurrent, moderate: Secondary | ICD-10-CM | POA: Diagnosis not present

## 2014-10-10 DIAGNOSIS — F331 Major depressive disorder, recurrent, moderate: Secondary | ICD-10-CM | POA: Diagnosis not present

## 2014-10-15 DIAGNOSIS — M810 Age-related osteoporosis without current pathological fracture: Secondary | ICD-10-CM | POA: Diagnosis not present

## 2014-10-15 DIAGNOSIS — K219 Gastro-esophageal reflux disease without esophagitis: Secondary | ICD-10-CM | POA: Diagnosis not present

## 2014-10-15 DIAGNOSIS — C44721 Squamous cell carcinoma of skin of unspecified lower limb, including hip: Secondary | ICD-10-CM | POA: Diagnosis not present

## 2014-10-21 DIAGNOSIS — F331 Major depressive disorder, recurrent, moderate: Secondary | ICD-10-CM | POA: Diagnosis not present

## 2014-12-10 DIAGNOSIS — Z111 Encounter for screening for respiratory tuberculosis: Secondary | ICD-10-CM | POA: Diagnosis not present

## 2014-12-26 ENCOUNTER — Ambulatory Visit (HOSPITAL_COMMUNITY): Payer: Self-pay | Admitting: Psychiatry

## 2014-12-31 DIAGNOSIS — Z85820 Personal history of malignant melanoma of skin: Secondary | ICD-10-CM | POA: Diagnosis not present

## 2014-12-31 DIAGNOSIS — Z85828 Personal history of other malignant neoplasm of skin: Secondary | ICD-10-CM | POA: Diagnosis not present

## 2014-12-31 DIAGNOSIS — L814 Other melanin hyperpigmentation: Secondary | ICD-10-CM | POA: Diagnosis not present

## 2014-12-31 DIAGNOSIS — D239 Other benign neoplasm of skin, unspecified: Secondary | ICD-10-CM | POA: Diagnosis not present

## 2014-12-31 DIAGNOSIS — L570 Actinic keratosis: Secondary | ICD-10-CM | POA: Diagnosis not present

## 2014-12-31 DIAGNOSIS — X32XXXA Exposure to sunlight, initial encounter: Secondary | ICD-10-CM | POA: Diagnosis not present

## 2014-12-31 DIAGNOSIS — D1800 Hemangioma unspecified site: Secondary | ICD-10-CM | POA: Diagnosis not present

## 2014-12-31 DIAGNOSIS — L821 Other seborrheic keratosis: Secondary | ICD-10-CM | POA: Diagnosis not present

## 2015-01-09 DIAGNOSIS — T887XXA Unspecified adverse effect of drug or medicament, initial encounter: Secondary | ICD-10-CM | POA: Diagnosis not present

## 2015-01-16 DIAGNOSIS — T887XXD Unspecified adverse effect of drug or medicament, subsequent encounter: Secondary | ICD-10-CM | POA: Diagnosis not present

## 2015-01-20 DIAGNOSIS — Z1389 Encounter for screening for other disorder: Secondary | ICD-10-CM | POA: Diagnosis not present

## 2015-01-20 DIAGNOSIS — Z01419 Encounter for gynecological examination (general) (routine) without abnormal findings: Secondary | ICD-10-CM | POA: Diagnosis not present

## 2015-01-25 IMAGING — MG STANDARD SCREENING - COMBO
8 series · 8 of 24 positions shown · non-contrast
Comparison: Previous exam(s).

CLINICAL DATA: Screening.

EXAM:
DIGITAL SCREENING BILATERAL MAMMOGRAM WITH 3D TOMO WITH CAD

[R CC]
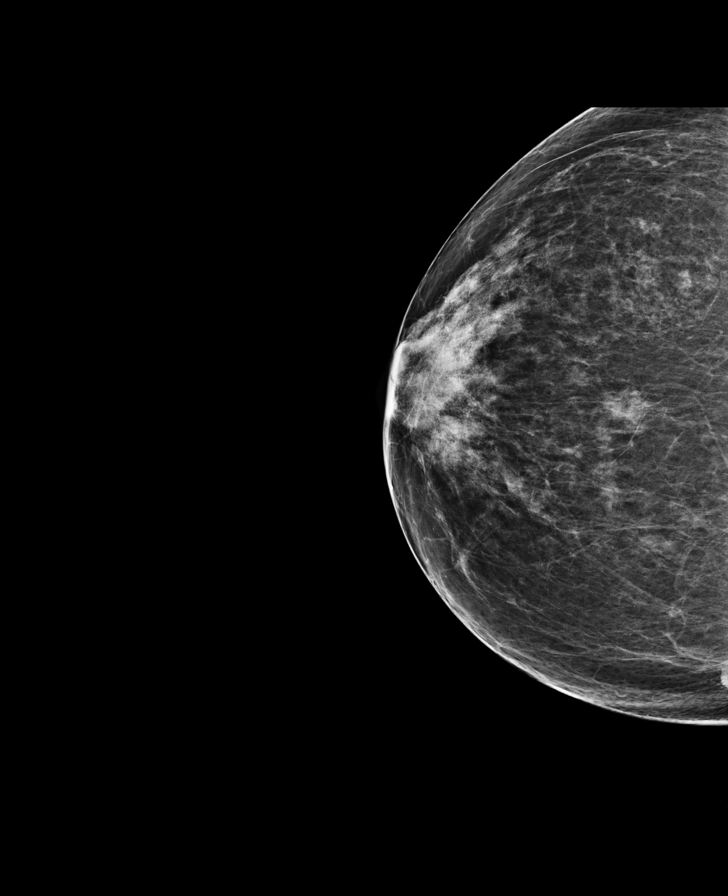

[L CC]
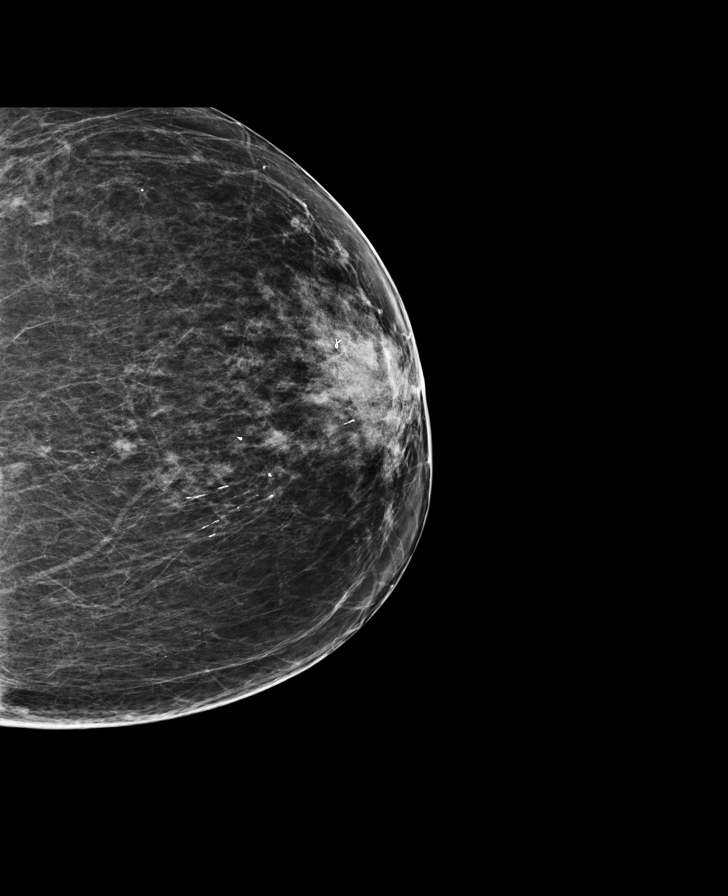

[R MLO]
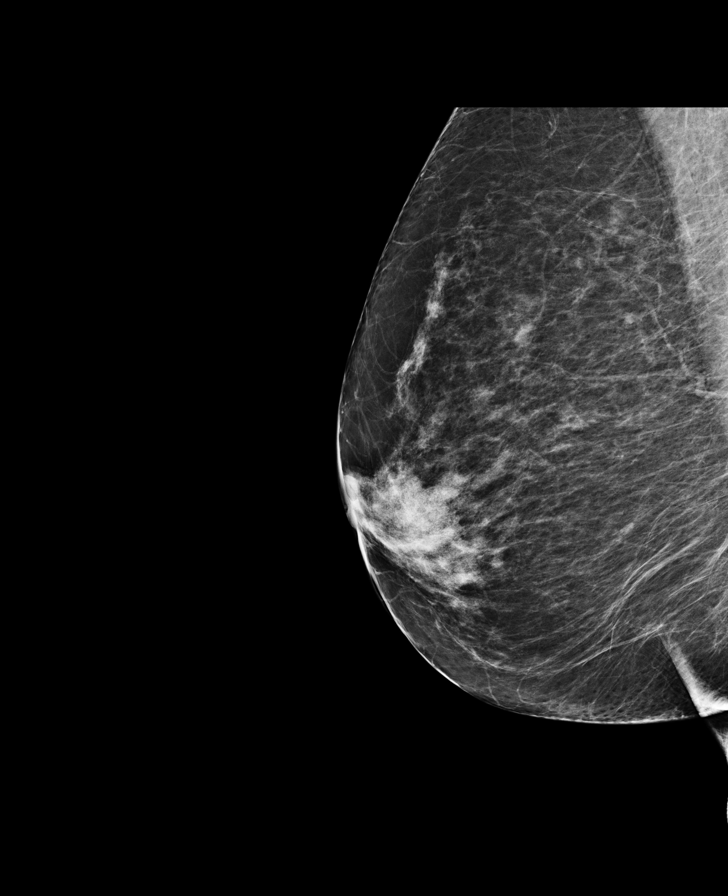

[L MLO]
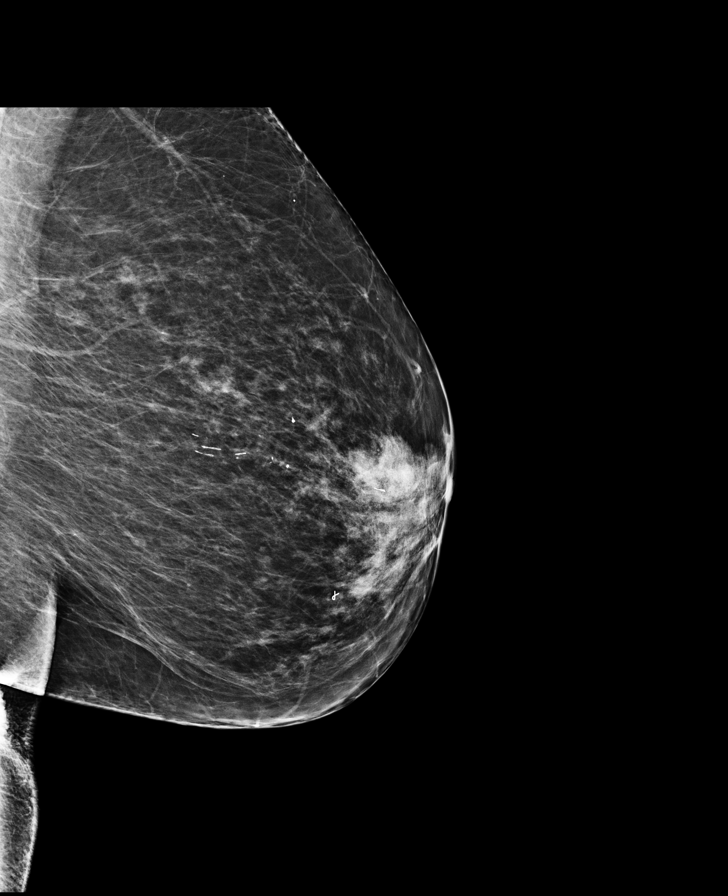

[L CC tomo · tomo slice 30/59.0]
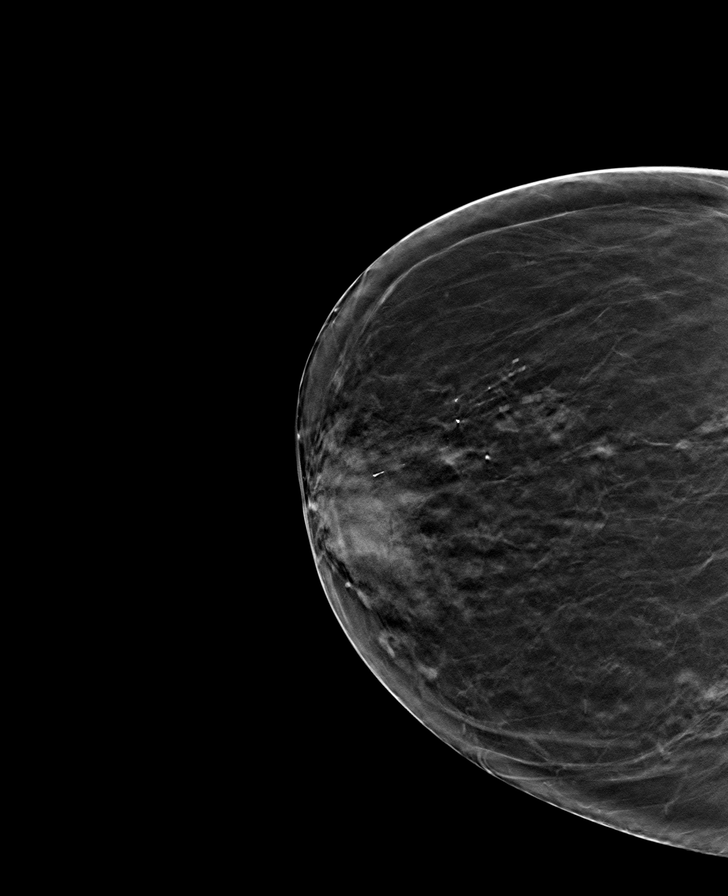

[R CC tomo · tomo slice 31/62.0]
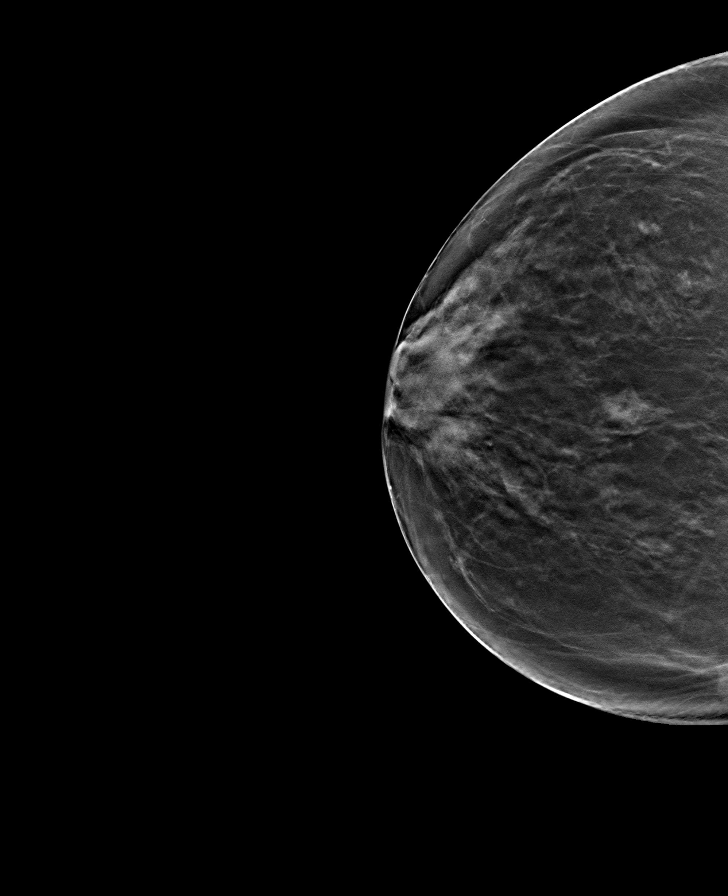

[R MLO tomo · tomo slice 33/66.0]
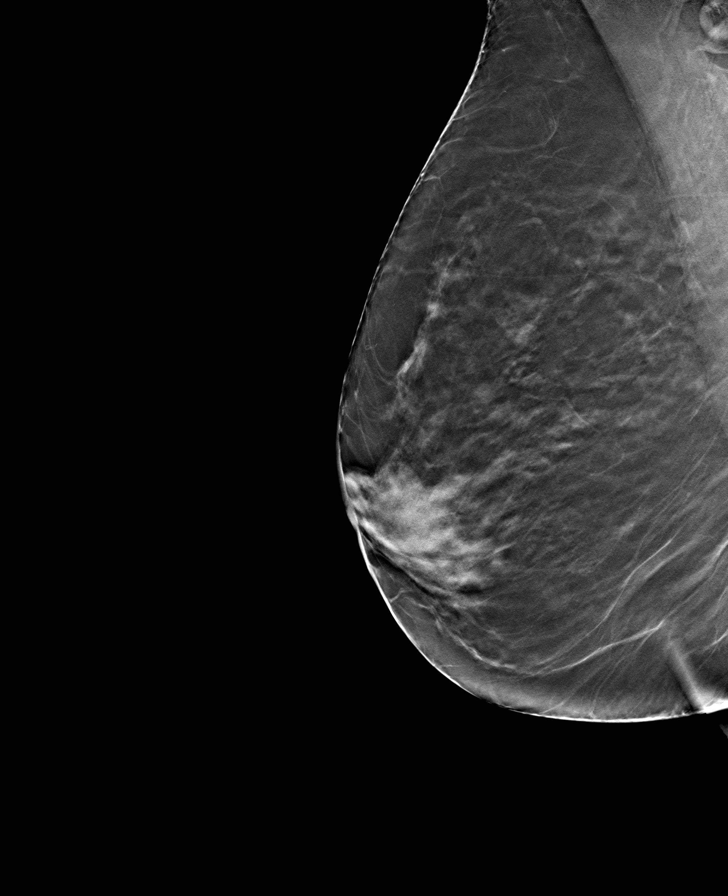

[L MLO tomo · tomo slice 33/64.0]
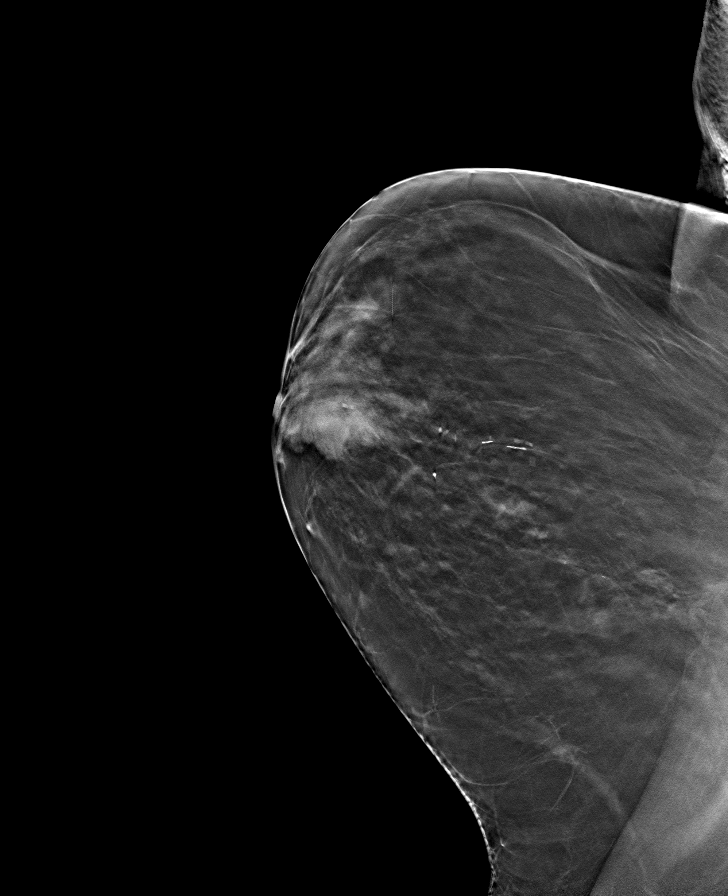

[8 of 24 positions shown; findings below may reference images not displayed]

ACR Breast Density Category c: The breast tissue is heterogeneously
dense, which may obscure small masses.
FINDINGS: There are no findings suspicious for malignancy. Images were
processed with CAD.
IMPRESSION: No mammographic evidence of malignancy. A result letter of this
screening mammogram will be mailed directly to the patient.

RECOMMENDATION:
Screening mammogram in one year. (Code:[id number])

BI-RADS CATEGORY  1: Negative

## 2015-01-29 DIAGNOSIS — E559 Vitamin D deficiency, unspecified: Secondary | ICD-10-CM | POA: Diagnosis not present

## 2015-01-29 DIAGNOSIS — R8290 Unspecified abnormal findings in urine: Secondary | ICD-10-CM | POA: Diagnosis not present

## 2015-01-29 DIAGNOSIS — E780 Pure hypercholesterolemia: Secondary | ICD-10-CM | POA: Diagnosis not present

## 2015-01-29 DIAGNOSIS — F329 Major depressive disorder, single episode, unspecified: Secondary | ICD-10-CM | POA: Diagnosis not present

## 2015-01-29 DIAGNOSIS — R350 Frequency of micturition: Secondary | ICD-10-CM | POA: Diagnosis not present

## 2015-02-05 DIAGNOSIS — L810 Postinflammatory hyperpigmentation: Secondary | ICD-10-CM | POA: Diagnosis not present

## 2015-02-05 DIAGNOSIS — L570 Actinic keratosis: Secondary | ICD-10-CM | POA: Diagnosis not present

## 2015-02-21 ENCOUNTER — Ambulatory Visit (INDEPENDENT_AMBULATORY_CARE_PROVIDER_SITE_OTHER): Payer: Medicare Other | Admitting: Internal Medicine

## 2015-02-21 ENCOUNTER — Encounter: Payer: Self-pay | Admitting: Internal Medicine

## 2015-02-21 VITALS — BP 124/70 | HR 56 | Ht 65.0 in | Wt 124.0 lb

## 2015-02-21 DIAGNOSIS — R197 Diarrhea, unspecified: Secondary | ICD-10-CM | POA: Diagnosis not present

## 2015-02-21 DIAGNOSIS — R159 Full incontinence of feces: Secondary | ICD-10-CM

## 2015-02-21 DIAGNOSIS — K589 Irritable bowel syndrome without diarrhea: Secondary | ICD-10-CM

## 2015-02-21 MED ORDER — LOPERAMIDE HCL 2 MG PO TABS: ORAL_TABLET | ORAL | Status: DC

## 2015-02-21 NOTE — Patient Instructions (Addendum)
  We have sent the following medications to your pharmacy for you to pick up at your convenience: Imodium 2mg    I appreciate the opportunity to care for you. Dr Lucia Gaskins

## 2015-02-21 NOTE — Progress Notes (Signed)
Sylvia Maldonado [facility] January 01, 1941 [id number]  Note: This dictation was prepared with Dragon digital system. Any transcriptional errors that result from this procedure are unintentional.   History of Present Illness: This is a 74 year old white female with severe irritable bowel syndrome with predominant diarrhea. Last appointment December 2015. She is now having leakage of stool and bowel movements without being aware of it.. She has lost weight from 131 pounds to  124 pounds. She was  somewhat improved on Bentyl but stopped taking it. She has  also stopped taking her antidepression medications .Her daughter has terminal breast cancer,she lives in [place]. and pt is worried about her. Last colonoscopies were in 2001, 2004, 2009 in [place] when she had an adenomatous polyp removed., Last colonoscopy in June 2014 showed mild diverticulosis. She denies rectal bleeding. Her sprue profile was negative her sedimentation rate was 12 she had somewhat low IgA level at 78    Past Medical History  Diagnosis Date  . Depression   . Dry eyes   . Cataract   . Diverticulosis   . Hx of adenomatous colonic polyps   . Internal hemorrhoids     Past Surgical History  Procedure Laterality Date  . Vaginal hysterectomy    . Cataracts surg    . Pelvic floor repair    . Colonoscopy    . Tonsillectomy    . Eye surgery      Allergies  Allergen Reactions  . Neosporin [Neomycin-Bacitracin Zn-Polymyx] Swelling and Rash    Swelling and rash  . Penicillins Swelling and Rash    swelling and rash  . Sulfa Antibiotics Swelling and Rash  . Latex Rash    rash    Family history and social history have been reviewed.  Review of Systems: Loose incontinent stools. No constipation. No abdominal pain. Positive for weight loss  The remainder of the 10 point ROS is negative except as outlined in the H&P  Physical Exam: General Appearance Well developed, in no distress Eyes  Non icteric  HEENT  Non traumatic,  normocephalic  Mouth No lesion, tongue papillated, no cheilosis Neck Supple without adenopathy, thyroid not enlarged, no carotid bruits, no JVD Lungs Clear to auscultation bilaterally COR Normal S1, normal S2, regular rhythm, no murmur, quiet precordium Abdomen soft nontender with normoactive bowel sounds. No palpable mass. Liver edge at costal margin Rectal mildly decreased rectal sphincter tone. Incontinent stool around the anal canal. Hemoccult-negative stool in the ampulla Extremities  No pedal edema Skin No lesions Neurological Alert and oriented x 3 Psychological Normal mood and affect  Assessment and Plan:   74 year old white female with depression, stress and flareup of irritable bowel syndrome .She has fecal incontinence. She stopped taking her medications, antispasmodics and antidepressants. We will  restart Imodium 2 mg at night and 2 mg in the morning. She needs to continue high-fiber diet. Consider restarting psychotropic  medications. Small possibility of lymphocytic colitis. But I think shehas a irritable bowel syndrome. She had a similar flareup of diarrhea when she was in her 41s secondary to stress and again about 2 years ago    Delfin Edis 02/21/2015

## 2015-03-06 DIAGNOSIS — L720 Epidermal cyst: Secondary | ICD-10-CM | POA: Diagnosis not present

## 2015-03-06 DIAGNOSIS — L570 Actinic keratosis: Secondary | ICD-10-CM | POA: Diagnosis not present

## 2015-03-06 DIAGNOSIS — L700 Acne vulgaris: Secondary | ICD-10-CM | POA: Diagnosis not present

## 2015-03-10 DIAGNOSIS — S93492A Sprain of other ligament of left ankle, initial encounter: Secondary | ICD-10-CM | POA: Diagnosis not present

## 2015-03-12 ENCOUNTER — Telehealth (HOSPITAL_COMMUNITY): Payer: Self-pay

## 2015-03-12 ENCOUNTER — Other Ambulatory Visit (HOSPITAL_COMMUNITY): Payer: Self-pay | Admitting: Psychiatry

## 2015-03-12 MED ORDER — QUETIAPINE FUMARATE 50 MG PO TABS: ORAL_TABLET | ORAL | Status: DC

## 2015-03-24 DIAGNOSIS — S335XXA Sprain of ligaments of lumbar spine, initial encounter: Secondary | ICD-10-CM | POA: Diagnosis not present

## 2015-03-24 DIAGNOSIS — S93492D Sprain of other ligament of left ankle, subsequent encounter: Secondary | ICD-10-CM | POA: Diagnosis not present

## 2015-03-28 ENCOUNTER — Ambulatory Visit (INDEPENDENT_AMBULATORY_CARE_PROVIDER_SITE_OTHER): Payer: Medicare Other | Admitting: Psychiatry

## 2015-03-28 VITALS — BP 120/72 | HR 60 | Ht 65.0 in | Wt 120.6 lb

## 2015-03-28 DIAGNOSIS — F331 Major depressive disorder, recurrent, moderate: Secondary | ICD-10-CM

## 2015-03-28 MED ORDER — QUETIAPINE FUMARATE 100 MG PO TABS: ORAL_TABLET | ORAL | Status: DC

## 2015-03-28 NOTE — Progress Notes (Signed)
[facility] MD Progress Note  03/28/2015 10:55 AM Sylvia Maldonado  MRN:  [id number] Subjective:  Deeply sad Principal Problem: Depression Diagnosis:  Major depression, recurrent, moderate Today this patient was essentially a walk-in. It is close evaluation this patient has intense days when she gets very very depressed. However the days or not persistent. She has more good days than bad days but today she decided to walk and toe are office and says she felt so bad she really wanted to be seen. A close valuation the patient is not suicidal. The reality is that she actually is sleeping fairly well and eating well. She has a job now working as a Quarry manager. Today reviewed the dimensions of depression and it's evident to me that her depression is not all that bad. She is not persistently depressed she is not suicidal she is not dysfunctional and she still does enjoy her work. She does not have a pessimistic attitude. She is frightened about the coming months because it'll be the winter and she does very poorly then. Last winter she was in the IOP program. The patient now is in therapy over the phone with an old therapist. The patient today is stable. She will go to work tomorrow and she is positive about that she'll feel better when she works. Patient Active Problem List   Diagnosis Date Noted  . Depression, major, recurrent, moderate [F33.1] 09/03/2014    Class: Chronic  . Diarrhea [R19.7] 06/18/2014  . Leukopenia [D72.819] 02/03/2013  . History of depression [Z86.59] 02/03/2013  . Chest pain [R07.9] 02/02/2013   Total Time spent with patient: 15 minutes   Past Medical History:  Past Medical History  Diagnosis Date  . Depression   . Dry eyes   . Cataract   . Diverticulosis   . Hx of adenomatous colonic polyps   . Internal hemorrhoids     Past Surgical History  Procedure Laterality Date  . Vaginal hysterectomy    . Cataracts surg    . Pelvic floor repair    . Colonoscopy    . Tonsillectomy    .  Eye surgery     Family History:  Family History  Problem Relation Age of Onset  . CAD Neg Hx    Social History:  History  Alcohol Use No     History  Drug Use No    Social History   Social History  . Marital Status: Single    Spouse Name: N/A  . Number of Children: N/A  . Years of Education: N/A   Social History Main Topics  . Smoking status: Never Smoker   . Smokeless tobacco: Never Used  . Alcohol Use: No  . Drug Use: No  . Sexual Activity: Not on file   Other Topics Concern  . Not on file   Social History Narrative   Additional History:    Sleep: Good  Appetite:  Good   Assessment:   Musculoskeletal: Strength & Muscle Tone: within normal limits Gait & Station: normal Patient leans: Right   Psychiatric Specialty Exam: Physical Exam  ROS  There were no vitals taken for this visit.There is no weight on file to calculate BMI.  General Appearance: NA  Eye Contact::  Good  Speech:  Clear and Coherent  Volume:  Normal  Mood:  Depressed  Affect:  Blunt  Thought Process:  Coherent  Orientation:  Full (Time, Place, and Person)  Thought Content:  WDL  Suicidal Thoughts:  No  Homicidal Thoughts:  No  Memory:  NA  Judgement:  Good  Insight:  Fair  Psychomotor Activity:  Decreased  Concentration:  Good  Recall:  Good  Fund of Knowledge:Good  Language: Fair  Akathisia:  No  Handed:  Right  AIMS (if indicated):     Assets:  Desire for Improvement  ADL's:  Intact  Cognition: WNL  Sleep:        Current Medications: Current Outpatient Prescriptions  Medication Sig Dispense Refill  . aspirin 81 MG tablet Take 81 mg by mouth every other day.     . Cholecalciferol (VITAMIN D-3) 1000 UNITS CAPS Take 2,000 Units by mouth daily.    . cycloSPORINE (RESTASIS) 0.05 % ophthalmic emulsion Place 1 drop into both eyes daily.     Marland Kitchen loperamide (IMODIUM A-D) 2 MG tablet Take one tablet at bedtime and one in the AM as needed for diarrhea. 180 tablet 3  .  Omega-3 Fatty Acids (OMEGA 3 PO) Take 2 capsules by mouth 2 (two) times daily.     . QUEtiapine (SEROQUEL) 100 MG tablet 2 qhs 60 tablet 5   No current facility-administered medications for this visit.    Lab Results: No results found for this or any previous visit (from the past 48 hour(s)).  Physical Findings: AIMS:  , ,  ,  ,    CIWA:    COWS:     Treatment Plan Summary: At this time the patient is been taking Seroquel and for the last 2 weeks she's gotten up 100 mg. She's had no bad side effects from it. When she initially started it helped her sleep better. Now her sleep is fair. The patient's mood is up and down. At this time I think it is reasonable to increase her Seroquel to a dose of 200 mg every night. She'll continue taking her Lexapro 20 mg as prescribed. The patient continue in talking therapy. Again this patient is functioning well she's not suicidal and she is not psychotic. She'll return to see me in a proximally 5 weeks.   Medical Decision Making:  New problem, with additional work up planned     [name] 03/28/2015, 10:55 AM

## 2015-04-07 ENCOUNTER — Telehealth (HOSPITAL_COMMUNITY): Payer: Self-pay

## 2015-04-07 NOTE — Telephone Encounter (Signed)
Medication management - Telephone call with patient with complaint of sleeping now up to 13 hours a day since increase in Seroquel to 200mg  from 100mg  on 03/28/15.  States has only taken 200mg  for 2 nights and would like Dr. Lenice Pressman to follow up on this concern.  Informed patient Dr. Casimiro Needle would not be back in this office until Wednesday 04/09/15 but will attempt to contact to follow up on her report of concerns for oversedation.  Patient would like call back to [phone].  Called and spoke with Dr. Casimiro Needle about the patient's complaint.  Dr. Casimiro Needle reported the medication side effect could be transient and improve after a few more days.  Agreed to instruct the patient to try to stay on the 200mg  dosage for several more days, 2-3 but if still too sedated to then cut it in 1/2 and take 150mg  total at bedtime.  Called patient back and instructed Dr. Casimiro Needle would like her to continue with 200mg  for a few more days but if did not improve with sedation to take 150mg  total at bedtime, 100mg  1 and 1/2 tablet at bedtime.  Patient stated understanding and reported "got it" and understood clearly these instructions.  Patient to call back if any further problems.

## 2015-04-11 ENCOUNTER — Ambulatory Visit (HOSPITAL_COMMUNITY): Payer: Self-pay | Admitting: Psychiatry

## 2015-05-21 ENCOUNTER — Ambulatory Visit (INDEPENDENT_AMBULATORY_CARE_PROVIDER_SITE_OTHER): Payer: Medicare Other | Admitting: Psychiatry

## 2015-05-21 VITALS — BP 121/75 | HR 71 | Ht 64.0 in | Wt 126.4 lb

## 2015-05-21 DIAGNOSIS — F331 Major depressive disorder, recurrent, moderate: Secondary | ICD-10-CM | POA: Diagnosis not present

## 2015-05-21 MED ORDER — QUETIAPINE FUMARATE 100 MG PO TABS: ORAL_TABLET | ORAL | Status: DC

## 2015-05-21 MED ORDER — CITALOPRAM HYDROBROMIDE 20 MG PO TABS: 20.0000 mg | ORAL_TABLET | Freq: Every day | ORAL | Status: DC

## 2015-05-21 NOTE — Progress Notes (Signed)
[facility] MD Progress Note  05/21/2015 2:25 PM Sylvia Maldonado  MRN:  [id number] Subjective:  Doing well Principal Problem: Major depression, recurrent, moderate Diagnosis:  Major depression, recurrent, moderate Today the patient is doing actually very well. She works almost full-time as a Quarry manager. 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 She is a number of clients and does very well. She enjoys taking care of people. The patient has 2 daughters one of which has moved to another state and has a malignancy. That daughter has cancer has had a mastectomy, chemotherapy and radiation therapy treatments. That daughter has 2 daughters were college age. The other daughter is here in [place]. She has children as well. The patient feels like she is not really being appreciated by her own children and their kids. The patient finds meaning and purpose and what she's doing. Her mood is much improved. The patient is anxious about the upcoming winter months which is usually her rough time. The patient did increase as requested her Seroquel to 200 mg and in retrospect she believes it to be very helpful. She's gained about 5 pounds and she's gotten wants that close. The patient is very thin generally. Patient is very physically active. She sleeping and eating well. She's good energy. She does yoga and other exercise programs. Patient shows no evidence of tardive dyskinesia. She is hardly oversedated. She's got good energy level. The patient is very stable. Patient Active Problem List   Diagnosis Date Noted  . Depression, major, recurrent, moderate (HCC) [F33.1] 09/03/2014    Class: Chronic  . Diarrhea [R19.7] 06/18/2014  . Leukopenia [D72.819] 02/03/2013  . History of depression [Z86.59] 02/03/2013  . Chest pain [R07.9] 02/02/2013   Total Time spent with patient: 30 minutes  Past Psychiatric History:    Past Medical History:  Past  Medical History  Diagnosis Date  . Depression   . Dry eyes   . Cataract   . Diverticulosis   . Hx of adenomatous colonic polyps   . Internal hemorrhoids     Past Surgical History  Procedure Laterality Date  . Vaginal hysterectomy    . Cataracts surg    . Pelvic floor repair    . Colonoscopy    . Tonsillectomy    . Eye surgery     Family History:  Family History  Problem Relation Age of Onset  . CAD Neg Hx    Family Psychiatric  History:  Social History:  History  Alcohol Use No     History  Drug Use No    Social History   Social History  . Marital Status: Single    Spouse Name: N/A  . Number of Children: N/A  . Years of Education: N/A   Social History Main Topics  . Smoking status: Never Smoker   . Smokeless tobacco: Never Used  . Alcohol Use: No  . Drug Use: No  . Sexual Activity: Not on file   Other Topics Concern  . Not on file   Social History Narrative   Additional Social History:                         Sleep: Good  Appetite:  Good  Current Medications: Current Outpatient Prescriptions  Medication Sig Dispense Refill  . aspirin 81 MG tablet Take 81 mg by mouth every other day.     Marland Kitchen  Cholecalciferol (VITAMIN D-3) 1000 UNITS CAPS Take 2,000 Units by mouth daily.    . citalopram (CELEXA) 20 MG tablet Take 1 tablet (20 mg total) by mouth daily. 30 tablet 5  . cycloSPORINE (RESTASIS) 0.05 % ophthalmic emulsion Place 1 drop into both eyes daily.     Marland Kitchen loperamide (IMODIUM A-D) 2 MG tablet Take one tablet at bedtime and one in the AM as needed for diarrhea. 180 tablet 3  . Omega-3 Fatty Acids (OMEGA 3 PO) Take 2 capsules by mouth 2 (two) times daily.     . QUEtiapine (SEROQUEL) 100 MG tablet 2 qhs 60 tablet 5   No current facility-administered medications for this visit.    Lab Results: No results found for this or any previous visit (from the past 48 hour(s)).  Physical Findings: AIMS:  , ,  ,  ,    CIWA:    COWS:      Musculoskeletal: Strength & Muscle Tone: within normal limits Gait & Station: normal Patient leans: Right  Psychiatric Specialty Exam: ROS  Blood pressure 121/75, pulse 71, height 5\' 4"  (1.626 m), weight 126 lb 6.4 oz (57.335 kg).Body mass index is 21.69 kg/(m^2).  General Appearance: Casual  Eye Contact::  Good  Speech:  Clear and Coherent  Volume:  Normal  Mood:  NA  Affect:  Congruent  Thought Process:  Coherent  Orientation:  Full (Time, Place, and Person)  Thought Content:  WDL  Suicidal Thoughts:  No  Homicidal Thoughts:  No  Memory:  NA  Judgement:  NA  Insight:  Good  Psychomotor Activity:  Normal  Concentration:  Good  Recall:  Good  Fund of Knowledge:Good  Language: Good  Akathisia:  No  Handed:  Right  AIMS (if indicated):     Assets:  Desire for Improvement  ADL's:  Intact  Cognition: WNL  Sleep:      Treatment Plan Summary: At this time the patient is doing very well. She'll continue taking Seroquel 200 mg in the next 4 months. We'll get through the winter. After that we will go ahead and reduce it down from 200 mg down to 100 mg area due to insurance issues regarding to change her Lexapro 20 mg 2 a dose of 20 mg Celexa. The patient will just switch over. She'll do this which probably in January. The patients in good spirits. She's not suicidal. She denies chest pain or shortness of breath. She is in good health. Today we spent over 50% time educating about the medications and it's use. The patient is very compliant. She's not suicidal. Medication management  Revere Maahs IRVING 05/21/2015, 2:25 PM

## 2015-08-04 DIAGNOSIS — Z85820 Personal history of malignant melanoma of skin: Secondary | ICD-10-CM | POA: Diagnosis not present

## 2015-08-04 DIAGNOSIS — F339 Major depressive disorder, recurrent, unspecified: Secondary | ICD-10-CM | POA: Diagnosis not present

## 2015-08-04 DIAGNOSIS — E559 Vitamin D deficiency, unspecified: Secondary | ICD-10-CM | POA: Diagnosis not present

## 2015-08-04 DIAGNOSIS — M8588 Other specified disorders of bone density and structure, other site: Secondary | ICD-10-CM | POA: Diagnosis not present

## 2015-08-04 DIAGNOSIS — Z0000 Encounter for general adult medical examination without abnormal findings: Secondary | ICD-10-CM | POA: Diagnosis not present

## 2015-08-04 DIAGNOSIS — Z1389 Encounter for screening for other disorder: Secondary | ICD-10-CM | POA: Diagnosis not present

## 2015-08-04 DIAGNOSIS — E7800 Pure hypercholesterolemia, unspecified: Secondary | ICD-10-CM | POA: Diagnosis not present

## 2015-09-29 DIAGNOSIS — H00029 Hordeolum internum unspecified eye, unspecified eyelid: Secondary | ICD-10-CM | POA: Diagnosis not present

## 2015-09-29 DIAGNOSIS — Z961 Presence of intraocular lens: Secondary | ICD-10-CM | POA: Diagnosis not present

## 2015-09-29 DIAGNOSIS — H04123 Dry eye syndrome of bilateral lacrimal glands: Secondary | ICD-10-CM | POA: Diagnosis not present

## 2015-09-29 DIAGNOSIS — Z9849 Cataract extraction status, unspecified eye: Secondary | ICD-10-CM | POA: Diagnosis not present

## 2015-10-02 DIAGNOSIS — M810 Age-related osteoporosis without current pathological fracture: Secondary | ICD-10-CM | POA: Diagnosis not present

## 2015-10-02 DIAGNOSIS — Z79899 Other long term (current) drug therapy: Secondary | ICD-10-CM | POA: Diagnosis not present

## 2015-10-02 DIAGNOSIS — K635 Polyp of colon: Secondary | ICD-10-CM | POA: Diagnosis not present

## 2015-10-02 DIAGNOSIS — E785 Hyperlipidemia, unspecified: Secondary | ICD-10-CM | POA: Diagnosis not present

## 2015-10-22 ENCOUNTER — Encounter (HOSPITAL_COMMUNITY): Payer: Self-pay | Admitting: Psychiatry

## 2015-10-22 ENCOUNTER — Ambulatory Visit (INDEPENDENT_AMBULATORY_CARE_PROVIDER_SITE_OTHER): Payer: Medicare Other | Admitting: Psychiatry

## 2015-10-22 VITALS — BP 128/65 | HR 64 | Ht 65.0 in | Wt 126.2 lb

## 2015-10-22 DIAGNOSIS — F330 Major depressive disorder, recurrent, mild: Secondary | ICD-10-CM | POA: Diagnosis not present

## 2015-10-22 MED ORDER — QUETIAPINE FUMARATE 100 MG PO TABS: ORAL_TABLET | ORAL | Status: DC

## 2015-10-22 MED ORDER — CITALOPRAM HYDROBROMIDE 20 MG PO TABS: 20.0000 mg | ORAL_TABLET | Freq: Every day | ORAL | Status: DC

## 2015-10-22 NOTE — Progress Notes (Signed)
Patient ID: Sylvia Maldonado, female   DOB: [birth year], 75 y.o.   MRN: [id number] [facility] MD Progress Note  10/22/2015 1:50 PM Nayia Gronseth Cartelli  MRN:  [id number] Subjective:  Doing well Principal Problem: Major depression, recurrent, moderate Diagnosis:  Major depression, recurrent, moderate At this time the patient still says he is very depressed directly related to the fact that she does not like her apartment and doesn't like the weather here. She said if she be in a different environment she be just fine. The patient has no other significant stresses. She's got 3 daughters one of him does not get along well with her one that is another stage I actually of malignancy and a daughter who lives here she gets along with fairly well. The patient is relatively amazing what she does. She actually is in therapy on the phone with a therapist from [place]. She talks to this therapist every week for session. 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 The patient also does yoga on a regular basis. She also does TRX training on a weekly basis. At this time the patient says that she still enjoys things in her life. While she claims she's partly depressed she certainly is not suicidal. Her depression is not exactly persistent and that it's a good day weatherwise she feels better. The pituitary today she feels depressed. The patient is sleeping and eating well. She's got good energy. She is no problems thinking and concentrating. Sometimes she has problems selecting words but it does not stop her from communicating. She is very articulate. She is very expressive. Her psychomotor functioning is normal. She denies being worthless. She is not suicidal. She never has to get any alcohol. She shows no evidence of psychosis. She does all her institutional ADLs. She still drives without problems. She is actually functioning very  well.. She does have a few friends that she walks with. She does not go to church. Her social life is somewhat restricted. I'm not clear how much better would be she moved her which is in her plans. The patient is having a hard time selling her condominium and she said if it's old she'll be leaving within weeks. She would move to [place]. Patient Active Problem List   Diagnosis Date Noted  . Depression, major, recurrent, moderate (HCC) [F33.1] 09/03/2014    Class: Chronic  . Diarrhea [R19.7] 06/18/2014  . Leukopenia [D72.819] 02/03/2013  . History of depression [Z86.59] 02/03/2013  . Chest pain [R07.9] 02/02/2013   Total Time spent with patient: 30 minutes  Past Psychiatric History:    Past Medical History:  Past Medical History  Diagnosis Date  . Depression   . Dry eyes   . Cataract   . Diverticulosis   . Hx of adenomatous colonic polyps   . Internal hemorrhoids     Past Surgical History  Procedure Laterality Date  . Vaginal hysterectomy    . Cataracts surg    . Pelvic floor repair    . Colonoscopy    . Tonsillectomy    . Eye surgery     Family History:  Family History  Problem Relation Age of Onset  . CAD Neg Hx    Family Psychiatric  History:  Social History:  History  Alcohol Use No     History  Drug Use No    Social History   Social History  . Marital Status:  Single    Spouse Name: N/A  . Number of Children: N/A  . Years of Education: N/A   Social History Main Topics  . Smoking status: Never Smoker   . Smokeless tobacco: Never Used  . Alcohol Use: No  . Drug Use: No  . Sexual Activity: Not Asked   Other Topics Concern  . None   Social History Narrative   Additional Social History:                         Sleep: Good  Appetite:  Good  Current Medications: Current Outpatient Prescriptions  Medication Sig Dispense Refill  . aspirin 81 MG tablet Take 81 mg by mouth every other day.     . Cholecalciferol (VITAMIN D-3) 1000 UNITS  CAPS Take 2,000 Units by mouth daily.    . citalopram (CELEXA) 20 MG tablet Take 1 tablet (20 mg total) by mouth daily. 30 tablet 5  . cycloSPORINE (RESTASIS) 0.05 % ophthalmic emulsion Place 1 drop into both eyes daily.     Marland Kitchen loperamide (IMODIUM A-D) 2 MG tablet Take one tablet at bedtime and one in the AM as needed for diarrhea. 180 tablet 3  . Omega-3 Fatty Acids (OMEGA 3 PO) Take 2 capsules by mouth 2 (two) times daily.     . QUEtiapine (SEROQUEL) 100 MG tablet 2 qhs 60 tablet 5   No current facility-administered medications for this visit.    Lab Results: No results found for this or any previous visit (from the past 48 hour(s)).  Physical Findings: AIMS:  , ,  ,  ,    CIWA:    COWS:     Musculoskeletal: Strength & Muscle Tone: within normal limits Gait & Station: normal Patient leans: Right  Psychiatric Specialty Exam: ROS  Blood pressure 128/65, pulse 64, height 5\' 5"  (1.651 m), weight 126 lb 3.2 oz (57.244 kg).Body mass index is 21 kg/(m^2).  General Appearance: Casual  Eye Contact::  Good  Speech:  Clear and Coherent  Volume:  Normal  Mood:  NA  Affect:  Congruent  Thought Process:  Coherent  Orientation:  Full (Time, Place, and Person)  Thought Content:  WDL  Suicidal Thoughts:  No  Homicidal Thoughts:  No  Memory:  NA  Judgement:  NA  Insight:  Good  Psychomotor Activity:  Normal  Concentration:  Good  Recall:  Good  Fund of Knowledge:Good  Language: Good  Akathisia:  No  Handed:  Right  AIMS (if indicated):     Assets:  Desire for Improvement  ADL's:  Intact  Cognition: WNL  Sleep:      At this time the patient will continue taking Celexa 20 mg. She saw her primary care doctor [place] who suggested to her that she raise the dose to 40 mg but I shared with her that in her population the dose of 20 mg the highest dose that is safely given. The patient will continue this time taking Seroquel 200 mg. She's not interested in lowering it. The patient does not  have diabetes or high cholesterol. She has gained a little bit of weight but not more than 5 or 10 pounds. The patient appears very thin. She appears very healthy. We suspect that as the weather gets warmer she'll admit that she'll start feeling better. She has an aversion to cold weather. She went to [place] for 1 week a few months ago and really enjoyed herself and she attributes that  to the warm weather. This patient will be given a return appointment to see me in about 2-1/2 months. With me she'll continue some form of supportive psychotherapy in addition to medication management. The patient is not suicidal. She has no significant physical symptoms at all.  [name] [facility] 10/22/2015, 1:50 PM

## 2015-12-01 DIAGNOSIS — L817 Pigmented purpuric dermatosis: Secondary | ICD-10-CM | POA: Diagnosis not present

## 2015-12-31 DIAGNOSIS — J069 Acute upper respiratory infection, unspecified: Secondary | ICD-10-CM | POA: Diagnosis not present

## 2016-01-05 DIAGNOSIS — J069 Acute upper respiratory infection, unspecified: Secondary | ICD-10-CM | POA: Diagnosis not present

## 2016-01-05 DIAGNOSIS — R05 Cough: Secondary | ICD-10-CM | POA: Diagnosis not present

## 2016-01-06 DIAGNOSIS — J069 Acute upper respiratory infection, unspecified: Secondary | ICD-10-CM | POA: Diagnosis not present

## 2016-01-06 DIAGNOSIS — M8588 Other specified disorders of bone density and structure, other site: Secondary | ICD-10-CM | POA: Diagnosis not present

## 2016-01-09 ENCOUNTER — Ambulatory Visit (HOSPITAL_COMMUNITY): Payer: Self-pay | Admitting: Psychiatry

## 2016-01-20 ENCOUNTER — Telehealth (HOSPITAL_COMMUNITY): Payer: Self-pay

## 2016-01-20 DIAGNOSIS — R0789 Other chest pain: Secondary | ICD-10-CM | POA: Diagnosis not present

## 2016-01-20 DIAGNOSIS — I517 Cardiomegaly: Secondary | ICD-10-CM | POA: Diagnosis not present

## 2016-01-20 DIAGNOSIS — F339 Major depressive disorder, recurrent, unspecified: Secondary | ICD-10-CM | POA: Diagnosis not present

## 2016-01-21 DIAGNOSIS — M8580 Other specified disorders of bone density and structure, unspecified site: Secondary | ICD-10-CM | POA: Diagnosis not present

## 2016-01-21 DIAGNOSIS — D071 Carcinoma in situ of vulva: Secondary | ICD-10-CM | POA: Diagnosis not present

## 2016-01-21 DIAGNOSIS — Z01419 Encounter for gynecological examination (general) (routine) without abnormal findings: Secondary | ICD-10-CM | POA: Diagnosis not present

## 2016-01-22 DIAGNOSIS — Z1231 Encounter for screening mammogram for malignant neoplasm of breast: Secondary | ICD-10-CM | POA: Diagnosis not present

## 2016-01-27 ENCOUNTER — Other Ambulatory Visit: Payer: Self-pay | Admitting: Obstetrics and Gynecology

## 2016-01-27 DIAGNOSIS — M8580 Other specified disorders of bone density and structure, unspecified site: Secondary | ICD-10-CM

## 2016-02-03 DIAGNOSIS — I517 Cardiomegaly: Secondary | ICD-10-CM | POA: Diagnosis not present

## 2016-02-03 DIAGNOSIS — R0789 Other chest pain: Secondary | ICD-10-CM | POA: Diagnosis not present

## 2016-02-03 DIAGNOSIS — R079 Chest pain, unspecified: Secondary | ICD-10-CM | POA: Diagnosis not present

## 2016-02-05 DIAGNOSIS — F339 Major depressive disorder, recurrent, unspecified: Secondary | ICD-10-CM | POA: Diagnosis not present

## 2016-02-05 DIAGNOSIS — J302 Other seasonal allergic rhinitis: Secondary | ICD-10-CM | POA: Diagnosis not present

## 2016-02-05 DIAGNOSIS — E7800 Pure hypercholesterolemia, unspecified: Secondary | ICD-10-CM | POA: Diagnosis not present

## 2016-02-05 DIAGNOSIS — E559 Vitamin D deficiency, unspecified: Secondary | ICD-10-CM | POA: Diagnosis not present

## 2016-02-06 ENCOUNTER — Other Ambulatory Visit: Payer: Self-pay

## 2016-02-10 DIAGNOSIS — Z111 Encounter for screening for respiratory tuberculosis: Secondary | ICD-10-CM | POA: Diagnosis not present

## 2016-02-13 ENCOUNTER — Telehealth (HOSPITAL_COMMUNITY): Payer: Self-pay

## 2016-02-13 ENCOUNTER — Ambulatory Visit (INDEPENDENT_AMBULATORY_CARE_PROVIDER_SITE_OTHER): Payer: Medicare Other | Admitting: Psychiatry

## 2016-02-13 VITALS — BP 140/78 | HR 64 | Ht 65.0 in | Wt 119.0 lb

## 2016-02-13 DIAGNOSIS — F329 Major depressive disorder, single episode, unspecified: Secondary | ICD-10-CM

## 2016-02-13 MED ORDER — ARIPIPRAZOLE 5 MG PO TABS: 5.0000 mg | ORAL_TABLET | Freq: Every day | ORAL | 4 refills | Status: DC

## 2016-02-13 MED ORDER — CITALOPRAM HYDROBROMIDE 20 MG PO TABS: 20.0000 mg | ORAL_TABLET | Freq: Every day | ORAL | 5 refills | Status: DC

## 2016-02-13 NOTE — Progress Notes (Signed)
Patient ID: Sylvia Maldonado, female   DOB: 1940/09/03, 75 y.o.   MRN: [id number] [facility] MD Progress Note  02/13/2016 9:58 AM Sylvia Maldonado  MRN:  [id number] Subjective:  Doing well Principal Problem: Major depression, recurrent, moderate Diagnosis:  Major depression, recurrent, moderate Today the patient shares with me that she's much worse. The patient shows to discontinue her Seroquel because she said it made her groggy. It is very evident that the Seroquel actually was helping her antidepressant. She was taking Celexa 20 mg. It is very obvious that she specifically experiencing a worsening mood state since being off the Seroquel. She apparently does not seem to figure that she should go back on it and tolerate being groggy. Nonetheless she says she feels so bad she wishes she was dead. She is not acutely suicidal. The patient really does still sleep normally. She's eating well and has good concentration. She works 30 hours a week. A month or 2 ago she was very sick with a virus. At that time but her daughters met with her and made a decision to get rid of some belongings in her home and to start becoming more efficient. Essentially the patient's virus left her and she is much better now. Physically she feels well except for chronic headache. I believe this is a tension headache that wraps around her head that occurs every day but goes away when she goes to work. The patient has no other physical complaints. The patient does not drink any alcohol. The patient still goes to the gym and she still assistance and that clicks. She says she's talking everybody really outgoing and is different. This is even the case at this time. I do not believe this patient is acutely suicidal. She still goes to yoga. Today she shared with me that she wants to discontinue her therapy with her therapist that she speaks to over the phone every week. I think the patient started seeing somebody face-to-face. Today we gave her  the name of Dr. Glennon Maldonado. Total Time spent with patient: 30 minutes  Past Psychiatric History:    Past Medical History:  Past Medical History:  Diagnosis Date  . Cataract   . Depression   . Diverticulosis   . Dry eyes   . Hx of adenomatous colonic polyps   . Internal hemorrhoids     Past Surgical History:  Procedure Laterality Date  . cataracts surg    . COLONOSCOPY    . EYE SURGERY    . PELVIC FLOOR REPAIR    . TONSILLECTOMY    . VAGINAL HYSTERECTOMY     Family History:  Family History  Problem Relation Age of Onset  . CAD Neg Hx    Family Psychiatric  History:  Social History:  History  Alcohol Use No     History  Drug Use No    Social History   Social History  . Marital status: Single    Spouse name: N/A  . Number of children: N/A  . Years of education: N/A   Social History Main Topics  . Smoking status: Never Smoker  . Smokeless tobacco: Never Used  . Alcohol use No  . Drug use: No  . Sexual activity: Not on file   Other Topics Concern  . Not on file   Social History Narrative  . No narrative on file   Additional Social History:  Sleep: Good  Appetite:  Good  Current Medications: Current Outpatient Prescriptions  Medication Sig Dispense Refill  . ARIPiprazole (ABILIFY) 5 MG tablet Take 1 tablet (5 mg total) by mouth daily. 30 tablet 4  . aspirin 81 MG tablet Take 81 mg by mouth every other day.     . Cholecalciferol (VITAMIN D-3) 1000 UNITS CAPS Take 2,000 Units by mouth daily.    . citalopram (CELEXA) 20 MG tablet Take 1 tablet (20 mg total) by mouth daily. 30 tablet 5  . cycloSPORINE (RESTASIS) 0.05 % ophthalmic emulsion Place 1 drop into both eyes daily.     Marland Kitchen loperamide (IMODIUM A-D) 2 MG tablet Take one tablet at bedtime and one in the AM as needed for diarrhea. 180 tablet 3  . Omega-3 Fatty Acids (OMEGA 3 PO) Take 2 capsules by mouth 2 (two) times daily.     . QUEtiapine (SEROQUEL) 100 MG tablet 2 qhs  60 tablet 5   No current facility-administered medications for this visit.     Lab Results: No results found for this or any previous visit (from the past 48 hour(s)).  Physical Findings: AIMS:  , ,  ,  ,    CIWA:    COWS:     Musculoskeletal: Strength & Muscle Tone: within normal limits Gait & Station: normal Patient leans: Right  Psychiatric Specialty Exam: ROS  Blood pressure 140/78, pulse 64, height _0  (1.651 m), weight 119 lb (54 kg).Body mass index is 19.8 kg/m.  General Appearance: Casual  Eye Contact::  Good  Speech:  Clear and Coherent  Volume:  Normal  Mood:  NA  Affect:  Congruent  Thought Process:  Coherent  Orientation:  Full (Time, Place, and Person)  Thought Content:  WDL  Suicidal Thoughts:  No  Homicidal Thoughts:  No  Memory:  NA  Judgement:  NA  Insight:  Good  Psychomotor Activity:  Normal  Concentration:  Good  Recall:  Good  Fund of Knowledge:Good  Language: Good  Akathisia:  No  Handed:  Right  AIMS (if indicated):     Assets:  Desire for Improvement  ADL's:  Intact  Cognition: WNL  Sleep:      At this time the patient is agreed to again go back on Abilify. Today we noticed her that it is now generic and likely will be affordable. We'll try to get prior authorization for her. She go on 5 mg of Abilify together with her Celexa 20 mg. Again I do not believe this patient is acutely suicidal. She will be starting out with a new therapist that she's going to call. I think she is medically stable. This patient will be given a return appointment to see me in 5 weeks. She was asked to come back in 3 or 4 weeks but unfortunately she shows not to because she has to work. In essence the patient would not make a earlier appointment and not go to work. So we gave her the appointment that she could come to and that we had available for her. He will be for a 15 minute medication review.  Sylvia Maldonado [facility] 02/13/2016, 9:58 AM Patient ID: Sylvia Maldonado, female   DOB: Nov 29, 1940, 75 y.o.   MRN: [id number]

## 2016-02-16 DIAGNOSIS — R0789 Other chest pain: Secondary | ICD-10-CM | POA: Diagnosis not present

## 2016-02-16 DIAGNOSIS — R079 Chest pain, unspecified: Secondary | ICD-10-CM | POA: Diagnosis not present

## 2016-02-16 DIAGNOSIS — I517 Cardiomegaly: Secondary | ICD-10-CM | POA: Diagnosis not present

## 2016-02-17 DIAGNOSIS — E7800 Pure hypercholesterolemia, unspecified: Secondary | ICD-10-CM | POA: Diagnosis not present

## 2016-02-17 DIAGNOSIS — L02416 Cutaneous abscess of left lower limb: Secondary | ICD-10-CM | POA: Diagnosis not present

## 2016-02-17 DIAGNOSIS — F339 Major depressive disorder, recurrent, unspecified: Secondary | ICD-10-CM | POA: Diagnosis not present

## 2016-02-18 DIAGNOSIS — F331 Major depressive disorder, recurrent, moderate: Secondary | ICD-10-CM | POA: Diagnosis not present

## 2016-03-02 DIAGNOSIS — F331 Major depressive disorder, recurrent, moderate: Secondary | ICD-10-CM | POA: Diagnosis not present

## 2016-03-12 DIAGNOSIS — F331 Major depressive disorder, recurrent, moderate: Secondary | ICD-10-CM | POA: Diagnosis not present

## 2016-03-18 DIAGNOSIS — L02416 Cutaneous abscess of left lower limb: Secondary | ICD-10-CM | POA: Diagnosis not present

## 2016-03-18 DIAGNOSIS — F339 Major depressive disorder, recurrent, unspecified: Secondary | ICD-10-CM | POA: Diagnosis not present

## 2016-03-18 DIAGNOSIS — E7800 Pure hypercholesterolemia, unspecified: Secondary | ICD-10-CM | POA: Diagnosis not present

## 2016-03-22 DIAGNOSIS — F331 Major depressive disorder, recurrent, moderate: Secondary | ICD-10-CM | POA: Diagnosis not present

## 2016-03-26 ENCOUNTER — Encounter (HOSPITAL_COMMUNITY): Payer: Self-pay | Admitting: Psychiatry

## 2016-03-26 ENCOUNTER — Other Ambulatory Visit (HOSPITAL_COMMUNITY): Payer: Self-pay

## 2016-03-26 ENCOUNTER — Ambulatory Visit (INDEPENDENT_AMBULATORY_CARE_PROVIDER_SITE_OTHER): Payer: Medicare Other | Admitting: Psychiatry

## 2016-03-26 VITALS — BP 120/72 | HR 63 | Ht 65.0 in | Wt 127.4 lb

## 2016-03-26 DIAGNOSIS — F339 Major depressive disorder, recurrent, unspecified: Secondary | ICD-10-CM

## 2016-03-26 MED ORDER — ARIPIPRAZOLE 5 MG PO TABS: 5.0000 mg | ORAL_TABLET | Freq: Every day | ORAL | 8 refills | Status: DC

## 2016-03-26 MED ORDER — CITALOPRAM HYDROBROMIDE 20 MG PO TABS: 20.0000 mg | ORAL_TABLET | Freq: Every day | ORAL | 8 refills | Status: DC

## 2016-03-26 NOTE — Progress Notes (Signed)
Patient ID: Sylvia Maldonado, female   DOB: 10-03-1940, 75 y.o.   MRN: [id number] [facility] MD Progress Note  03/26/2016 10:17 AM Sylvia Maldonado  MRN:  [id number] Subjective:  Doing well Principal Problem: Major depression, recurrent, moderate Diagnosis:  Major depression, recurrent,  Today the patient is seen on time. The patient is doing very well. Her mood is much improved. The additional 5 mg of Abilify made a dramatic difference later after she started. 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 She actually recently had a conflict in the setting regarding one of her clients family concerned about the way she acted in regards to cleaning out closets. The patient was asked to clean out a closet and even the family member helped her clean it out. Then that particular family member claimed that she was not given permission to clean the closet. The patient initially was resigned but now is considering going back to get his job back. She likely will. The patient seems to be very assertive and clear. She denies daily depression. She sleeps fair. She's taking melatonin 3 mg and today will increase it. The patient does not take naps and she is not sleepy. She is eating well. She can concentrate without any problems. The patient loves to read this and Netflix allows music. Patient is very active. She walks 2 times a week with a friend that she's recently made. This patient usually is guarded and isolative but she seems to be opening up just a bit. She continues going to the gym on a regular basis. She is physically very helpful. Patient does yoga. The patient drinks no alcohol. Patient shows no evidence of psychosis. She's not suicidal. She is functioning extremely well. She had a somewhat of a conflict with her daughters would seem to be resolved at this time. Past Psychiatric History:    Past Medical History:  Past Medical History:  Diagnosis Date  . Cataract   . Depression   .  Diverticulosis   . Dry eyes   . Hx of adenomatous colonic polyps   . Internal hemorrhoids     Past Surgical History:  Procedure Laterality Date  . cataracts surg    . COLONOSCOPY    . EYE SURGERY    . PELVIC FLOOR REPAIR    . TONSILLECTOMY    . VAGINAL HYSTERECTOMY     Family History:  Family History  Problem Relation Age of Onset  . CAD Neg Hx    Family Psychiatric  History:  Social History:  History  Alcohol Use No     History  Drug Use No    Social History   Social History  . Marital status: Single    Spouse name: N/A  . Number of children: N/A  . Years of education: N/A   Social History Main Topics  . Smoking status: Never Smoker  . Smokeless tobacco: Never Used  . Alcohol use No  . Drug use: No  . Sexual activity: Not Asked   Other Topics Concern  . None   Social History Narrative  . None   Additional Social History:                         Sleep: Good  Appetite:  Good  Current Medications: Current Outpatient Prescriptions  Medication Sig Dispense Refill  . ARIPiprazole (ABILIFY) 5 MG tablet Take 1 tablet (5 mg total) by mouth daily. 30 tablet 8  . aspirin  81 MG tablet Take 81 mg by mouth every other day.     . Cholecalciferol (VITAMIN D-3) 1000 UNITS CAPS Take 2,000 Units by mouth daily.    . citalopram (CELEXA) 20 MG tablet Take 1 tablet (20 mg total) by mouth daily. 30 tablet 8  . cycloSPORINE (RESTASIS) 0.05 % ophthalmic emulsion Place 1 drop into both eyes daily.     Marland Kitchen loperamide (IMODIUM A-D) 2 MG tablet Take one tablet at bedtime and one in the AM as needed for diarrhea. 180 tablet 3  . Omega-3 Fatty Acids (OMEGA 3 PO) Take 2 capsules by mouth 2 (two) times daily.     . QUEtiapine (SEROQUEL) 100 MG tablet 2 qhs 60 tablet 5   No current facility-administered medications for this visit.     Lab Results: No results found for this or any previous visit (from the past 48 hour(s)).  Physical Findings: AIMS:  , ,  ,  ,    CIWA:     COWS:     Musculoskeletal: Strength & Muscle Tone: within normal limits Gait & Station: normal Patient leans: Right  Psychiatric Specialty Exam: ROS  Blood pressure 120/72, pulse 63, height 5\' 5"  (1.651 m), weight 127 lb 6.4 oz (57.8 kg).Body mass index is 21.2 kg/m.  General Appearance: Casual  Eye Contact::  Good  Speech:  Clear and Coherent  Volume:  Normal  Mood:  NA  Affect:  Congruent  Thought Process:  Coherent  Orientation:  Full (Time, Place, and Person)  Thought Content:  WDL  Suicidal Thoughts:  No  Homicidal Thoughts:  No  Memory:  NA  Judgement:  NA  Insight:  Good  Psychomotor Activity:  Normal  Concentration:  Good  Recall:  Good  Fund of Knowledge:Good  Language: Good  Akathisia:  No  Handed:  Right  AIMS (if indicated):     Assets:  Desire for Improvement  ADL's:  Intact  Cognition: WNL  Sleep:       Sylvia Maldonado 03/26/2016, 10:17 AM Patient ID: Sylvia Maldonado, female   DOB: [birth year], 75 y.o.   MRN: [id number] Problem #1 depression. Patient is doing very well taking 20 mg of Celexa and recently started taking Abilify 5 mg. This is clearly helped her great deal. Fortunately now the drug is generic and is affordable. Her second problem seems to be a problem sleep. This is a chronic issue. At this time she is taking 3 mg of melatonin will ask her to increase it to 6 mg. The patient has enter into psychotherapy with Sylvia Maldonado. The patient seems like the therapist a great deal. At this time the patient is doing very well. She'll return to see me in 9 weeks for a 30 minute visit.

## 2016-03-29 DIAGNOSIS — F331 Major depressive disorder, recurrent, moderate: Secondary | ICD-10-CM | POA: Diagnosis not present

## 2016-04-01 DIAGNOSIS — Z23 Encounter for immunization: Secondary | ICD-10-CM | POA: Diagnosis not present

## 2016-04-01 DIAGNOSIS — L720 Epidermal cyst: Secondary | ICD-10-CM | POA: Diagnosis not present

## 2016-04-05 DIAGNOSIS — F331 Major depressive disorder, recurrent, moderate: Secondary | ICD-10-CM | POA: Diagnosis not present

## 2016-04-07 DIAGNOSIS — L814 Other melanin hyperpigmentation: Secondary | ICD-10-CM | POA: Diagnosis not present

## 2016-04-07 DIAGNOSIS — Z85820 Personal history of malignant melanoma of skin: Secondary | ICD-10-CM | POA: Diagnosis not present

## 2016-04-07 DIAGNOSIS — Z23 Encounter for immunization: Secondary | ICD-10-CM | POA: Diagnosis not present

## 2016-04-07 DIAGNOSIS — D1800 Hemangioma unspecified site: Secondary | ICD-10-CM | POA: Diagnosis not present

## 2016-04-07 DIAGNOSIS — L720 Epidermal cyst: Secondary | ICD-10-CM | POA: Diagnosis not present

## 2016-04-07 DIAGNOSIS — L821 Other seborrheic keratosis: Secondary | ICD-10-CM | POA: Diagnosis not present

## 2016-04-07 DIAGNOSIS — Z85828 Personal history of other malignant neoplasm of skin: Secondary | ICD-10-CM | POA: Diagnosis not present

## 2016-04-12 DIAGNOSIS — F331 Major depressive disorder, recurrent, moderate: Secondary | ICD-10-CM | POA: Diagnosis not present

## 2016-05-24 DIAGNOSIS — L02416 Cutaneous abscess of left lower limb: Secondary | ICD-10-CM | POA: Diagnosis not present

## 2016-05-24 DIAGNOSIS — F339 Major depressive disorder, recurrent, unspecified: Secondary | ICD-10-CM | POA: Diagnosis not present

## 2016-05-24 DIAGNOSIS — E7800 Pure hypercholesterolemia, unspecified: Secondary | ICD-10-CM | POA: Diagnosis not present

## 2016-05-25 DIAGNOSIS — F339 Major depressive disorder, recurrent, unspecified: Secondary | ICD-10-CM | POA: Diagnosis not present

## 2016-05-25 DIAGNOSIS — R21 Rash and other nonspecific skin eruption: Secondary | ICD-10-CM | POA: Diagnosis not present

## 2016-05-25 DIAGNOSIS — E7800 Pure hypercholesterolemia, unspecified: Secondary | ICD-10-CM | POA: Diagnosis not present

## 2016-05-25 DIAGNOSIS — J069 Acute upper respiratory infection, unspecified: Secondary | ICD-10-CM | POA: Diagnosis not present

## 2016-05-25 DIAGNOSIS — R202 Paresthesia of skin: Secondary | ICD-10-CM | POA: Diagnosis not present

## 2016-06-08 DIAGNOSIS — I872 Venous insufficiency (chronic) (peripheral): Secondary | ICD-10-CM | POA: Diagnosis not present

## 2016-06-08 DIAGNOSIS — Z889 Allergy status to unspecified drugs, medicaments and biological substances status: Secondary | ICD-10-CM | POA: Diagnosis not present

## 2016-06-08 DIAGNOSIS — Z23 Encounter for immunization: Secondary | ICD-10-CM | POA: Diagnosis not present

## 2016-06-11 ENCOUNTER — Ambulatory Visit (INDEPENDENT_AMBULATORY_CARE_PROVIDER_SITE_OTHER): Payer: Medicare Other | Admitting: Psychiatry

## 2016-06-11 ENCOUNTER — Ambulatory Visit (HOSPITAL_COMMUNITY): Payer: Self-pay | Admitting: Psychiatry

## 2016-06-11 ENCOUNTER — Encounter (HOSPITAL_COMMUNITY): Payer: Self-pay | Admitting: Psychiatry

## 2016-06-11 VITALS — BP 110/70 | HR 57 | Ht 65.6 in | Wt 133.0 lb

## 2016-06-11 DIAGNOSIS — F331 Major depressive disorder, recurrent, moderate: Secondary | ICD-10-CM | POA: Diagnosis not present

## 2016-06-11 DIAGNOSIS — Z9889 Other specified postprocedural states: Secondary | ICD-10-CM

## 2016-06-11 DIAGNOSIS — F3342 Major depressive disorder, recurrent, in full remission: Secondary | ICD-10-CM

## 2016-06-11 DIAGNOSIS — Z79899 Other long term (current) drug therapy: Secondary | ICD-10-CM | POA: Diagnosis not present

## 2016-06-11 MED ORDER — ARIPIPRAZOLE 5 MG PO TABS: 5.0000 mg | ORAL_TABLET | Freq: Every day | ORAL | 8 refills | Status: DC

## 2016-06-11 MED ORDER — CITALOPRAM HYDROBROMIDE 20 MG PO TABS: 20.0000 mg | ORAL_TABLET | Freq: Every day | ORAL | 8 refills | Status: DC

## 2016-06-11 NOTE — Progress Notes (Signed)
Patient ID: Sylvia Maldonado, female   DOB: 1941-06-30, 75 y.o.   MRN: [id number] [facility] MD Progress Note  06/11/2016 9:25 AM Yuhan Morsey Menser  MRN:  [id number] Subjective:  Doing well Principal Problem: Major depression, recurrent, moderate Diagnosis:  Major depression, recurrent,  Patient is doing very well. 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 She likes a great deal. Her mood is good. She denies daily depression. She is sleeping normally. Her appetite and energy are good. She's gained a little bit of weight. She can think and concentrate well. Good sense of worth. She denies use of alcohol. She is minimal anxiety. The patient has decided to go back to her old therapist in [place] and has therapy by the family. The patient takes her medicines Abilify 5 mg and Celexa 20 mg without problems. She demonstrates no evidence of tardive dyskinesia. Today reviewed the possible side effects of these agents and she agreed to take them as prescribed. The patient has good energy and a positive self-worth. We described processes clinical depression and how he requires medications but also in therapy but also to make good life decisions. She relies she's made some mistakes in the past but now she's making the best of things. She spends 1 day a week with her grandchildren and enjoys greatly. The patient is not suicidal. She is medically very stable. She exercises regularly and goes to yoga now and then.  Past Medical History:  Past Medical History:  Diagnosis Date  . Cataract   . Depression   . Diverticulosis   . Dry eyes   . Hx of adenomatous colonic polyps   . Internal hemorrhoids     Past Surgical History:  Procedure Laterality Date  . cataracts surg    . COLONOSCOPY    . EYE SURGERY    . PELVIC FLOOR REPAIR    . TONSILLECTOMY    . VAGINAL HYSTERECTOMY     Family History:  Family History  Problem Relation Age of Onset  . CAD Neg Hx    Family Psychiatric  History:  Social History:   History  Alcohol Use No     History  Drug Use No    Social History   Social History  . Marital status: Single    Spouse name: N/A  . Number of children: N/A  . Years of education: N/A   Social History Main Topics  . Smoking status: Never Smoker  . Smokeless tobacco: Never Used  . Alcohol use No  . Drug use: No  . Sexual activity: Not Asked   Other Topics Concern  . None   Social History Narrative  . None   Additional Social History:                         Sleep: Good  Appetite:  Good  Current Medications: Current Outpatient Prescriptions  Medication Sig Dispense Refill  . ARIPiprazole (ABILIFY) 5 MG tablet Take 1 tablet (5 mg total) by mouth daily. 30 tablet 8  . aspirin 81 MG tablet Take 81 mg by mouth every other day.     . Cholecalciferol (VITAMIN D-3) 1000 UNITS CAPS Take 2,000 Units by mouth daily.    . citalopram (CELEXA) 20 MG tablet Take 1 tablet (20 mg total) by mouth daily. 30 tablet 8  . cycloSPORINE (RESTASIS) 0.05 % ophthalmic emulsion Place 1 drop into both eyes daily.     Marland Kitchen loperamide (IMODIUM A-D) 2 MG  tablet Take one tablet at bedtime and one in the AM as needed for diarrhea. 180 tablet 3  . Omega-3 Fatty Acids (OMEGA 3 PO) Take 2 capsules by mouth 2 (two) times daily.      No current facility-administered medications for this visit.     Lab Results: No results found for this or any previous visit (from the past 48 hour(s)).  Physical Findings: AIMS:  , ,  ,  ,    CIWA:    COWS:     Musculoskeletal: Strength & Muscle Tone: within normal limits Gait & Station: normal Patient leans: Right  Psychiatric Specialty Exam: ROS  Blood pressure 110/70, pulse (!) 57, height 5' 5.6" (1.666 m), weight 133 lb (60.3 kg), SpO2 96 %.Body mass index is 21.73 kg/m.  General Appearance: Casual  Eye Contact::  Good  Speech:  Clear and Coherent  Volume:  Normal  Mood:  NA  Affect:  Congruent  Thought Process:  Coherent  Orientation:  Full  (Time, Place, and Person)  Thought Content:  WDL  Suicidal Thoughts:  No  Homicidal Thoughts:  No  Memory:  NA  Judgement:  NA  Insight:  Good  Psychomotor Activity:  Normal  Concentration:  Good  Recall:  Good  Fund of Knowledge:Good  Language: Good  Akathisia:  No  Handed:  Right  AIMS (if indicated):     Assets:  Desire for Improvement  ADL's:  Intact  Cognition: WNL  Sleep:       [name] [facility] 06/11/2016, 9:25 AM Patient ID: Sylvia Maldonado, female   DOB: Nov 15, 1940, 75 y.o.   M Problem #1 clinical depression the patient is doing well taking Celexa 20 mg and Abilify 5 mg. We talked about the possibility of coming off Abilify in the next year or 2. I shared with her that we'll be watching closely for tardive dyskinesia and we cemented will remove the Abilify. Patient agreed to do just this. She also continued in her therapy and continues to work 40 hours. Patient feels very good about herself. This patient would like to be seen again in 2 months and we'll do that. She is very stable.

## 2016-07-08 ENCOUNTER — Ambulatory Visit (HOSPITAL_COMMUNITY): Payer: Self-pay | Admitting: Psychiatry

## 2016-08-03 DIAGNOSIS — H04123 Dry eye syndrome of bilateral lacrimal glands: Secondary | ICD-10-CM | POA: Diagnosis not present

## 2016-08-09 DIAGNOSIS — E7800 Pure hypercholesterolemia, unspecified: Secondary | ICD-10-CM | POA: Diagnosis not present

## 2016-08-09 DIAGNOSIS — Z0000 Encounter for general adult medical examination without abnormal findings: Secondary | ICD-10-CM | POA: Diagnosis not present

## 2016-08-09 DIAGNOSIS — Z85820 Personal history of malignant melanoma of skin: Secondary | ICD-10-CM | POA: Diagnosis not present

## 2016-08-09 DIAGNOSIS — E559 Vitamin D deficiency, unspecified: Secondary | ICD-10-CM | POA: Diagnosis not present

## 2016-08-09 DIAGNOSIS — M8588 Other specified disorders of bone density and structure, other site: Secondary | ICD-10-CM | POA: Diagnosis not present

## 2016-08-09 DIAGNOSIS — F339 Major depressive disorder, recurrent, unspecified: Secondary | ICD-10-CM | POA: Diagnosis not present

## 2016-08-09 DIAGNOSIS — Z1389 Encounter for screening for other disorder: Secondary | ICD-10-CM | POA: Diagnosis not present

## 2016-08-09 DIAGNOSIS — E538 Deficiency of other specified B group vitamins: Secondary | ICD-10-CM | POA: Diagnosis not present

## 2016-08-25 ENCOUNTER — Encounter (HOSPITAL_COMMUNITY): Payer: Self-pay | Admitting: Psychiatry

## 2016-08-25 ENCOUNTER — Ambulatory Visit (INDEPENDENT_AMBULATORY_CARE_PROVIDER_SITE_OTHER): Payer: Medicare Other | Admitting: Psychiatry

## 2016-08-25 VITALS — BP 112/66 | HR 61 | Ht 64.75 in | Wt 135.2 lb

## 2016-08-25 DIAGNOSIS — F325 Major depressive disorder, single episode, in full remission: Secondary | ICD-10-CM | POA: Diagnosis not present

## 2016-08-25 DIAGNOSIS — Z79899 Other long term (current) drug therapy: Secondary | ICD-10-CM

## 2016-08-25 DIAGNOSIS — Z9889 Other specified postprocedural states: Secondary | ICD-10-CM | POA: Diagnosis not present

## 2016-08-25 DIAGNOSIS — Z90710 Acquired absence of both cervix and uterus: Secondary | ICD-10-CM

## 2016-08-25 MED ORDER — ARIPIPRAZOLE 5 MG PO TABS: 5.0000 mg | ORAL_TABLET | Freq: Every day | ORAL | 8 refills | Status: DC

## 2016-08-25 MED ORDER — CITALOPRAM HYDROBROMIDE 20 MG PO TABS: 20.0000 mg | ORAL_TABLET | Freq: Every day | ORAL | 8 refills | Status: DC

## 2016-08-25 NOTE — Progress Notes (Signed)
Patient ID: Sylvia Maldonado, female   DOB: 05/09/1941, 76 y.o.   MRN: [id number] [employer] MD Progress Note  08/25/2016 4:51 PM Mairani Clyne Putman  MRN:  [id number] Subjective:  Doing well Principal Problem: Major depression, recurrent, moderate Diagnosis:  Major depression, recurrent,  Today the patient seems fairly well. She's gained a little weight. Unfortunately her work is cut back. 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 Generally the patient is sleeping and eating well. She's got good energy. He is no romantic involvements. The patient has 73 female grandchildren. To her grandchildren ages 70 and 37 live in [place]. Their mother is a cancer survivor. To be doing well now. She is cancer free. Unfortunately the patient has a toxic relationship with her son-in-law in [place]. On the other hand she has another daughter who lives here in [place] which is the reason she is here. The other 2 grandchildren rages 31 in 81 and don't get along very well with each other but overall are good kids. The patient loves being with him. The patient has no specific bucket list. She likes to read and she wants her home to be as good as it can be. Unfortunately she has usual is very unhappy in the winter months here. She is to [place]. She age the cold and snow and dark this. She does the best she can. She dislikes the apartment she lives in. It's too small and has no windows. The only thing the patient doesn't home as she watches Netflix. She does read. The patient really denies being depressed. She's doing the best she can. She never suicidal. She denies the use of alcohol. Her health is actually good. Past Medical History:  Diagnosis Date  . Cataract   . Depression   . Diverticulosis   . Dry eyes   . Hx of adenomatous colonic polyps   . Internal hemorrhoids     Past Surgical  History:  Procedure Laterality Date  . cataracts surg    . COLONOSCOPY    . EYE SURGERY    . PELVIC FLOOR REPAIR    . TONSILLECTOMY    . VAGINAL HYSTERECTOMY     Family History:  Family History  Problem Relation Age of Onset  . CAD Neg Hx    Family Psychiatric  History:  Social History:  History  Alcohol Use No     History  Drug Use No    Social History   Social History  . Marital status: Single    Spouse name: N/A  . Number of children: N/A  . Years of education: N/A   Social History Main Topics  . Smoking status: Never Smoker  . Smokeless tobacco: Never Used  . Alcohol use No  . Drug use: No  . Sexual activity: Not Currently   Other Topics Concern  . None   Social History Narrative  . None   Additional Social History:                         Sleep: Good  Appetite:  Good  Current Medications: Current Outpatient Prescriptions  Medication Sig Dispense Refill  . ARIPiprazole (ABILIFY) 5 MG tablet Take 1 tablet (5 mg total) by mouth daily. 30 tablet 8  . aspirin 81 MG tablet Take 81 mg by mouth every other day.     Marland Kitchen  Cholecalciferol (VITAMIN D-3) 1000 UNITS CAPS Take 2,000 Units by mouth daily.    . citalopram (CELEXA) 20 MG tablet Take 1 tablet (20 mg total) by mouth daily. 30 tablet 8  . cycloSPORINE (RESTASIS) 0.05 % ophthalmic emulsion Place 1 drop into both eyes daily.     Marland Kitchen loperamide (IMODIUM A-D) 2 MG tablet Take one tablet at bedtime and one in the AM as needed for diarrhea. 180 tablet 3  . Omega-3 Fatty Acids (OMEGA 3 PO) Take 2 capsules by mouth 2 (two) times daily.      No current facility-administered medications for this visit.     Lab Results: No results found for this or any previous visit (from the past 48 hour(s)).  Physical Findings: AIMS:  , ,  ,  ,    CIWA:    COWS:     Musculoskeletal: Strength & Muscle Tone: within normal limits Gait & Station: normal Patient leans: Right  Psychiatric Specialty Exam: ROS   Blood pressure 112/66, pulse 61, height 5' 4.75" (1.645 m), weight 135 lb 3.2 oz (61.3 kg), SpO2 96 %.Body mass index is 22.67 kg/m.  General Appearance: Casual  Eye Contact::  Good  Speech:  Clear and Coherent  Volume:  Normal  Mood:  NA  Affect:  Congruent  Thought Process:  Coherent  Orientation:  Full (Time, Place, and Person)  Thought Content:  WDL  Suicidal Thoughts:  No  Homicidal Thoughts:  No  Memory:  NA  Judgement:  NA  Insight:  Good  Psychomotor Activity:  Normal  Concentration:  Good  Recall:  Good  Fund of Knowledge:Good  Language: Good  Akathisia:  No  Handed:  Right  AIMS (if indicated):     Assets:  Desire for Improvement  ADL's:  Intact  Cognition: WNL  Sleep:       [name] [facility] 08/25/2016, 4:51 PM Patient ID:. Today the patient is actually doing well. We will continue her Celexa 20 mg and her Abilify 5 mg area as noted she knows about the side effects of these agents and is willing to continue taking. She shows no evidence of tardive dyskinesia. The patient is very healthy. She exercises regularly. She is no falls. Her mood is very stable. She is not suicidal. She denies any chest pain shortness of breath or any neurological symptoms at this time. She continues in his talking on the phone with the therapist in [place] in a 30 minute session. She'll continue to return to see me in 2 months we will have some degree of supportive psychotherapy. Patient continue taking her medicines as prescribed.

## 2016-10-20 DIAGNOSIS — M25572 Pain in left ankle and joints of left foot: Secondary | ICD-10-CM | POA: Diagnosis not present

## 2016-10-27 ENCOUNTER — Ambulatory Visit (HOSPITAL_COMMUNITY): Payer: Self-pay | Admitting: Psychiatry

## 2016-11-15 DIAGNOSIS — E7800 Pure hypercholesterolemia, unspecified: Secondary | ICD-10-CM | POA: Diagnosis not present

## 2016-11-15 DIAGNOSIS — F339 Major depressive disorder, recurrent, unspecified: Secondary | ICD-10-CM | POA: Diagnosis not present

## 2016-11-15 DIAGNOSIS — E559 Vitamin D deficiency, unspecified: Secondary | ICD-10-CM | POA: Diagnosis not present

## 2016-11-15 DIAGNOSIS — R251 Tremor, unspecified: Secondary | ICD-10-CM | POA: Diagnosis not present

## 2016-11-22 DIAGNOSIS — M25572 Pain in left ankle and joints of left foot: Secondary | ICD-10-CM | POA: Diagnosis not present

## 2016-11-25 DIAGNOSIS — L821 Other seborrheic keratosis: Secondary | ICD-10-CM | POA: Diagnosis not present

## 2016-11-25 DIAGNOSIS — L720 Epidermal cyst: Secondary | ICD-10-CM | POA: Diagnosis not present

## 2016-12-16 ENCOUNTER — Ambulatory Visit (INDEPENDENT_AMBULATORY_CARE_PROVIDER_SITE_OTHER): Payer: Medicare Other | Admitting: Psychiatry

## 2016-12-16 VITALS — BP 130/81 | HR 90 | Ht 64.5 in | Wt 128.0 lb

## 2016-12-16 DIAGNOSIS — F325 Major depressive disorder, single episode, in full remission: Secondary | ICD-10-CM

## 2016-12-16 DIAGNOSIS — F339 Major depressive disorder, recurrent, unspecified: Secondary | ICD-10-CM | POA: Diagnosis not present

## 2016-12-16 DIAGNOSIS — Z79899 Other long term (current) drug therapy: Secondary | ICD-10-CM | POA: Diagnosis not present

## 2016-12-16 DIAGNOSIS — Z7982 Long term (current) use of aspirin: Secondary | ICD-10-CM | POA: Diagnosis not present

## 2016-12-16 MED ORDER — ARIPIPRAZOLE 5 MG PO TABS: 5.0000 mg | ORAL_TABLET | Freq: Every day | ORAL | 8 refills | Status: DC

## 2016-12-16 MED ORDER — ARIPIPRAZOLE 2 MG PO TABS: 5.0000 mg | ORAL_TABLET | Freq: Every day | ORAL | 8 refills | Status: DC

## 2016-12-16 MED ORDER — CITALOPRAM HYDROBROMIDE 20 MG PO TABS: 20.0000 mg | ORAL_TABLET | Freq: Every day | ORAL | 8 refills | Status: DC

## 2016-12-16 NOTE — Progress Notes (Signed)
Patient ID: Sylvia Maldonado, female   DOB: 05/21/1941, 76 y.o.   MRN: [id number] [facility] MD Progress Note  12/16/2016 3:31 PM Sylvia Maldonado  MRN:  [id number] Subjective:  Doing well Principal Problem: Major depression, recurrent, moderate Diagnosis:  Major depression, recurrent,  Today the patient is doing well. Since I've seen her she broke her foot and walking. She's been instructed that she cannot take long walks. The patient goes to yoga all the time now and she works out in [employer]. She is very health conscious. Patient's mood is good. She actually sold condo she's living with the thoughts that she would move. After she sold that she realized she didn't want to sell it and she took it back. Instead she pain in the inside and feels better about her living space. She's made the decision not to move to [place]. She doesn't what to go there. She wants to stay here with her daughter and her 2 grandchildren. Patient says she is interested in romance but they're no opportunities at this time she is sleeping and eating well. She's got good energy. She enjoys listening to Netflix. She got rid of her television. He does read some. She's not suicidal and never has been. She drinks no alcohol. Her primary care doctor is Dr. Leighton Ruff she sees regularly. The patient has a telephone therapist. She talks or every month from [place]. Female every day. The patient shows no evidence of tardive dyskinesia. She loves the sunshine. Patient is never had any psychosis. Overall she is more focused and energized. Past Medical History:  Diagnosis Date  . Cataract   . Depression   . Diverticulosis   . Dry eyes   . Hx of adenomatous colonic polyps   . Internal hemorrhoids     Past Surgical History:  Procedure Laterality Date  . cataracts surg    . COLONOSCOPY    . EYE SURGERY    . PELVIC FLOOR REPAIR    . TONSILLECTOMY    . VAGINAL HYSTERECTOMY     Family History:  Family History  Problem Relation  Age of Onset  . CAD Neg Hx    Family Psychiatric  History:  Social History:  History  Alcohol Use No     History  Drug Use No    Social History   Social History  . Marital status: Single    Spouse name: N/A  . Number of children: N/A  . Years of education: N/A   Social History Main Topics  . Smoking status: Never Smoker  . Smokeless tobacco: Never Used  . Alcohol use No  . Drug use: No  . Sexual activity: Not Currently   Other Topics Concern  . Not on file   Social History Narrative  . No narrative on file   Additional Social History:                         Sleep: Good  Appetite:  Good  Current Medications: Current Outpatient Prescriptions  Medication Sig Dispense Refill  . ARIPiprazole (ABILIFY) 2 MG tablet Take 2.5 tablets (5 mg total) by mouth daily. 30 tablet 8  . aspirin 81 MG tablet Take 81 mg by mouth every other day.     . Cholecalciferol (VITAMIN D-3) 1000 UNITS CAPS Take 2,000 Units by mouth daily.    . citalopram (CELEXA) 20 MG tablet Take 1 tablet (20 mg total) by mouth daily. 30 tablet 8  .  cycloSPORINE (RESTASIS) 0.05 % ophthalmic emulsion Place 1 drop into both eyes daily.     Marland Kitchen loperamide (IMODIUM A-D) 2 MG tablet Take one tablet at bedtime and one in the AM as needed for diarrhea. 180 tablet 3  . Omega-3 Fatty Acids (OMEGA 3 PO) Take 2 capsules by mouth 2 (two) times daily.      No current facility-administered medications for this visit.     Lab Results: No results found for this or any previous visit (from the past 48 hour(s)).  Physical Findings: AIMS:  , ,  ,  ,    CIWA:    COWS:     Musculoskeletal: Strength & Muscle Tone: within normal limits Gait & Station: normal Patient leans: Right  Psychiatric Specialty Exam: ROS  Blood pressure 130/81, pulse 90, height 5' 4.5" (1.638 m), weight 128 lb (58.1 kg).Body mass index is 21.63 kg/m.  General Appearance: Casual  Eye Contact::  Good  Speech:  Clear and Coherent   Volume:  Normal  Mood:  NA  Affect:  Congruent  Thought Process:  Coherent  Orientation:  Full (Time, Place, and Person)  Thought Content:  WDL  Suicidal Thoughts:  No  Homicidal Thoughts:  No  Memory:  NA  Judgement:  NA  Insight:  Good  Psychomotor Activity:  Normal  Concentration:  Good  Recall:  Good  Fund of Knowledge:Good  Language: Good  Akathisia:  No  Handed:  Right  AIMS (if indicated):     Assets:  Desire for Improvement  ADL's:  Intact  Cognition: WNL  Sleep:       [name] 12/16/2016, 3:31 PM Patient ID:. At this time we'll go ahead and reduce her Abilify to dose of 2 mg. She'll continue this and return to see me in 3 months. She'll continue taking Celexa 20 mg. She'll continue in talking therapy over the phone with a therapist in [place]. The patient is very stable at this time. She is healthy.

## 2017-01-11 ENCOUNTER — Other Ambulatory Visit (HOSPITAL_COMMUNITY): Payer: Self-pay

## 2017-01-11 MED ORDER — ARIPIPRAZOLE 2 MG PO TABS: 2.0000 mg | ORAL_TABLET | Freq: Every day | ORAL | 8 refills | Status: DC

## 2017-01-21 DIAGNOSIS — F4323 Adjustment disorder with mixed anxiety and depressed mood: Secondary | ICD-10-CM | POA: Diagnosis not present

## 2017-02-08 DIAGNOSIS — F4323 Adjustment disorder with mixed anxiety and depressed mood: Secondary | ICD-10-CM | POA: Diagnosis not present

## 2017-02-16 DIAGNOSIS — Z111 Encounter for screening for respiratory tuberculosis: Secondary | ICD-10-CM | POA: Diagnosis not present

## 2017-02-18 DIAGNOSIS — F4323 Adjustment disorder with mixed anxiety and depressed mood: Secondary | ICD-10-CM | POA: Diagnosis not present

## 2017-02-25 DIAGNOSIS — F4323 Adjustment disorder with mixed anxiety and depressed mood: Secondary | ICD-10-CM | POA: Diagnosis not present

## 2017-02-28 DIAGNOSIS — E559 Vitamin D deficiency, unspecified: Secondary | ICD-10-CM | POA: Diagnosis not present

## 2017-02-28 DIAGNOSIS — F339 Major depressive disorder, recurrent, unspecified: Secondary | ICD-10-CM | POA: Diagnosis not present

## 2017-02-28 DIAGNOSIS — E7800 Pure hypercholesterolemia, unspecified: Secondary | ICD-10-CM | POA: Diagnosis not present

## 2017-03-07 DIAGNOSIS — F339 Major depressive disorder, recurrent, unspecified: Secondary | ICD-10-CM | POA: Diagnosis not present

## 2017-03-07 DIAGNOSIS — E7800 Pure hypercholesterolemia, unspecified: Secondary | ICD-10-CM | POA: Diagnosis not present

## 2017-03-07 DIAGNOSIS — E559 Vitamin D deficiency, unspecified: Secondary | ICD-10-CM | POA: Diagnosis not present

## 2017-03-15 DIAGNOSIS — F4323 Adjustment disorder with mixed anxiety and depressed mood: Secondary | ICD-10-CM | POA: Diagnosis not present

## 2017-03-16 ENCOUNTER — Ambulatory Visit (INDEPENDENT_AMBULATORY_CARE_PROVIDER_SITE_OTHER): Payer: Medicare Other | Admitting: Psychiatry

## 2017-03-16 ENCOUNTER — Encounter (HOSPITAL_COMMUNITY): Payer: Self-pay | Admitting: Psychiatry

## 2017-03-16 VITALS — BP 116/68 | HR 60 | Ht 65.75 in | Wt 132.0 lb

## 2017-03-16 DIAGNOSIS — F325 Major depressive disorder, single episode, in full remission: Secondary | ICD-10-CM

## 2017-03-16 MED ORDER — CITALOPRAM HYDROBROMIDE 20 MG PO TABS: 20.0000 mg | ORAL_TABLET | Freq: Every day | ORAL | 8 refills | Status: DC

## 2017-03-16 NOTE — Progress Notes (Signed)
Patient ID: Sylvia Maldonado, female   DOB: [birth year], 76 y.o.   MRN: [id number] [facility] MD Progress Note  03/16/2017 4:07 PM Sylvia Maldonado  MRN:  [id number] Subjective:  Doing well Principal Problem: Major depression, recurrent, moderate Diagnosis:  Major depression, recurrent,   Patient is doing fairly well. Her latest he'll but she still cannot walk long distances she's very active. Her mood is good form. She is talkative and outgoing. She's gone back to [employer] who is year-old therapist. The patient is actually sleeping fair. She doesn't have any daytime dysfunction. I think this is her normal sleep pattern shree'll find himself waking up at 3 or 4 for little bit and then goes back to sleep. the patient is eating well. She's got good energy. She enjoys life. She does not take any alcohol use any drugs she has no psychosis. Patient is active functioning on her own independently. She enjoys her 2 grandchildren reveal. She has a reasonable social life. She's having difficulty finding a man her age. She try Going on on file to find a  Man but had no success.   Past Medical History:  Diagnosis Date  . Cataract   . Depression   . Diverticulosis   . Dry eyes   . Hx of adenomatous colonic polyps   . Internal hemorrhoids     Past Surgical History:  Procedure Laterality Date  . cataracts surg    . COLONOSCOPY    . EYE SURGERY    . PELVIC FLOOR REPAIR    . TONSILLECTOMY    . VAGINAL HYSTERECTOMY     Family History:  Family History  Problem Relation Age of Onset  . CAD Neg Hx    Family Psychiatric  History:  Social History:  History  Alcohol Use No     History  Drug Use No    Social History   Social History  . Marital status: Single    Spouse name: N/A  . Number of children: N/A  . Years of education: N/A   Social History Main Topics  . Smoking status: Never Smoker  . Smokeless tobacco: Never Used  . Alcohol use No  . Drug use: No  . Sexual activity: Not  Currently   Other Topics Concern  . None   Social History Narrative  . None   Additional Social History:                         Sleep: Good  Appetite:  Good  Current Medications: Current Outpatient Prescriptions  Medication Sig Dispense Refill  . ARIPiprazole (ABILIFY) 2 MG tablet Take 1 tablet (2 mg total) by mouth daily. 30 tablet 8  . aspirin 81 MG tablet Take 81 mg by mouth every other day.     . Cholecalciferol (VITAMIN D-3) 1000 UNITS CAPS Take 2,000 Units by mouth daily.    . citalopram (CELEXA) 20 MG tablet Take 1 tablet (20 mg total) by mouth daily. 30 tablet 8  . cycloSPORINE (RESTASIS) 0.05 % ophthalmic emulsion Place 1 drop into both eyes daily.     . vitamin B-12 (CYANOCOBALAMIN) 1000 MCG tablet Take 1,000 mcg by mouth daily.    Marland Kitchen loperamide (IMODIUM A-D) 2 MG tablet Take one tablet at bedtime and one in the AM as needed for diarrhea. (Patient not taking: Reported on 03/16/2017) 180 tablet 3  . Omega-3 Fatty Acids (OMEGA 3 PO) Take 2 capsules by mouth 2 (two) times daily.  No current facility-administered medications for this visit.     Lab Results: No results found for this or any previous visit (from the past 48 hour(s)).  Physical Findings: AIMS:  , ,  ,  ,    CIWA:    COWS:     Musculoskeletal: Strength & Muscle Tone: within normal limits Gait & Station: normal Patient leans: Right  Psychiatric Specialty Exam: ROS  Blood pressure 116/68, pulse 60, height 5' 5.75" (1.67 m), weight 132 lb (59.9 kg), SpO2 96 %.Body mass index is 21.47 kg/m.  General Appearance: Casual  Eye Contact::  Good  Speech:  Clear and Coherent  Volume:  Normal  Mood:  NA  Affect:  Congruent  Thought Process:  Coherent  Orientation:  Full (Time, Place, and Person)  Thought Content:  WDL  Suicidal Thoughts:  No  Homicidal Thoughts:  No  Memory:  NA  Judgement:  NA  Insight:  Good  Psychomotor Activity:  Normal  Concentration:  Good  Recall:  Good  Fund of  Knowledge:Good  Language: Good  Akathisia:  No  Handed:  Right  AIMS (if indicated):     Assets:  Desire for Improvement  ADL's:  Intact  Cognition: WNL  Sleep:       [name] 03/16/2017, 4:07 PM Patient ID:. At this time this letter and will go ahead and discontinue her Abilify. She doesn't have any overt side effects but this time it makes sense to try to get all. I told her that she might experience some  were  movements for a month or 2 which are probably part of withdrawal. She may not experience that she may have no problems. She'll continue taking Celexa as ordered. this patient will return to see me in 2 months.

## 2017-03-25 DIAGNOSIS — F4323 Adjustment disorder with mixed anxiety and depressed mood: Secondary | ICD-10-CM | POA: Diagnosis not present

## 2017-03-30 DIAGNOSIS — F4323 Adjustment disorder with mixed anxiety and depressed mood: Secondary | ICD-10-CM | POA: Diagnosis not present

## 2017-04-08 DIAGNOSIS — M25552 Pain in left hip: Secondary | ICD-10-CM | POA: Diagnosis not present

## 2017-04-08 DIAGNOSIS — M545 Low back pain: Secondary | ICD-10-CM | POA: Diagnosis not present

## 2017-04-08 DIAGNOSIS — F4323 Adjustment disorder with mixed anxiety and depressed mood: Secondary | ICD-10-CM | POA: Diagnosis not present

## 2017-04-12 ENCOUNTER — Telehealth (HOSPITAL_COMMUNITY): Payer: Self-pay

## 2017-04-12 DIAGNOSIS — M545 Low back pain: Secondary | ICD-10-CM | POA: Diagnosis not present

## 2017-04-12 DIAGNOSIS — F4323 Adjustment disorder with mixed anxiety and depressed mood: Secondary | ICD-10-CM | POA: Diagnosis not present

## 2017-04-12 NOTE — Telephone Encounter (Signed)
Patient is calling to increase her Abilify to 5 mg. She is supposed to be tapering off per last note, but patient states she is is a bad depression and needs to go back to 5 mg. Please review and advise, thank you

## 2017-04-13 DIAGNOSIS — Z85828 Personal history of other malignant neoplasm of skin: Secondary | ICD-10-CM | POA: Diagnosis not present

## 2017-04-13 DIAGNOSIS — D1800 Hemangioma unspecified site: Secondary | ICD-10-CM | POA: Diagnosis not present

## 2017-04-13 DIAGNOSIS — L814 Other melanin hyperpigmentation: Secondary | ICD-10-CM | POA: Diagnosis not present

## 2017-04-13 DIAGNOSIS — Z23 Encounter for immunization: Secondary | ICD-10-CM | POA: Diagnosis not present

## 2017-04-13 DIAGNOSIS — L821 Other seborrheic keratosis: Secondary | ICD-10-CM | POA: Diagnosis not present

## 2017-04-13 DIAGNOSIS — D225 Melanocytic nevi of trunk: Secondary | ICD-10-CM | POA: Diagnosis not present

## 2017-04-13 DIAGNOSIS — Z85820 Personal history of malignant melanoma of skin: Secondary | ICD-10-CM | POA: Diagnosis not present

## 2017-04-13 MED ORDER — ARIPIPRAZOLE 5 MG PO TABS: 5.0000 mg | ORAL_TABLET | Freq: Every day | ORAL | 0 refills | Status: DC

## 2017-04-13 NOTE — Telephone Encounter (Signed)
Per Dr. Casimiro Needle, new prescription for Abilify was sent to the pharmacy 5 mg 1 po qd. I Called patient and let her know

## 2017-04-15 DIAGNOSIS — M5136 Other intervertebral disc degeneration, lumbar region: Secondary | ICD-10-CM | POA: Diagnosis not present

## 2017-04-15 DIAGNOSIS — M5416 Radiculopathy, lumbar region: Secondary | ICD-10-CM | POA: Diagnosis not present

## 2017-04-18 ENCOUNTER — Telehealth (HOSPITAL_COMMUNITY): Payer: Self-pay

## 2017-04-18 DIAGNOSIS — M5136 Other intervertebral disc degeneration, lumbar region: Secondary | ICD-10-CM | POA: Diagnosis not present

## 2017-04-18 DIAGNOSIS — M5416 Radiculopathy, lumbar region: Secondary | ICD-10-CM | POA: Diagnosis not present

## 2017-04-18 NOTE — Telephone Encounter (Signed)
I spoke to Doctor Plovsky who told me to call patient and have her double the Abilify. I called and let her know and she is agreeable to the plan.

## 2017-04-18 NOTE — Telephone Encounter (Signed)
Patient is calling because the increased Abilify is not working. Patient states she is depressed and it is getting worse. She does not follow up until November

## 2017-04-22 DIAGNOSIS — M5136 Other intervertebral disc degeneration, lumbar region: Secondary | ICD-10-CM | POA: Diagnosis not present

## 2017-04-22 DIAGNOSIS — M5416 Radiculopathy, lumbar region: Secondary | ICD-10-CM | POA: Diagnosis not present

## 2017-04-25 DIAGNOSIS — M5136 Other intervertebral disc degeneration, lumbar region: Secondary | ICD-10-CM | POA: Diagnosis not present

## 2017-04-25 DIAGNOSIS — M5416 Radiculopathy, lumbar region: Secondary | ICD-10-CM | POA: Diagnosis not present

## 2017-04-27 DIAGNOSIS — F4323 Adjustment disorder with mixed anxiety and depressed mood: Secondary | ICD-10-CM | POA: Diagnosis not present

## 2017-04-29 ENCOUNTER — Other Ambulatory Visit (HOSPITAL_COMMUNITY): Payer: Self-pay

## 2017-04-29 DIAGNOSIS — M5416 Radiculopathy, lumbar region: Secondary | ICD-10-CM | POA: Diagnosis not present

## 2017-04-29 DIAGNOSIS — M5136 Other intervertebral disc degeneration, lumbar region: Secondary | ICD-10-CM | POA: Diagnosis not present

## 2017-04-29 MED ORDER — ARIPIPRAZOLE 10 MG PO TABS: 10.0000 mg | ORAL_TABLET | Freq: Every day | ORAL | 0 refills | Status: DC

## 2017-05-02 DIAGNOSIS — M5136 Other intervertebral disc degeneration, lumbar region: Secondary | ICD-10-CM | POA: Diagnosis not present

## 2017-05-02 DIAGNOSIS — M5416 Radiculopathy, lumbar region: Secondary | ICD-10-CM | POA: Diagnosis not present

## 2017-05-03 DIAGNOSIS — F4323 Adjustment disorder with mixed anxiety and depressed mood: Secondary | ICD-10-CM | POA: Diagnosis not present

## 2017-05-04 DIAGNOSIS — M545 Low back pain: Secondary | ICD-10-CM | POA: Diagnosis not present

## 2017-05-05 DIAGNOSIS — Z1231 Encounter for screening mammogram for malignant neoplasm of breast: Secondary | ICD-10-CM | POA: Diagnosis not present

## 2017-05-05 DIAGNOSIS — Z01419 Encounter for gynecological examination (general) (routine) without abnormal findings: Secondary | ICD-10-CM | POA: Diagnosis not present

## 2017-05-05 DIAGNOSIS — Z1389 Encounter for screening for other disorder: Secondary | ICD-10-CM | POA: Diagnosis not present

## 2017-05-06 ENCOUNTER — Ambulatory Visit (INDEPENDENT_AMBULATORY_CARE_PROVIDER_SITE_OTHER): Payer: Medicare Other | Admitting: Psychiatry

## 2017-05-06 ENCOUNTER — Encounter (HOSPITAL_COMMUNITY): Payer: Self-pay | Admitting: Psychiatry

## 2017-05-06 VITALS — BP 110/72 | HR 61 | Ht 65.0 in | Wt 131.8 lb

## 2017-05-06 DIAGNOSIS — F320 Major depressive disorder, single episode, mild: Secondary | ICD-10-CM

## 2017-05-06 DIAGNOSIS — M5136 Other intervertebral disc degeneration, lumbar region: Secondary | ICD-10-CM | POA: Diagnosis not present

## 2017-05-06 DIAGNOSIS — F331 Major depressive disorder, recurrent, moderate: Secondary | ICD-10-CM | POA: Diagnosis not present

## 2017-05-06 DIAGNOSIS — M5416 Radiculopathy, lumbar region: Secondary | ICD-10-CM | POA: Diagnosis not present

## 2017-05-06 MED ORDER — CITALOPRAM HYDROBROMIDE 20 MG PO TABS: 20.0000 mg | ORAL_TABLET | Freq: Every day | ORAL | 1 refills | Status: DC

## 2017-05-06 MED ORDER — ARIPIPRAZOLE 10 MG PO TABS: 10.0000 mg | ORAL_TABLET | Freq: Every day | ORAL | 1 refills | Status: DC

## 2017-05-06 NOTE — Progress Notes (Signed)
Patient ID: Sylvia Maldonado, female   DOB: April 10, 1941, 76 y.o.   MRN: [id number] [facility] MD Progress Note  05/06/2017 10:30 AM Sylvia Maldonado  MRN:  [id number] Subjective:  Doing well Principal Problem: Major depression, recurrent, moderate Diagnosis:  Major depression, recurrent,   Today the patient is doing better. On her last visit we slowly reduce her Abilify. She started feeling some minor problems in terms of sleep afterwards. Then she injured her back. She was diagnosed with sciatica. She saw Dr. Alfonso Maldonado who began her on prednisone hydrocodone and Valium. On the prednisone she felt very ill. Emotionally she became impulsive, depressed and anxious.she was very distressed. She stopped it after a few days. She may contact this office and we assumed that this was simply a decline from being off of Abilify. We increased her Abilify back to 5 mg is not better after. We then went to 10 mg but I consider to be the maximum dose. Today the patient is better. Her mood is good. She is back to sleeping and eating well. She had lost her appetite. The patient was very anxious and distressed but now is calmer better. Unfortunately she cannot do any more yoga, cannot walk long distancesand cannot do spin exercises.the patient continues taking Celexa 20 mg. She continues in therapy as well. Overall the patient feels just about back to her baseline. She still has some minor mild pain in her leg but overall it is better. She no longer takes the hydrocodone for the prednisone but her pain doctor is prescribing 10 mg of Valium at night. We talked about the possibility of coming off the Valium and were allowing me to prescribed. The reality is that I would choose not to prescribe persistently. She changed over to me I would taper her off of it. Her choices are that she can continue individual have to get it from the other prescribing physician. The patient is not suicidal. She is functioning actually quite well at this  time. Her physician is that she will never take prednisone again as I do think she has psychological physiological reaction to this agent. I'm not sure what percentage of her emotional distress was related to coming off Abilify but I know the majority of it probably was related to prednisone. Past Medical History:  Diagnosis Date  . Cataract   . Depression   . Diverticulosis   . Dry eyes   . Hx of adenomatous colonic polyps   . Internal hemorrhoids     Past Surgical History:  Procedure Laterality Date  . cataracts surg    . COLONOSCOPY    . EYE SURGERY    . PELVIC FLOOR REPAIR    . TONSILLECTOMY    . VAGINAL HYSTERECTOMY     Family History:  Family History  Problem Relation Age of Onset  . CAD Neg Hx    Family Psychiatric  History:  Social History:  History  Alcohol Use No     History  Drug Use No    Social History   Social History  . Marital status: Single    Spouse name: N/A  . Number of children: N/A  . Years of education: N/A   Social History Main Topics  . Smoking status: Never Smoker  . Smokeless tobacco: Never Used  . Alcohol use No  . Drug use: No  . Sexual activity: Not Currently   Other Topics Concern  . None   Social History Narrative  . None  Additional Social History:                         Sleep: Good  Appetite:  Good  Current Medications: Current Outpatient Prescriptions  Medication Sig Dispense Refill  . ARIPiprazole (ABILIFY) 10 MG tablet Take 1 tablet (10 mg total) by mouth daily. 90 tablet 1  . aspirin 81 MG tablet Take 81 mg by mouth every other day.     . Cholecalciferol (VITAMIN D-3) 1000 UNITS CAPS Take 2,000 Units by mouth daily.    . citalopram (CELEXA) 20 MG tablet Take 1 tablet (20 mg total) by mouth daily. 90 tablet 1  . cycloSPORINE (RESTASIS) 0.05 % ophthalmic emulsion Place 1 drop into both eyes daily.     . diazepam (VALIUM) 10 MG tablet Take 10 mg by mouth 1 day or 1 dose.    . loperamide (IMODIUM A-D) 2  MG tablet Take one tablet at bedtime and one in the AM as needed for diarrhea. (Patient not taking: Reported on 03/16/2017) 180 tablet 3  . Omega-3 Fatty Acids (OMEGA 3 PO) Take 2 capsules by mouth 2 (two) times daily.     . vitamin B-12 (CYANOCOBALAMIN) 1000 MCG tablet Take 1,000 mcg by mouth daily.     No current facility-administered medications for this visit.     Lab Results: No results found for this or any previous visit (from the past 48 hour(s)).  Physical Findings: AIMS:  , ,  ,  ,    CIWA:    COWS:     Musculoskeletal: Strength & Muscle Tone: within normal limits Gait & Station: normal Patient leans: Right  Psychiatric Specialty Exam: ROS  Blood pressure 110/72, pulse 61, height 5\' 5"  (1.651 m), weight 131 lb 12.8 oz (59.8 kg).Body mass index is 21.93 kg/m.  General Appearance: Casual  Eye Contact::  Good  Speech:  Clear and Coherent  Volume:  Normal  Mood:  NA  Affect:  Congruent  Thought Process:  Coherent  Orientation:  Full (Time, Place, and Person)  Thought Content:  WDL  Suicidal Thoughts:  No  Homicidal Thoughts:  No  Memory:  NA  Judgement:  NA  Insight:  Good  Psychomotor Activity:  Normal  Concentration:  Good  Recall:  Good  Fund of Knowledge:Good  Language: Good  Akathisia:  No  Handed:  Right  AIMS (if indicated):     Assets:  Desire for Improvement  ADL's:  Intact  Cognition: WNL  Sleep:       Sylvia Maldonado 05/06/2017, 10:30 AM Patient ID:. At this time the patient will continue taking Abilify 10 mg. She'll continue taking Celexa 20 mg.She'll continue in talking therapy. The patient is functioning very well. 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 She'll continue with contact with her family which is very positive for her. She'll return to see me for just a 15 minute visit in about 6 weeks.

## 2017-05-09 DIAGNOSIS — M5136 Other intervertebral disc degeneration, lumbar region: Secondary | ICD-10-CM | POA: Diagnosis not present

## 2017-05-09 DIAGNOSIS — M5416 Radiculopathy, lumbar region: Secondary | ICD-10-CM | POA: Diagnosis not present

## 2017-05-13 DIAGNOSIS — M5416 Radiculopathy, lumbar region: Secondary | ICD-10-CM | POA: Diagnosis not present

## 2017-05-13 DIAGNOSIS — M5136 Other intervertebral disc degeneration, lumbar region: Secondary | ICD-10-CM | POA: Diagnosis not present

## 2017-05-16 DIAGNOSIS — M5416 Radiculopathy, lumbar region: Secondary | ICD-10-CM | POA: Diagnosis not present

## 2017-05-16 DIAGNOSIS — M5136 Other intervertebral disc degeneration, lumbar region: Secondary | ICD-10-CM | POA: Diagnosis not present

## 2017-05-20 ENCOUNTER — Ambulatory Visit (HOSPITAL_COMMUNITY): Payer: Self-pay | Admitting: Psychiatry

## 2017-05-20 DIAGNOSIS — F4323 Adjustment disorder with mixed anxiety and depressed mood: Secondary | ICD-10-CM | POA: Diagnosis not present

## 2017-05-20 DIAGNOSIS — M5136 Other intervertebral disc degeneration, lumbar region: Secondary | ICD-10-CM | POA: Diagnosis not present

## 2017-05-20 DIAGNOSIS — M5416 Radiculopathy, lumbar region: Secondary | ICD-10-CM | POA: Diagnosis not present

## 2017-05-23 DIAGNOSIS — M5416 Radiculopathy, lumbar region: Secondary | ICD-10-CM | POA: Diagnosis not present

## 2017-05-23 DIAGNOSIS — M5136 Other intervertebral disc degeneration, lumbar region: Secondary | ICD-10-CM | POA: Diagnosis not present

## 2017-05-25 DIAGNOSIS — M5416 Radiculopathy, lumbar region: Secondary | ICD-10-CM | POA: Diagnosis not present

## 2017-05-25 DIAGNOSIS — M5136 Other intervertebral disc degeneration, lumbar region: Secondary | ICD-10-CM | POA: Diagnosis not present

## 2017-05-26 DIAGNOSIS — F4323 Adjustment disorder with mixed anxiety and depressed mood: Secondary | ICD-10-CM | POA: Diagnosis not present

## 2017-05-27 ENCOUNTER — Ambulatory Visit (HOSPITAL_COMMUNITY): Payer: Self-pay | Admitting: Psychiatry

## 2017-05-31 DIAGNOSIS — R51 Headache: Secondary | ICD-10-CM | POA: Diagnosis not present

## 2017-05-31 DIAGNOSIS — T148XXA Other injury of unspecified body region, initial encounter: Secondary | ICD-10-CM | POA: Diagnosis not present

## 2017-05-31 DIAGNOSIS — M5136 Other intervertebral disc degeneration, lumbar region: Secondary | ICD-10-CM | POA: Diagnosis not present

## 2017-06-01 ENCOUNTER — Ambulatory Visit (HOSPITAL_COMMUNITY): Payer: Self-pay | Admitting: Psychiatry

## 2017-06-08 DIAGNOSIS — J101 Influenza due to other identified influenza virus with other respiratory manifestations: Secondary | ICD-10-CM | POA: Diagnosis not present

## 2017-06-10 ENCOUNTER — Encounter (HOSPITAL_COMMUNITY): Payer: Self-pay | Admitting: Psychiatry

## 2017-06-10 ENCOUNTER — Ambulatory Visit (INDEPENDENT_AMBULATORY_CARE_PROVIDER_SITE_OTHER): Payer: Medicare Other | Admitting: Psychiatry

## 2017-06-10 VITALS — BP 126/70 | HR 74 | Ht 65.0 in | Wt 130.2 lb

## 2017-06-10 DIAGNOSIS — F331 Major depressive disorder, recurrent, moderate: Secondary | ICD-10-CM | POA: Diagnosis not present

## 2017-06-10 DIAGNOSIS — F3342 Major depressive disorder, recurrent, in full remission: Secondary | ICD-10-CM

## 2017-06-10 DIAGNOSIS — Z79899 Other long term (current) drug therapy: Secondary | ICD-10-CM

## 2017-06-10 MED ORDER — CITALOPRAM HYDROBROMIDE 20 MG PO TABS: 20.0000 mg | ORAL_TABLET | Freq: Every day | ORAL | 2 refills | Status: DC

## 2017-06-10 MED ORDER — ARIPIPRAZOLE 10 MG PO TABS: 10.0000 mg | ORAL_TABLET | Freq: Every day | ORAL | 2 refills | Status: DC

## 2017-06-10 NOTE — Progress Notes (Signed)
Patient ID: Sylvia Maldonado, female   DOB: August 31, 1940, 76 y.o.   MRN: [id number] [facility] MD Progress Note  06/10/2017 11:17 AM Sylvia Maldonado  MRN:  [id number] Subjective:  Doing well Principal Problem: Major depression, recurrent, moderate Diagnosis:  Major depression, recurrent,   Today the patient is doing well. Her mood is good. She is physically limited somewhat because of back problems and cannot walk long distances. Overall she's very stable. She is a good relationship with her family. The patient is admitted to taking 10 mg of Abilify and never changing again. We shared with her that it wasn't clear if she declined because of reduction of Abilify warfarin was related to her exposure to prednisone. Nonetheless she doesn't want to take a chance. Today reviewed the pros and cons of Abilify and the potential risks of tardive dyskinesia etc. The patient is non-of these side effects at this time. I shared with her that we'll watch closely for this time she is admitted to not changing Abilify continue with 10 mg indefinitely. The patient does not have diabetes she does have well-controlled hypercholesterolemia. She get regular medical care. Recently the patient did have a flu and it did affect her sleep. Sleep this did not get back to normal. She doesn't take naps and she doesn't feel sleepy she has some problems sleeping regularly. He's eating well. She's got good energy. Financially she is very stable. She's made a commitment stay in the community and not her primary care doctor is Dr. Leighton Ruff. The patient enjoys her life. She likes to read Netflix likes music.She doing well with her family. Presently she's not anyone. She takes her medicines just as prescribed.  Past Surgical History:  Procedure Laterality Date  . cataracts surg    . COLONOSCOPY    . EYE SURGERY    . PELVIC FLOOR REPAIR    . TONSILLECTOMY    . VAGINAL HYSTERECTOMY     Family History:  Family History  Problem  Relation Age of Onset  . CAD Neg Hx    Family Psychiatric  History:  Social History:  Social History   Substance and Sexual Activity  Alcohol Use No     Social History   Substance and Sexual Activity  Drug Use No    Social History   Socioeconomic History  . Marital status: Single    Spouse name: None  . Number of children: None  . Years of education: None  . Highest education level: None  Social Needs  . Financial resource strain: None  . Food insecurity - worry: None  . Food insecurity - inability: None  . Transportation needs - medical: None  . Transportation needs - non-medical: None  Occupational History  . None  Tobacco Use  . Smoking status: Never Smoker  . Smokeless tobacco: Never Used  Substance and Sexual Activity  . Alcohol use: No  . Drug use: No  . Sexual activity: Not Currently  Other Topics Concern  . None  Social History Narrative  . None   Additional Social History:                         Sleep: Good  Appetite:  Good  Current Medications: Current Outpatient Medications  Medication Sig Dispense Refill  . ARIPiprazole (ABILIFY) 10 MG tablet Take 1 tablet (10 mg total) by mouth daily. 90 tablet 2  . aspirin 81 MG tablet Take 81 mg by mouth every other day.     Marland Kitchen  Cholecalciferol (VITAMIN D-3) 1000 UNITS CAPS Take 2,000 Units by mouth daily.    . citalopram (CELEXA) 20 MG tablet Take 1 tablet (20 mg total) by mouth daily. 90 tablet 2  . cycloSPORINE (RESTASIS) 0.05 % ophthalmic emulsion Place 1 drop into both eyes daily.     Marland Kitchen loperamide (IMODIUM A-D) 2 MG tablet Take one tablet at bedtime and one in the AM as needed for diarrhea. 180 tablet 3  . Omega-3 Fatty Acids (OMEGA 3 PO) Take 2 capsules by mouth 2 (two) times daily.     . vitamin B-12 (CYANOCOBALAMIN) 1000 MCG tablet Take 1,000 mcg by mouth daily.     No current facility-administered medications for this visit.     Lab Results: No results found for this or any previous  visit (from the past 48 hour(s)).  Physical Findings: AIMS:  , ,  ,  ,    CIWA:    COWS:     Musculoskeletal: Strength & Muscle Tone: within normal limits Gait & Station: normal Patient leans: Right  Psychiatric Specialty Exam: ROS  Blood pressure 126/70, pulse 74, height 5\' 5"  (1.651 m), weight 130 lb 3.2 oz (59.1 kg).Body mass index is 21.67 kg/m.  General Appearance: Casual  Eye Contact::  Good  Speech:  Clear and Coherent  Volume:  Normal  Mood:  NA  Affect:  Congruent  Thought Process:  Coherent  Orientation:  Full (Time, Place, and Person)  Thought Content:  WDL  Suicidal Thoughts:  No  Homicidal Thoughts:  No  Memory:  NA  Judgement:  NA  Insight:  Good  Psychomotor Activity:  Normal  Concentration:  Good  Recall:  Good  Fund of Knowledge:Good  Language: Good  Akathisia:  No  Handed:  Right  AIMS (if indicated):     Assets:  Desire for Improvement  ADL's:  Intact  Cognition: WNL  Sleep:       [name] 06/10/2017, 11:17 AM Patient ID:. Today the patient's problems are history of clinical depression. She takes Celexa 20 mg Abilify 10 mg. She is very stable. She's happy with this. She also continues in talking therapy with Clare  Huppridge. The patient is medically stable.she denies chest pain or shortness of breath. Today we spent 50% of the time talking about her medications and came about her illness. The patient seems to be very stable this time. She is very resilient. The patient return to see me in 4 months for a 30 minute visit. She certainly is not suicidal and she is functioning extremely well.

## 2017-06-14 DIAGNOSIS — R05 Cough: Secondary | ICD-10-CM | POA: Diagnosis not present

## 2017-06-15 ENCOUNTER — Other Ambulatory Visit (HOSPITAL_COMMUNITY): Payer: Self-pay | Admitting: Family Medicine

## 2017-06-15 DIAGNOSIS — R9389 Abnormal findings on diagnostic imaging of other specified body structures: Secondary | ICD-10-CM

## 2017-06-15 DIAGNOSIS — E559 Vitamin D deficiency, unspecified: Secondary | ICD-10-CM | POA: Diagnosis not present

## 2017-06-15 DIAGNOSIS — E7800 Pure hypercholesterolemia, unspecified: Secondary | ICD-10-CM | POA: Diagnosis not present

## 2017-06-15 DIAGNOSIS — R6889 Other general symptoms and signs: Secondary | ICD-10-CM | POA: Diagnosis not present

## 2017-06-17 DIAGNOSIS — E559 Vitamin D deficiency, unspecified: Secondary | ICD-10-CM | POA: Diagnosis not present

## 2017-06-17 DIAGNOSIS — R9389 Abnormal findings on diagnostic imaging of other specified body structures: Secondary | ICD-10-CM | POA: Diagnosis not present

## 2017-06-17 DIAGNOSIS — F339 Major depressive disorder, recurrent, unspecified: Secondary | ICD-10-CM | POA: Diagnosis not present

## 2017-06-17 DIAGNOSIS — E7800 Pure hypercholesterolemia, unspecified: Secondary | ICD-10-CM | POA: Diagnosis not present

## 2017-06-21 ENCOUNTER — Encounter (HOSPITAL_COMMUNITY)
Admission: RE | Admit: 2017-06-21 | Discharge: 2017-06-21 | Disposition: A | Discharge status: Home / Self Care | Payer: Medicare Other | Source: Ambulatory Visit | Attending: Family Medicine | Admitting: Family Medicine

## 2017-06-21 DIAGNOSIS — R9389 Abnormal findings on diagnostic imaging of other specified body structures: Secondary | ICD-10-CM | POA: Insufficient documentation

## 2017-06-21 MED ORDER — TECHNETIUM TC 99M MEDRONATE IV KIT
25.0000 | PACK | Freq: Once | INTRAVENOUS | Status: AC | PRN
  Administered 2017-06-21: 25 via INTRAVENOUS

## 2017-07-13 ENCOUNTER — Encounter (HOSPITAL_COMMUNITY): Payer: Self-pay

## 2017-07-13 ENCOUNTER — Telehealth (HOSPITAL_COMMUNITY): Payer: Self-pay

## 2017-07-13 DIAGNOSIS — M47816 Spondylosis without myelopathy or radiculopathy, lumbar region: Secondary | ICD-10-CM | POA: Diagnosis not present

## 2017-07-13 NOTE — Telephone Encounter (Signed)
Patient is calling to let you know that she is shaking pretty bad on the Abilify 10 mg. She says the medication is working, she just has the shakes. Please review and advise, thank you

## 2017-07-29 ENCOUNTER — Ambulatory Visit (HOSPITAL_COMMUNITY): Payer: Self-pay | Admitting: Psychiatry

## 2017-08-04 DIAGNOSIS — Z961 Presence of intraocular lens: Secondary | ICD-10-CM | POA: Diagnosis not present

## 2017-08-16 DIAGNOSIS — M8588 Other specified disorders of bone density and structure, other site: Secondary | ICD-10-CM | POA: Diagnosis not present

## 2017-08-16 DIAGNOSIS — F339 Major depressive disorder, recurrent, unspecified: Secondary | ICD-10-CM | POA: Diagnosis not present

## 2017-08-16 DIAGNOSIS — E559 Vitamin D deficiency, unspecified: Secondary | ICD-10-CM | POA: Diagnosis not present

## 2017-08-16 DIAGNOSIS — E7800 Pure hypercholesterolemia, unspecified: Secondary | ICD-10-CM | POA: Diagnosis not present

## 2017-08-16 DIAGNOSIS — Z1389 Encounter for screening for other disorder: Secondary | ICD-10-CM | POA: Diagnosis not present

## 2017-08-16 DIAGNOSIS — Z85820 Personal history of malignant melanoma of skin: Secondary | ICD-10-CM | POA: Diagnosis not present

## 2017-08-16 DIAGNOSIS — R5383 Other fatigue: Secondary | ICD-10-CM | POA: Diagnosis not present

## 2017-08-16 DIAGNOSIS — Z0000 Encounter for general adult medical examination without abnormal findings: Secondary | ICD-10-CM | POA: Diagnosis not present

## 2017-09-01 DIAGNOSIS — R197 Diarrhea, unspecified: Secondary | ICD-10-CM | POA: Diagnosis not present

## 2017-09-02 DIAGNOSIS — E778 Other disorders of glycoprotein metabolism: Secondary | ICD-10-CM | POA: Diagnosis not present

## 2017-09-02 DIAGNOSIS — R197 Diarrhea, unspecified: Secondary | ICD-10-CM | POA: Diagnosis not present

## 2017-09-22 DIAGNOSIS — R197 Diarrhea, unspecified: Secondary | ICD-10-CM | POA: Diagnosis not present

## 2017-10-10 DIAGNOSIS — R197 Diarrhea, unspecified: Secondary | ICD-10-CM | POA: Diagnosis not present

## 2017-11-01 DIAGNOSIS — M47816 Spondylosis without myelopathy or radiculopathy, lumbar region: Secondary | ICD-10-CM | POA: Diagnosis not present

## 2017-11-04 ENCOUNTER — Ambulatory Visit (HOSPITAL_COMMUNITY): Payer: Self-pay | Admitting: Psychiatry

## 2017-11-08 ENCOUNTER — Ambulatory Visit (INDEPENDENT_AMBULATORY_CARE_PROVIDER_SITE_OTHER): Payer: Medicare Other | Admitting: Psychiatry

## 2017-11-08 ENCOUNTER — Encounter (HOSPITAL_COMMUNITY): Payer: Self-pay | Admitting: Psychiatry

## 2017-11-08 VITALS — BP 113/77 | HR 63 | Ht 65.0 in | Wt 132.0 lb

## 2017-11-08 DIAGNOSIS — F325 Major depressive disorder, single episode, in full remission: Secondary | ICD-10-CM | POA: Diagnosis not present

## 2017-11-08 MED ORDER — CITALOPRAM HYDROBROMIDE 20 MG PO TABS: 20.0000 mg | ORAL_TABLET | Freq: Every day | ORAL | 2 refills | Status: DC

## 2017-11-08 MED ORDER — ARIPIPRAZOLE 10 MG PO TABS: 10.0000 mg | ORAL_TABLET | Freq: Every day | ORAL | 2 refills | Status: DC

## 2017-11-08 NOTE — Progress Notes (Signed)
Patient ID: Sylvia Maldonado, female   DOB: 03-17-1941, 77 y.o.   MRN: [id number] [facility] MD Progress Note  11/08/2017 1:45 PM Sylvia Maldonado  MRN:  [id number] Subjective:  Doing well Principal Problem: Major depression, recurrent, moderate Diagnosis:  Major depression, recurrent,   Today the patient is doing well. She's in good spirits. She's physically healthy other than some disc disease which makes her unable to exercise intensely. She continues to swim does well. Her mood is great. She doesn't drink alcohol or use drugs. She sleeping and eating very well. She's got good energy. He has 4 grandchildren actually granddaughter were all doing well. The oldest is 66. The patient likes to home she's living. She denies any psychotic symptoms. She denies any chest pain or shortness of breath. She gained a little bit of weight but she is okay with that. She takes her medicines just as prescribed. They're well-tolerated. She's agreed to take Abilify 10 mg for number of years as attempts to reduce his past had to decline. She shows no evidence of tardive dyskinesia. Past Surgical History:  Procedure Laterality Date  . cataracts surg    . COLONOSCOPY    . EYE SURGERY    . PELVIC FLOOR REPAIR    . TONSILLECTOMY    . VAGINAL HYSTERECTOMY     Family History:  Family History  Problem Relation Age of Onset  . CAD Neg Hx    Family Psychiatric  History:  Social History:  Social History   Substance and Sexual Activity  Alcohol Use No     Social History   Substance and Sexual Activity  Drug Use No    Social History   Socioeconomic History  . Marital status: Single    Spouse name: Not on file  . Number of children: Not on file  . Years of education: Not on file  . Highest education level: Not on file  Occupational History  . Not on file  Social Needs  . Financial resource strain: Not on file  . Food insecurity:    Worry: Not on file    Inability: Not on file  . Transportation  needs:    Medical: Not on file    Non-medical: Not on file  Tobacco Use  . Smoking status: Never Smoker  . Smokeless tobacco: Never Used  Substance and Sexual Activity  . Alcohol use: No  . Drug use: No  . Sexual activity: Not Currently  Lifestyle  . Physical activity:    Days per week: Not on file    Minutes per session: Not on file  . Stress: Not on file  Relationships  . Social connections:    Talks on phone: Not on file    Gets together: Not on file    Attends religious service: Not on file    Active member of club or organization: Not on file    Attends meetings of clubs or organizations: Not on file    Relationship status: Not on file  Other Topics Concern  . Not on file  Social History Narrative  . Not on file   Additional Social History:                         Sleep: Good  Appetite:  Good  Current Medications: Current Outpatient Medications  Medication Sig Dispense Refill  . ARIPiprazole (ABILIFY) 10 MG tablet Take 1 tablet (10 mg total) by mouth daily. 90 tablet 2  .  aspirin 81 MG tablet Take 81 mg by mouth every other day.     . Cholecalciferol (VITAMIN D-3) 1000 UNITS CAPS Take 2,000 Units by mouth daily.    . citalopram (CELEXA) 20 MG tablet Take 1 tablet (20 mg total) by mouth daily. 90 tablet 2  . cycloSPORINE (RESTASIS) 0.05 % ophthalmic emulsion Place 1 drop into both eyes daily.     . Omega-3 Fatty Acids (OMEGA 3 PO) Take 2 capsules by mouth 2 (two) times daily.     . vitamin B-12 (CYANOCOBALAMIN) 1000 MCG tablet Take 1,000 mcg by mouth daily.     No current facility-administered medications for this visit.     Lab Results: No results found for this or any previous visit (from the past 48 hour(s)).  Physical Findings: AIMS:  , ,  ,  ,    CIWA:    COWS:     Musculoskeletal: Strength & Muscle Tone: within normal limits Gait & Station: normal Patient leans: Right  Psychiatric Specialty Exam: ROS  Blood pressure 113/77, pulse 63,  height 5\' 5"  (1.651 m), weight 132 lb (59.9 kg), SpO2 96 %.Body mass index is 21.97 kg/m.  General Appearance: Casual  Eye Contact::  Good  Speech:  Clear and Coherent  Volume:  Normal  Mood:  NA  Affect:  Congruent  Thought Process:  Coherent  Orientation:  Full (Time, Place, and Person)  Thought Content:  WDL  Suicidal Thoughts:  No  Homicidal Thoughts:  No  Memory:  NA  Judgement:  NA  Insight:  Good  Psychomotor Activity:  Normal  Concentration:  Good  Recall:  Good  Fund of Knowledge:Good  Language: Good  Akathisia:  No  Handed:  Right  AIMS (if indicated):     Assets:  Desire for Improvement  ADL's:  Intact  Cognition: WNL  Sleep:       Sylvia Maldonado 11/08/2017, 1:45 PM At this time the patient continue taking Celexa 20 mg and continue taking Abilify 10 mg. The patient is positive and optimistic. She continues in therapy by phone with a psychologist in [place]. The patient denies suicidal thinking. She is functioning extremely well. She'll return to see me in 4 months

## 2017-11-09 DIAGNOSIS — D071 Carcinoma in situ of vulva: Secondary | ICD-10-CM | POA: Diagnosis not present

## 2017-11-15 DIAGNOSIS — L309 Dermatitis, unspecified: Secondary | ICD-10-CM | POA: Diagnosis not present

## 2017-12-06 DIAGNOSIS — J069 Acute upper respiratory infection, unspecified: Secondary | ICD-10-CM | POA: Diagnosis not present

## 2018-01-02 DIAGNOSIS — M25512 Pain in left shoulder: Secondary | ICD-10-CM | POA: Diagnosis not present

## 2018-01-06 ENCOUNTER — Telehealth (HOSPITAL_COMMUNITY): Payer: Self-pay

## 2018-01-06 DIAGNOSIS — M25512 Pain in left shoulder: Secondary | ICD-10-CM | POA: Diagnosis not present

## 2018-01-06 NOTE — Telephone Encounter (Signed)
Patient is calling to let you know that she is stopping the Abilify, she said that it makes her to shaky. Please review and advise, thank you

## 2018-01-09 ENCOUNTER — Other Ambulatory Visit (HOSPITAL_COMMUNITY): Payer: Self-pay | Admitting: Psychiatry

## 2018-01-09 NOTE — Telephone Encounter (Signed)
This is Dr. Sherley Bounds patient.

## 2018-01-24 NOTE — Telephone Encounter (Signed)
Patient called back today and states that she is not doing well, depression is coming back. Patient restarted the Abilify yesterday. I attempted to get her an earlier appointment but you have no availability. Please review and advise, thank you

## 2018-01-25 DIAGNOSIS — M5416 Radiculopathy, lumbar region: Secondary | ICD-10-CM | POA: Diagnosis not present

## 2018-02-03 DIAGNOSIS — M25512 Pain in left shoulder: Secondary | ICD-10-CM | POA: Diagnosis not present

## 2018-02-07 DIAGNOSIS — M5416 Radiculopathy, lumbar region: Secondary | ICD-10-CM | POA: Diagnosis not present

## 2018-02-16 DIAGNOSIS — M5416 Radiculopathy, lumbar region: Secondary | ICD-10-CM | POA: Diagnosis not present

## 2018-02-16 DIAGNOSIS — M47896 Other spondylosis, lumbar region: Secondary | ICD-10-CM | POA: Diagnosis not present

## 2018-02-21 DIAGNOSIS — M5416 Radiculopathy, lumbar region: Secondary | ICD-10-CM | POA: Diagnosis not present

## 2018-02-21 DIAGNOSIS — M47816 Spondylosis without myelopathy or radiculopathy, lumbar region: Secondary | ICD-10-CM | POA: Diagnosis not present

## 2018-02-27 DIAGNOSIS — M5416 Radiculopathy, lumbar region: Secondary | ICD-10-CM | POA: Diagnosis not present

## 2018-02-27 DIAGNOSIS — M47896 Other spondylosis, lumbar region: Secondary | ICD-10-CM | POA: Diagnosis not present

## 2018-03-06 DIAGNOSIS — M47896 Other spondylosis, lumbar region: Secondary | ICD-10-CM | POA: Diagnosis not present

## 2018-03-06 DIAGNOSIS — M5416 Radiculopathy, lumbar region: Secondary | ICD-10-CM | POA: Diagnosis not present

## 2018-03-07 DIAGNOSIS — L821 Other seborrheic keratosis: Secondary | ICD-10-CM | POA: Diagnosis not present

## 2018-03-14 DIAGNOSIS — M5416 Radiculopathy, lumbar region: Secondary | ICD-10-CM | POA: Diagnosis not present

## 2018-03-14 DIAGNOSIS — M47816 Spondylosis without myelopathy or radiculopathy, lumbar region: Secondary | ICD-10-CM | POA: Diagnosis not present

## 2018-03-14 DIAGNOSIS — M47896 Other spondylosis, lumbar region: Secondary | ICD-10-CM | POA: Diagnosis not present

## 2018-03-23 DIAGNOSIS — M47896 Other spondylosis, lumbar region: Secondary | ICD-10-CM | POA: Diagnosis not present

## 2018-03-23 DIAGNOSIS — M5416 Radiculopathy, lumbar region: Secondary | ICD-10-CM | POA: Diagnosis not present

## 2018-03-31 ENCOUNTER — Ambulatory Visit (INDEPENDENT_AMBULATORY_CARE_PROVIDER_SITE_OTHER): Payer: Medicare Other | Admitting: Psychiatry

## 2018-03-31 ENCOUNTER — Encounter (HOSPITAL_COMMUNITY): Payer: Self-pay | Admitting: Psychiatry

## 2018-03-31 VITALS — BP 118/72 | HR 64 | Ht 65.0 in | Wt 137.0 lb

## 2018-03-31 DIAGNOSIS — F3342 Major depressive disorder, recurrent, in full remission: Secondary | ICD-10-CM

## 2018-03-31 MED ORDER — ARIPIPRAZOLE 10 MG PO TABS: 10.0000 mg | ORAL_TABLET | Freq: Every day | ORAL | 2 refills | Status: DC

## 2018-03-31 MED ORDER — CITALOPRAM HYDROBROMIDE 20 MG PO TABS: 20.0000 mg | ORAL_TABLET | Freq: Every day | ORAL | 2 refills | Status: DC

## 2018-03-31 NOTE — Progress Notes (Signed)
Patient ID: Sylvia Maldonado, female   DOB: 1941/07/03, 77 y.o.   MRN: [id number] [facility] MD Progress Note  03/31/2018 9:06 AM [name]  MRN:  [id number] Subjective:  Doing well Principal Problem: Major depression, recurrent, moderate Diagnosis:  Major depression, recurrent,  Today the patient is doing very well. The patient is very active. She walks every day). With a close friend. The patient is very healthy. She is no significant medical illnesses she denies chest pain shortness of breath ready other physical complaints. She seen by her primary care Dr. Once a week Dr. Leighton Ruff. The patient is adjusted to her home. It is a small part that she's doing. 6 ounces. The patient is working as a Building control surveyor. She has a client cc for 25 hours a wee and likes her. Patient takes her medicines just as prescribed. She denies depression or anxiety. She denies any psychotic symptoms. She is sleeping and eating very well and has good energy. She can concentrate without a problem. She denies the use of alcohol or drugs. The patient is resistant to having a romantic relationship at this time. Her children are doing well. The patient denies any neurological symptoms area the patient's energy level is good. Her severity is mild terms of her illness. Today she had an aims scale which demonstrated no evidence of tardive dyskinesia. The patient's had a history with Abilify now takes 10 mg. She does very well on it has no side effects from it would like to continue it for a number of years. I agree with disposition. We've attempted to reduce it in the past she had a distinct decline. She also takes her Celexa or regular basis. The patient is not suicidal functions very well. Past Surgical History:  Procedure Laterality Date  . cataracts surg    . COLONOSCOPY    . EYE SURGERY    . PELVIC FLOOR REPAIR    . TONSILLECTOMY    . VAGINAL HYSTERECTOMY     Family History:  Family History  Problem Relation Age  of Onset  . CAD Neg Hx    Family Psychiatric  History:  Social History:  Social History   Substance and Sexual Activity  Alcohol Use No     Social History   Substance and Sexual Activity  Drug Use No    Social History   Socioeconomic History  . Marital status: Single    Spouse name: Not on file  . Number of children: Not on file  . Years of education: Not on file  . Highest education level: Not on file  Occupational History  . Not on file  Social Needs  . Financial resource strain: Not on file  . Food insecurity:    Worry: Not on file    Inability: Not on file  . Transportation needs:    Medical: Not on file    Non-medical: Not on file  Tobacco Use  . Smoking status: Never Smoker  . Smokeless tobacco: Never Used  Substance and Sexual Activity  . Alcohol use: No  . Drug use: No  . Sexual activity: Not Currently  Lifestyle  . Physical activity:    Days per week: Not on file    Minutes per session: Not on file  . Stress: Not on file  Relationships  . Social connections:    Talks on phone: Not on file    Gets together: Not on file    Attends religious service: Not on file  Active member of club or organization: Not on file    Attends meetings of clubs or organizations: Not on file    Relationship status: Not on file  Other Topics Concern  . Not on file  Social History Narrative  . Not on file   Additional Social History:                         Sleep: Good  Appetite:  Good  Current Medications: Current Outpatient Medications  Medication Sig Dispense Refill  . ARIPiprazole (ABILIFY) 10 MG tablet Take 1 tablet (10 mg total) by mouth daily. 90 tablet 2  . aspirin 81 MG tablet Take 81 mg by mouth every other day.     . Cholecalciferol (VITAMIN D-3) 1000 UNITS CAPS Take 2,000 Units by mouth daily.    . citalopram (CELEXA) 20 MG tablet Take 1 tablet (20 mg total) by mouth daily. 90 tablet 2  . cycloSPORINE (RESTASIS) 0.05 % ophthalmic emulsion  Place 1 drop into both eyes daily.     . vitamin B-12 (CYANOCOBALAMIN) 1000 MCG tablet Take 1,000 mcg by mouth daily.     No current facility-administered medications for this visit.     Lab Results: No results found for this or any previous visit (from the past 48 hour(s)).  Physical Findings: AIMS:  , ,  ,  ,    CIWA:    COWS:     Musculoskeletal: Strength & Muscle Tone: within normal limits Gait & Station: normal Patient leans: Right  Psychiatric Specialty Exam: ROS  Blood pressure 118/72, pulse 64, height 5\' 5"  (1.651 m), weight 137 lb (62.1 kg).Body mass index is 22.8 kg/m.  General Appearance: Casual  Eye Contact::  Good  Speech:  Clear and Coherent  Volume:  Normal  Mood:  NA  Affect:  Congruent  Thought Process:  Coherent  Orientation:  Full (Time, Place, and Person)  Thought Content:  WDL  Suicidal Thoughts:  No  Homicidal Thoughts:  No  Memory:  NA  Judgement:  NA  Insight:  Good  Psychomotor Activity:  Normal  Concentration:  Good  Recall:  Good  Fund of Knowledge:Good  Language: Good  Akathisia:  No  Handed:  Right  AIMS (if indicated):     Assets:  Desire for Improvement  ADL's:  Intact  Cognition: WNL  Sleep:       [name] 03/31/2018, 9:06 AM  He only problem the patient has had psychiatrically daily his had a history of significant clinical depression. This is her first problem She takes only Celexa 20 mg which she'll continue Abilify 10 mg. She is tolerating these medicines very well. Noted is that the patient presently is in psychotherapy. She has psychotherapy per the fellow right therapist in [place]. She is contact with her at least once a month.Her second problem with adjustment to this community. The patient has made a few friends.patient at this time has adjusted to her life. The patient is making good adjustments in that she's exercising that she goes to a gym. The patient at this time is very stable. Her third problem was a  reduction in her appetite and weight loss. At this time her appetite is normal and she's gained some weight. Nonetheless the patient's doing very well this time. She'll return to see me in 4 months for a 30 minute visit.

## 2018-04-12 DIAGNOSIS — M47896 Other spondylosis, lumbar region: Secondary | ICD-10-CM | POA: Diagnosis not present

## 2018-04-12 DIAGNOSIS — M5416 Radiculopathy, lumbar region: Secondary | ICD-10-CM | POA: Diagnosis not present

## 2018-04-18 DIAGNOSIS — L570 Actinic keratosis: Secondary | ICD-10-CM | POA: Diagnosis not present

## 2018-04-18 DIAGNOSIS — R609 Edema, unspecified: Secondary | ICD-10-CM | POA: Diagnosis not present

## 2018-04-18 DIAGNOSIS — L821 Other seborrheic keratosis: Secondary | ICD-10-CM | POA: Diagnosis not present

## 2018-04-18 DIAGNOSIS — L814 Other melanin hyperpigmentation: Secondary | ICD-10-CM | POA: Diagnosis not present

## 2018-04-18 DIAGNOSIS — L84 Corns and callosities: Secondary | ICD-10-CM | POA: Diagnosis not present

## 2018-04-18 DIAGNOSIS — Z23 Encounter for immunization: Secondary | ICD-10-CM | POA: Diagnosis not present

## 2018-04-18 DIAGNOSIS — Z85820 Personal history of malignant melanoma of skin: Secondary | ICD-10-CM | POA: Diagnosis not present

## 2018-04-18 DIAGNOSIS — D225 Melanocytic nevi of trunk: Secondary | ICD-10-CM | POA: Diagnosis not present

## 2018-04-18 DIAGNOSIS — M79672 Pain in left foot: Secondary | ICD-10-CM | POA: Diagnosis not present

## 2018-04-18 DIAGNOSIS — Z85828 Personal history of other malignant neoplasm of skin: Secondary | ICD-10-CM | POA: Diagnosis not present

## 2018-04-25 DIAGNOSIS — M47816 Spondylosis without myelopathy or radiculopathy, lumbar region: Secondary | ICD-10-CM | POA: Diagnosis not present

## 2018-05-12 DIAGNOSIS — M47816 Spondylosis without myelopathy or radiculopathy, lumbar region: Secondary | ICD-10-CM | POA: Diagnosis not present

## 2018-05-12 DIAGNOSIS — M545 Low back pain: Secondary | ICD-10-CM | POA: Diagnosis not present

## 2018-05-12 DIAGNOSIS — M5416 Radiculopathy, lumbar region: Secondary | ICD-10-CM | POA: Diagnosis not present

## 2018-05-31 DIAGNOSIS — M47816 Spondylosis without myelopathy or radiculopathy, lumbar region: Secondary | ICD-10-CM | POA: Diagnosis not present

## 2018-06-21 DIAGNOSIS — M25572 Pain in left ankle and joints of left foot: Secondary | ICD-10-CM | POA: Diagnosis not present

## 2018-06-29 DIAGNOSIS — M79672 Pain in left foot: Secondary | ICD-10-CM | POA: Diagnosis not present

## 2018-06-30 DIAGNOSIS — M25572 Pain in left ankle and joints of left foot: Secondary | ICD-10-CM | POA: Diagnosis not present

## 2018-07-07 DIAGNOSIS — Z6822 Body mass index (BMI) 22.0-22.9, adult: Secondary | ICD-10-CM | POA: Diagnosis not present

## 2018-07-07 DIAGNOSIS — Z01419 Encounter for gynecological examination (general) (routine) without abnormal findings: Secondary | ICD-10-CM | POA: Diagnosis not present

## 2018-07-07 DIAGNOSIS — Z1389 Encounter for screening for other disorder: Secondary | ICD-10-CM | POA: Diagnosis not present

## 2018-07-07 DIAGNOSIS — Z1231 Encounter for screening mammogram for malignant neoplasm of breast: Secondary | ICD-10-CM | POA: Diagnosis not present

## 2018-07-07 DIAGNOSIS — M79672 Pain in left foot: Secondary | ICD-10-CM | POA: Diagnosis not present

## 2018-07-11 DIAGNOSIS — R350 Frequency of micturition: Secondary | ICD-10-CM | POA: Diagnosis not present

## 2018-07-11 DIAGNOSIS — R251 Tremor, unspecified: Secondary | ICD-10-CM | POA: Diagnosis not present

## 2018-07-11 DIAGNOSIS — R413 Other amnesia: Secondary | ICD-10-CM | POA: Diagnosis not present

## 2018-07-11 DIAGNOSIS — R2689 Other abnormalities of gait and mobility: Secondary | ICD-10-CM | POA: Diagnosis not present

## 2018-07-18 ENCOUNTER — Ambulatory Visit: Payer: Medicare Other | Admitting: Neurology

## 2018-07-19 ENCOUNTER — Encounter: Payer: Self-pay | Admitting: Neurology

## 2018-07-19 ENCOUNTER — Telehealth: Payer: Self-pay | Admitting: Neurology

## 2018-07-19 ENCOUNTER — Ambulatory Visit (INDEPENDENT_AMBULATORY_CARE_PROVIDER_SITE_OTHER): Payer: Medicare Other | Admitting: Psychiatry

## 2018-07-19 ENCOUNTER — Encounter (HOSPITAL_COMMUNITY): Payer: Self-pay | Admitting: Psychiatry

## 2018-07-19 ENCOUNTER — Ambulatory Visit (INDEPENDENT_AMBULATORY_CARE_PROVIDER_SITE_OTHER): Payer: Medicare Other | Admitting: Neurology

## 2018-07-19 VITALS — BP 108/68 | Ht 65.0 in | Wt 135.0 lb

## 2018-07-19 DIAGNOSIS — G20 Parkinson's disease: Secondary | ICD-10-CM

## 2018-07-19 DIAGNOSIS — F3342 Major depressive disorder, recurrent, in full remission: Secondary | ICD-10-CM

## 2018-07-19 DIAGNOSIS — R413 Other amnesia: Secondary | ICD-10-CM | POA: Diagnosis not present

## 2018-07-19 DIAGNOSIS — G2111 Neuroleptic induced parkinsonism: Secondary | ICD-10-CM | POA: Diagnosis not present

## 2018-07-19 DIAGNOSIS — G20C Parkinsonism, unspecified: Secondary | ICD-10-CM

## 2018-07-19 DIAGNOSIS — M79672 Pain in left foot: Secondary | ICD-10-CM | POA: Diagnosis not present

## 2018-07-19 HISTORY — DX: Parkinsonism, unspecified: G20.C

## 2018-07-19 HISTORY — DX: Parkinson's disease: G20

## 2018-07-19 MED ORDER — ARIPIPRAZOLE 10 MG PO TABS: ORAL_TABLET | ORAL | 1 refills | Status: DC

## 2018-07-19 MED ORDER — ALPRAZOLAM 0.5 MG PO TABS: ORAL_TABLET | ORAL | 0 refills | Status: DC

## 2018-07-19 MED ORDER — CITALOPRAM HYDROBROMIDE 20 MG PO TABS: 20.0000 mg | ORAL_TABLET | Freq: Every day | ORAL | 2 refills | Status: DC

## 2018-07-19 NOTE — Telephone Encounter (Signed)
Medicare/aarp no auth order sent to GI they will reach out to the pt to schedule.

## 2018-07-19 NOTE — Telephone Encounter (Signed)
Patient is aware I gave her GI phone number of [phone] and to give them a call if she has not heard in the next 2-3 business days.  °

## 2018-07-19 NOTE — Progress Notes (Signed)
Reason for visit: Tremor  Referring physician: Dr. Clent Maldonado is a 78 y.o. female  History of present illness:  [name] is a 78 year old right-handed white female with a history of significant depression requiring hospitalization in the past.  The patient is followed by Dr. Casimiro Needle for this.  The patient has been on Abilify taking 10 mg daily over the last 2 years.  Approximately 6 months ago, the patient began noting some alteration in her memory.  She reports some word finding problems and mild short-term memory issues.  These memory issues do not impair her ability to function day-to-day.  The patient lives alone.  She denies any problems with operating motor vehicle, and no problems with directions.  She is able to manage her finances and keep up with her medications and appointments.  Following the visit with her gynecologist on 07 July 2018, it was noted that she had a left hand tremor.  The patient has had some blood work that included a vitamin B12 level that is normal.  The patient has noted some minor alterations in balance over the last 6 months, she reports no falls.  She has noted some problems with micrographia with her handwriting.  She has been told that she has a monotone voice.  The patient has had some urinary incontinence issues over the last 2 months, she is followed by Dr. Lynann Bologna for some low back pain and some left-sided sciatica problems.  The patient denies any family history of tremor or history of memory problems.  Given the constellation of symptoms that have developed, the patient was referred to this office for an evaluation.  The patient has also noted a tendency for her toes to curl under at times.  Past Medical History:  Diagnosis Date  . Cataract   . Depression   . Diverticulosis   . Dry eyes   . Hx of adenomatous colonic polyps   . Internal hemorrhoids     Past Surgical History:  Procedure Laterality Date  . cataracts surg    .  COLONOSCOPY    . EYE SURGERY    . PELVIC FLOOR REPAIR    . TONSILLECTOMY    . VAGINAL HYSTERECTOMY      Family History  Problem Relation Age of Onset  . CAD Neg Hx     Social history:  reports that she has never smoked. She has never used smokeless tobacco. She reports that she does not drink alcohol or use drugs.  Medications:  Prior to Admission medications   Medication Sig Start Date End Date Taking? Authorizing Provider  ARIPiprazole (ABILIFY) 10 MG tablet Take 1 tablet (10 mg total) by mouth daily. 03/31/18 03/31/19 Yes Plovsky, Berneta Sages, MD  aspirin 81 MG tablet Take 81 mg by mouth every other day.    Yes [provider]  Cholecalciferol (VITAMIN D-3) 1000 UNITS CAPS Take 2,000 Units by mouth daily.   Yes [provider]  citalopram (CELEXA) 20 MG tablet Take 1 tablet (20 mg total) by mouth daily. 03/31/18  Yes Plovsky, Berneta Sages, MD  cycloSPORINE (RESTASIS) 0.05 % ophthalmic emulsion Place 1 drop into both eyes daily.    Yes [provider]  etodolac (LODINE) 500 MG tablet etodolac 500 mg tablet   Yes [provider]  vitamin B-12 (CYANOCOBALAMIN) 1000 MCG tablet Take 1,000 mcg by mouth daily.   Yes [provider]      Allergies  Allergen Reactions  . Doxycycline Rash  .  Neosporin [Neomycin-Bacitracin Zn-Polymyx] Swelling and Rash    Swelling and rash  . Penicillins Swelling and Rash    swelling and rash  . Sulfa Antibiotics Swelling and Rash  . Latex Rash    rash  . Prednisone Anxiety  . Acetaminophen     ROS:  Out of a complete 14 system review of symptoms, the patient complains only of the following symptoms, and all other reviewed systems are negative.  Urinary incontinence Joint swelling Memory loss, tremor Restless legs  Blood pressure 132/78, pulse 64, height 5\' 5"  (1.651 m), weight 133 lb (60.3 kg).  Physical Exam  General: The patient is alert and cooperative at the time of the examination.  Eyes: Pupils are  equal, round, and reactive to light. Discs are flat bilaterally.  Neck: The neck is supple, no carotid bruits are noted.  Respiratory: The respiratory examination is clear.  Cardiovascular: The cardiovascular examination reveals a regular rate and rhythm, no obvious murmurs or rubs are noted.  Skin: Extremities are without significant edema.  Neurologic Exam  Mental status: The patient is alert and oriented x 3 at the time of the examination. The patient has apparent normal recent and remote memory, with an apparently normal attention span and concentration ability.  Mini-Mental status examination done today shows a total score of 29/30.  Cranial nerves: Facial symmetry is present. There is good sensation of the face to pinprick and soft touch bilaterally. The strength of the facial muscles and the muscles to head turning and shoulder shrug are normal bilaterally. Speech is well enunciated, no aphasia or dysarthria is noted. Extraocular movements are full. Visual fields are full. The tongue is midline, and the patient has symmetric elevation of the soft palate. No obvious hearing deficits are noted.  Mild masking of the face is seen, decreased eye blink is noted.  Motor: The motor testing reveals 5 over 5 strength of all 4 extremities. Good symmetric motor tone is noted throughout.  Sensory: Sensory testing is intact to pinprick, soft touch, vibration sensation, and position sense on all 4 extremities. No evidence of extinction is noted.  Coordination: Cerebellar testing reveals good finger-nose-finger and heel-to-shin bilaterally.  A resting tremor is noted with the left upper extremity.  Gait and station: Gait is associated with decreased arm swing on the right, the patient has good stability with turns.  Tandem gait is normal. Romberg is negative. No drift is seen.  Reflexes: Deep tendon reflexes are symmetric and normal bilaterally. Toes are downgoing  bilaterally.   Assessment/Plan:  1.  Parkinsonism  2.  Mild memory disturbance   3.  History of depression, chronic use of Abilify  The patient clearly has a syndrome of parkinsonism, but she is on Abilify, and it is possible she may have secondary parkinsonism.  I would recommend that the medication be discontinued if possible, the patient will follow-up in 4 months.  If the patient appears to have ongoing issues with parkinsonism, she will be treated for Parkinson's disease at that time.  We will follow the memory issues over time.  MRI of the brain will be done.  Jill Alexanders MD 07/19/2018 8:10 AM  [facility] [address] [place], [place] [address]  Phone [phone] Fax [phone]

## 2018-07-19 NOTE — Progress Notes (Signed)
Patient ID: Sylvia Maldonado, female   DOB: 1941/07/04, 78 y.o.   MRN: [id number] [facility] MD Progress Note  07/19/2018 2:56 PM Zamora Colton Daw  MRN:  [id number] Subjective:  Doing well Principal Problem: Major depression in remission Diagnosis:  Major depression, recurrent,   This patient comes to our office for treatment of her clinical major depression.  Is been fairly stable.  She continues in psychotherapy for phone with a therapist in [place].  In the last month or so the patient has been having some minor physical complaints.  Her OB/GYN physician noted movements in her hand and referred her to see Dr. Redmond Pulling neurologist.  Patient apparently does have some features of Parkinson's.  He has micrographia.  Her gait seems to change today and she talks with more of a monotone.  My exam shows little evidence of a tremor but she did have some movement of some of her fingers possibly dating the beginning of tardive dyskinesia.  She had no movements in her mouth.  Patient remarkably states that she feels well.  She denies daily depression.  Patient eats well.  She is got good energy.  She denies worthlessness or psychomotor changes.  She denies being suicidal.  Financially she is stable.  She does not like the home she lives in but otherwise life is good.  She enjoys her daughter and HER-2 grandchildren ages 69 and 44.  Patient assisted efforts likes to read and likes to walk.  She is functioning very well.  She came her last visit she had lost weight and admitted to a low appetite.  Now her appetite is back to normal.  The patient denies suicidal thinking.  She likes her she is working.  He works with [employer] about 24 hours a week and loves the person she takes care of.  She clearly has purpose and passion. Past Surgical History:  Procedure Laterality Date  . cataracts surg    . COLONOSCOPY    . EYE SURGERY    . PELVIC FLOOR REPAIR    . TONSILLECTOMY    . VAGINAL HYSTERECTOMY     Family  History:  Family History  Problem Relation Age of Onset  . CAD Neg Hx    Family Psychiatric  History:  Social History:  Social History   Substance and Sexual Activity  Alcohol Use No     Social History   Substance and Sexual Activity  Drug Use No    Social History   Socioeconomic History  . Marital status: Single    Spouse name: Not on file  . Number of children: Not on file  . Years of education: Not on file  . Highest education level: Not on file  Occupational History  . Not on file  Social Needs  . Financial resource strain: Not on file  . Food insecurity:    Worry: Not on file    Inability: Not on file  . Transportation needs:    Medical: Not on file    Non-medical: Not on file  Tobacco Use  . Smoking status: Never Smoker  . Smokeless tobacco: Never Used  Substance and Sexual Activity  . Alcohol use: No  . Drug use: No  . Sexual activity: Not Currently  Lifestyle  . Physical activity:    Days per week: Not on file    Minutes per session: Not on file  . Stress: Not on file  Relationships  . Social connections:    Talks on  phone: Not on file    Gets together: Not on file    Attends religious service: Not on file    Active member of club or organization: Not on file    Attends meetings of clubs or organizations: Not on file    Relationship status: Not on file  Other Topics Concern  . Not on file  Social History Narrative   Right handed    Lives alone at home    No caffeine usage    Additional Social History:                         Sleep: Good  Appetite:  Good  Current Medications: Current Outpatient Medications  Medication Sig Dispense Refill  . ALPRAZolam (XANAX) 0.5 MG tablet Take 2 tablets approximately 45 minutes prior to the MRI study, take a third tablet if needed. 3 tablet 0  . ARIPiprazole (ABILIFY) 10 MG tablet 1  qam  For 2 weeks  Then dc 20 tablet 1  . aspirin 81 MG tablet Take 81 mg by mouth every other day.     .  Cholecalciferol (VITAMIN D-3) 1000 UNITS CAPS Take 2,000 Units by mouth daily.    . citalopram (CELEXA) 20 MG tablet Take 1 tablet (20 mg total) by mouth daily. 90 tablet 2  . cycloSPORINE (RESTASIS) 0.05 % ophthalmic emulsion Place 1 drop into both eyes daily.     Marland Kitchen etodolac (LODINE) 500 MG tablet etodolac 500 mg tablet    . vitamin B-12 (CYANOCOBALAMIN) 1000 MCG tablet Take 1,000 mcg by mouth daily.     No current facility-administered medications for this visit.     Lab Results: No results found for this or any previous visit (from the past 48 hour(s)).  Physical Findings: AIMS:  , ,  ,  ,    CIWA:    COWS:     Musculoskeletal: Strength & Muscle Tone: within normal limits Gait & Station: normal Patient leans: Right  Psychiatric Specialty Exam: ROS  Blood pressure 108/68, height 5' 5"  (1.651 m), weight 135 lb (61.2 kg).Body mass index is 22.47 kg/m.  General Appearance: Casual  Eye Contact::  Good  Speech:  Clear and Coherent  Volume:  Normal  Mood:  NA  Affect:  Congruent  Thought Process:  Coherent  Orientation:  Full (Time, Place, and Person)  Thought Content:  WDL  Suicidal Thoughts:  No  Homicidal Thoughts:  No  Memory:  NA  Judgement:  NA  Insight:  Good  Psychomotor Activity:  Normal  Concentration:  Good  Recall:  Good  Fund of Knowledge:Good  Language: Good  Akathisia:  No  Handed:  Right  AIMS (if indicated):     Assets:  Desire for Improvement  ADL's:  Intact  Cognition: WNL  Sleep:       [name] 07/19/2018, 2:56 PM  This patient has 1 major problem.  The problems does seem to be stable.  She has major clinical depression and takes Celexa 20 mg and takes Abilify 10 mg as adjunct treatment.  Unfortunately the patient is having some side effects from the Abilify.  Her neurologist thinks it may be pseudo-parkinsonian as a side effect.  The patient is worried that she might have Parkinson's.  I have a small concern that this may be the beginning  of tardive dyskinesia in her fingers.  Clearly though she does not show significant tardive dyskinesia symptoms.  Her aims scale needed  today is generally normal.  She has no oral movements.  At this time we will ask her to reduce her Abilify down to 5 mg for 2 weeks and then discontinue.  Return to see me in 7 weeks for reassessment of her depression and her movement complaint.  She has a return appointment to see her neurologist in 4 months.  I truly suspect by 4 months if these symptoms are related to Abilify they should completely resolved.  Otherwise it may in fact represent the beginning of Parkinson's.  At this time it is noted that her neurologist did not give her any medications and I suspect forward to [place] discontinuing her Abilify.  While the patient been resistant to do this completely agrees at this time.  Patient has a MRI coming up of her head.

## 2018-07-28 ENCOUNTER — Ambulatory Visit (HOSPITAL_COMMUNITY): Payer: Self-pay | Admitting: Psychiatry

## 2018-08-01 ENCOUNTER — Other Ambulatory Visit: Payer: Self-pay

## 2018-08-03 ENCOUNTER — Other Ambulatory Visit: Payer: Self-pay

## 2018-08-03 ENCOUNTER — Ambulatory Visit
Admission: RE | Admit: 2018-08-03 | Discharge: 2018-08-03 | Disposition: A | Discharge status: Home / Self Care | Payer: Medicare Other | Source: Ambulatory Visit | Attending: Neurology | Admitting: Neurology

## 2018-08-03 DIAGNOSIS — G2111 Neuroleptic induced parkinsonism: Secondary | ICD-10-CM

## 2018-08-03 DIAGNOSIS — R413 Other amnesia: Secondary | ICD-10-CM | POA: Diagnosis not present

## 2018-08-04 ENCOUNTER — Telehealth: Payer: Self-pay | Admitting: Neurology

## 2018-08-04 NOTE — Telephone Encounter (Signed)
I called the patient back.  The MRI of the brain shows mild atrophy and minimal small vessel disease, she has parkinsonism likely related to medication.  I discussed this issue with her, I will follow-up with her in May, we will follow her memory issues over time.

## 2018-08-04 NOTE — Telephone Encounter (Signed)
Pt is asking Dr Jannifer Franklin to call her back for a better understanding of her MRI results

## 2018-08-04 NOTE — Telephone Encounter (Signed)
I called the patient.  MRI of the brain shows minimal white matter changes, slight atrophy seen, overall is relatively unremarkable.  We will follow the patient over time concerning the memory.   MRI brain 08/03/18:  IMPRESSION: This MRI of the brain without contrast shows the following: 1.    There is minimal generalized cortical atrophy and minimal chronic microvascular ischemic change, normal for age. 2.    There are no acute findings.

## 2018-08-22 DIAGNOSIS — Z0000 Encounter for general adult medical examination without abnormal findings: Secondary | ICD-10-CM | POA: Diagnosis not present

## 2018-08-22 DIAGNOSIS — F339 Major depressive disorder, recurrent, unspecified: Secondary | ICD-10-CM | POA: Diagnosis not present

## 2018-08-22 DIAGNOSIS — R251 Tremor, unspecified: Secondary | ICD-10-CM | POA: Diagnosis not present

## 2018-08-22 DIAGNOSIS — Z85820 Personal history of malignant melanoma of skin: Secondary | ICD-10-CM | POA: Diagnosis not present

## 2018-08-22 DIAGNOSIS — E7800 Pure hypercholesterolemia, unspecified: Secondary | ICD-10-CM | POA: Diagnosis not present

## 2018-08-22 DIAGNOSIS — M8588 Other specified disorders of bone density and structure, other site: Secondary | ICD-10-CM | POA: Diagnosis not present

## 2018-08-22 DIAGNOSIS — M549 Dorsalgia, unspecified: Secondary | ICD-10-CM | POA: Diagnosis not present

## 2018-08-22 DIAGNOSIS — E559 Vitamin D deficiency, unspecified: Secondary | ICD-10-CM | POA: Diagnosis not present

## 2018-08-22 DIAGNOSIS — M25579 Pain in unspecified ankle and joints of unspecified foot: Secondary | ICD-10-CM | POA: Diagnosis not present

## 2018-08-22 DIAGNOSIS — R7989 Other specified abnormal findings of blood chemistry: Secondary | ICD-10-CM | POA: Diagnosis not present

## 2018-08-23 DIAGNOSIS — Z961 Presence of intraocular lens: Secondary | ICD-10-CM | POA: Diagnosis not present

## 2018-08-24 DIAGNOSIS — M47816 Spondylosis without myelopathy or radiculopathy, lumbar region: Secondary | ICD-10-CM | POA: Diagnosis not present

## 2018-08-24 DIAGNOSIS — M5416 Radiculopathy, lumbar region: Secondary | ICD-10-CM | POA: Diagnosis not present

## 2018-08-25 ENCOUNTER — Ambulatory Visit (INDEPENDENT_AMBULATORY_CARE_PROVIDER_SITE_OTHER): Payer: Medicare Other | Admitting: Psychiatry

## 2018-08-25 ENCOUNTER — Encounter (HOSPITAL_COMMUNITY): Payer: Self-pay | Admitting: Psychiatry

## 2018-08-25 VITALS — BP 121/55 | HR 61 | Ht 65.0 in | Wt 131.0 lb

## 2018-08-25 DIAGNOSIS — G2401 Drug induced subacute dyskinesia: Secondary | ICD-10-CM | POA: Diagnosis not present

## 2018-08-25 DIAGNOSIS — G20 Parkinson's disease: Secondary | ICD-10-CM

## 2018-08-25 DIAGNOSIS — F321 Major depressive disorder, single episode, moderate: Secondary | ICD-10-CM

## 2018-08-25 MED ORDER — ARIPIPRAZOLE 10 MG PO TABS: ORAL_TABLET | ORAL | 4 refills | Status: DC

## 2018-08-25 NOTE — Progress Notes (Signed)
Patient ID: Sylvia Maldonado, female   DOB: 17-Apr-1941, 78 y.o.   MRN: [id number] [facility] MD Progress Note  08/25/2018 9:10 AM Sylvia Maldonado  MRN:  [id number] Subjective: I am feel awful; I feel depressed. Principal Problem: Major depression in remission Diagnosis:  Major depression, recurrent,   This patient comes to this clinic with a regular basis for major depression.  She is actually doing very well over the last year.  She was taking Celexa and Abilify.  Based upon my observations that she has some minor evidence of tardive dyskinesia, also noted by her OB/GYN physician when her last visit we slowly reduced her Abilify and discontinued it.  Recently the patient saw Dr. Jannifer Franklin her neurologist who thought she had pseudo-Parkinson's and also would likely like to see her off Abilify.  Today the patient presents feeling extremely depressed.  She is not suicidal but she is having problems sleeping her appetite is reduced her energy level is off.  She is not psychotic.  She is not enjoying life at all.  She feels a distinct change in her mood state.  There is no other psychosocial stressors present.  The patient drinks no alcohol uses no drugs demonstrates a clear sad mood in terms of her expressions.  But most importantly also is the fact that off the Abilify her movements have in fact gotten worse.  Her repeat AIM scale is worse.  She has normal movements in her neck and shortness of thrusting.  Her tongue shows a minor amount of movements her fingers show some movements.  I suspect she has tardive dyskinesia and she is experiencing withdrawal TD.  If her mood was normal I would go ahead and tell her to hold tight and that these movements would likely go away within a few months.  But at this time the patient is so intensely depressed she is essentially begging to go back to Abilify.  Patient understands that this is a side effect from Abilify and ultimately it might get worse.  She is tried Seroquel  in the past and it did not work.  At this time we will go back to 10 mg of Abilify.  If her TD does not improve I would go ahead and look into getting her Ingrezza.   Past Surgical History:  Procedure Laterality Date  . cataracts surg    . COLONOSCOPY    . EYE SURGERY    . PELVIC FLOOR REPAIR    . TONSILLECTOMY    . VAGINAL HYSTERECTOMY     Family History:  Family History  Problem Relation Age of Onset  . CAD Neg Hx    Family Psychiatric  History:  Social History:  Social History   Substance and Sexual Activity  Alcohol Use No     Social History   Substance and Sexual Activity  Drug Use No    Social History   Socioeconomic History  . Marital status: Single    Spouse name: Not on file  . Number of children: Not on file  . Years of education: Not on file  . Highest education level: Not on file  Occupational History  . Not on file  Social Needs  . Financial resource strain: Not on file  . Food insecurity:    Worry: Not on file    Inability: Not on file  . Transportation needs:    Medical: Not on file    Non-medical: Not on file  Tobacco Use  . Smoking status:  Never Smoker  . Smokeless tobacco: Never Used  Substance and Sexual Activity  . Alcohol use: No  . Drug use: No  . Sexual activity: Not Currently  Lifestyle  . Physical activity:    Days per week: Not on file    Minutes per session: Not on file  . Stress: Not on file  Relationships  . Social connections:    Talks on phone: Not on file    Gets together: Not on file    Attends religious service: Not on file    Active member of club or organization: Not on file    Attends meetings of clubs or organizations: Not on file    Relationship status: Not on file  Other Topics Concern  . Not on file  Social History Narrative   Right handed    Lives alone at home    No caffeine usage    Additional Social History:                         Sleep: Good  Appetite:  Good  Current  Medications: Current Outpatient Medications  Medication Sig Dispense Refill  . ARIPiprazole (ABILIFY) 10 MG tablet 1  qam 30 tablet 4  . aspirin 81 MG tablet Take 81 mg by mouth every other day.     . Cholecalciferol (VITAMIN D-3) 1000 UNITS CAPS Take 2,000 Units by mouth daily.    . citalopram (CELEXA) 20 MG tablet Take 1 tablet (20 mg total) by mouth daily. 90 tablet 2  . cycloSPORINE (RESTASIS) 0.05 % ophthalmic emulsion Place 1 drop into both eyes daily.     Marland Kitchen etodolac (LODINE) 500 MG tablet etodolac 500 mg tablet    . vitamin B-12 (CYANOCOBALAMIN) 1000 MCG tablet Take 1,000 mcg by mouth daily.     No current facility-administered medications for this visit.     Lab Results: No results found for this or any previous visit (from the past 48 hour(s)).  Physical Findings: AIMS:  , ,  ,  ,    CIWA:    COWS:     Musculoskeletal: Strength & Muscle Tone: within normal limits Gait & Station: normal Patient leans: Right  Psychiatric Specialty Exam: ROS  Blood pressure (!) 121/55, pulse 61, height 5\' 5"  (1.651 m), weight 131 lb (59.4 kg).Body mass index is 21.8 kg/m.  General Appearance: Casual  Eye Contact::  Good  Speech:  Clear and Coherent  Volume:  Normal  Mood:  NA  Affect:  Congruent  Thought Process:  Coherent  Orientation:  Full (Time, Place, and Person)  Thought Content:  WDL  Suicidal Thoughts:  No  Homicidal Thoughts:  No  Memory:  NA  Judgement:  NA  Insight:  Good  Psychomotor Activity:  Normal  Concentration:  Good  Recall:  Good  Fund of Knowledge:Good  Language: Good  Akathisia:  No  Handed:  Right  AIMS (if indicated):     Assets:  Desire for Improvement  ADL's:  Intact  Cognition: WNL  Sleep:       [name] 08/25/2018, 9:10 AM  This patient has 2 problems.  The first is an active problem of clinical major depression and it showed a distinct decline.  At this time she will continue taking Celexa but most importantly she will restart on  her Abilify.  Her second problem is that of tardive dyskinesia.  At this time going back to the Abilify likely will stop the  withdrawal TD but will confirm that this is in fact tardive dyskinesia due to Parkinson's.  If her TD symptoms continue I will offer her an agent for this condition as well.  Today the patient asked for a therapist.  She has a therapist in [place] that she communicates by phone.  Today we gave her the name of Dr.Jane  Parrin.  And Mrs. Bambi Cottle.  I have asked this patient to return to see me in 2 or 3 weeks and ideally to bring her daughter.  I do not think the patient is suicidal but she is having some trouble functioning.  She clearly is quite distressed.

## 2018-09-15 ENCOUNTER — Ambulatory Visit (INDEPENDENT_AMBULATORY_CARE_PROVIDER_SITE_OTHER): Payer: Medicare Other | Admitting: Psychiatry

## 2018-09-15 ENCOUNTER — Encounter (HOSPITAL_COMMUNITY): Payer: Self-pay | Admitting: Psychiatry

## 2018-09-15 VITALS — BP 111/66 | HR 62 | Ht 65.0 in | Wt 132.0 lb

## 2018-09-15 DIAGNOSIS — F3342 Major depressive disorder, recurrent, in full remission: Secondary | ICD-10-CM | POA: Diagnosis not present

## 2018-09-15 MED ORDER — CITALOPRAM HYDROBROMIDE 20 MG PO TABS: 20.0000 mg | ORAL_TABLET | Freq: Every day | ORAL | 2 refills | Status: DC

## 2018-09-15 MED ORDER — ARIPIPRAZOLE 10 MG PO TABS: ORAL_TABLET | ORAL | 2 refills | Status: DC

## 2018-09-15 NOTE — Progress Notes (Signed)
Patient ID: Sylvia Maldonado, female   DOB: 24-Jan-1941, 78 y.o.   MRN: [id number] [facility] MD Progress Note  09/15/2018 9:41 AM Sylvia Maldonado  MRN:  [id number] Subjective: I am feel awful; I feel depressed. Principal Problem: Major depression in remission Diagnosis: Major depression recurrent  Today the patient is much improved.  She is back on Abilify 10 mg and she notes within days or a week her mood is dramatically better.  She still has some mild dysphoria mainly related to situational circumstances that of having few friends, no romance and disliking the house she lives in.  On the other hand she loves being with her family particularly her grandchildren ages 45 and 42.  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  She finds it very meaningful and she gets paid.  At this time the patient is back to sleeping and eating well.  Her energy level is good.  She denies having tremor and really does not have micrographia.  Her voice seems normal.  She was having some thrusting motions and other movements when she was taken off the Abilify.  I do think she had withdrawal tardive dyskinesia.  I think this implies that she does probably have TD.  Right now she shows no evidence of a tremor or anything to suggest that she has a significant Parkinsons.  The patient had an MRI which came back negative.  She has a return appointment to see her neurologist Dr. Jannifer Franklin in a few months.  At this time I shared with her that there is a probability she has tardive dyskinesia.  I think it is very minimal.  And I will watch it very closely.  The patient is very dedicated to stay on Abilify.  She feels so much better taking Celexa and Abilify together.  Today she had a AIM scale which was completely negative.  She shows no evidence of active TD at this time.  The patient likes to walk.  Her biggest complaints is the lack of friendships.  The  patient is not suicidal and she is functioning extremely well.  Overall her health is excellent.  Her finances are good.    Past Surgical History:  Procedure Laterality Date  . cataracts surg    . COLONOSCOPY    . EYE SURGERY    . PELVIC FLOOR REPAIR    . TONSILLECTOMY    . VAGINAL HYSTERECTOMY     Family History:  Family History  Problem Relation Age of Onset  . CAD Neg Hx    Family Psychiatric  History:  Social History:  Social History   Substance and Sexual Activity  Alcohol Use No     Social History   Substance and Sexual Activity  Drug Use No    Social History   Socioeconomic History  . Marital status: Single    Spouse name: Not on file  . Number of children: Not on file  . Years of education: Not on file  . Highest education level: Not on file  Occupational History  . Not on file  Social Needs  . Financial resource strain: Not on file  . Food insecurity:    Worry: Not on file    Inability: Not on file  . Transportation needs:    Medical: Not on file    Non-medical: Not on file  Tobacco Use  . Smoking status: Never Smoker  .  Smokeless tobacco: Never Used  Substance and Sexual Activity  . Alcohol use: No  . Drug use: No  . Sexual activity: Not Currently  Lifestyle  . Physical activity:    Days per week: Not on file    Minutes per session: Not on file  . Stress: Not on file  Relationships  . Social connections:    Talks on phone: Not on file    Gets together: Not on file    Attends religious service: Not on file    Active member of club or organization: Not on file    Attends meetings of clubs or organizations: Not on file    Relationship status: Not on file  Other Topics Concern  . Not on file  Social History Narrative   Right handed    Lives alone at home    No caffeine usage    Additional Social History:                         Sleep: Good  Appetite:  Good  Current Medications: Current Outpatient Medications   Medication Sig Dispense Refill  . ARIPiprazole (ABILIFY) 10 MG tablet 1  qam 90 tablet 2  . aspirin 81 MG tablet Take 81 mg by mouth every other day.     . Cholecalciferol (VITAMIN D-3) 1000 UNITS CAPS Take 2,000 Units by mouth daily.    . citalopram (CELEXA) 20 MG tablet Take 1 tablet (20 mg total) by mouth daily. 90 tablet 2  . cycloSPORINE (RESTASIS) 0.05 % ophthalmic emulsion Place 1 drop into both eyes daily.     Marland Kitchen etodolac (LODINE) 500 MG tablet etodolac 500 mg tablet    . vitamin B-12 (CYANOCOBALAMIN) 1000 MCG tablet Take 1,000 mcg by mouth daily.     No current facility-administered medications for this visit.     Lab Results: No results found for this or any previous visit (from the past 48 hour(s)).  Physical Findings: AIMS:  , ,  ,  ,    CIWA:    COWS:     Musculoskeletal: Strength & Muscle Tone: within normal limits Gait & Station: normal Patient leans: Right  Psychiatric Specialty Exam: ROS  Blood pressure 111/66, pulse 62, height 5\' 5"  (1.651 m), weight 132 lb (59.9 kg), SpO2 97 %.Body mass index is 21.97 kg/m.  General Appearance: Casual  Eye Contact::  Good  Speech:  Clear and Coherent  Volume:  Normal  Mood:  NA  Affect:  Congruent  Thought Process:  Coherent  Orientation:  Full (Time, Place, and Person)  Thought Content:  WDL  Suicidal Thoughts:  No  Homicidal Thoughts:  No  Memory:  NA  Judgement:  NA  Insight:  Good  Psychomotor Activity:  Normal  Concentration:  Good  Recall:  Good  Fund of Knowledge:Good  Language: Good  Akathisia:  No  Handed:  Right  AIMS (if indicated):     Assets:  Desire for Improvement  ADL's:  Intact  Cognition: WNL  Sleep:       [name] 09/15/2018, 9:41 AM  This patient's problems are major depression.  Now she is back to baseline.  She is sleeping and eating well she has no vegetative symptoms.  She is active and engaging.  She will continue taking Celexa 20 mg and Abilify 10 mg.  I will monitor her  for tardive dyskinesia and if it starts to occur I would consider the possibility of giving  her Ingrezza.  For now the patient will continue these medications and also stay in therapy which he does by the phone.  She was given the name of 2 other local psychotherapist should she want to change this.  This patient return to see me in 3 months.

## 2018-10-25 DIAGNOSIS — M47816 Spondylosis without myelopathy or radiculopathy, lumbar region: Secondary | ICD-10-CM | POA: Diagnosis not present

## 2018-11-17 ENCOUNTER — Telehealth: Payer: Self-pay | Admitting: Neurology

## 2018-11-17 NOTE — Telephone Encounter (Signed)
11-17-18 Pt has called and gave verbal consent to file insurance for a [email or web address] pt email address: [email or web address]  Pt understands that although there may be some limitations with this type of visit, we will take all precautions to reduce any security or privacy concerns.  Pt understands that this will be treated like an in office visit and we will file with pt's insurance, and there may be a patient responsible charge related to this service.

## 2018-11-20 NOTE — Telephone Encounter (Signed)
Link for visit has been sent.

## 2018-11-21 NOTE — Telephone Encounter (Signed)
Spoke with patient and updated EMR. 

## 2018-11-22 ENCOUNTER — Encounter: Payer: Self-pay | Admitting: Neurology

## 2018-11-22 ENCOUNTER — Other Ambulatory Visit: Payer: Self-pay

## 2018-11-22 ENCOUNTER — Ambulatory Visit (INDEPENDENT_AMBULATORY_CARE_PROVIDER_SITE_OTHER): Payer: Medicare Other | Admitting: Neurology

## 2018-11-22 DIAGNOSIS — Z8659 Personal history of other mental and behavioral disorders: Secondary | ICD-10-CM

## 2018-11-22 DIAGNOSIS — G2111 Neuroleptic induced parkinsonism: Secondary | ICD-10-CM

## 2018-11-22 NOTE — Progress Notes (Signed)
     Virtual Visit via Video Note  I connected with Sylvia Maldonado on 11/22/18 at  9:30 AM EDT by a video enabled telemedicine application and verified that I am speaking with the correct person using two identifiers.  Location: Patient: The patient is at home. Provider: Physician is in office.   I discussed the limitations of evaluation and management by telemedicine and the availability of in person appointments. The patient expressed understanding and agreed to proceed.  History of Present Illness: Sylvia Maldonado is a 78 year old right-handed white female with a history of significant depression, followed by Dr. Casimiro Needle.  The patient is on Abilify 10 mg daily, she has developed Parkinson's type symptoms with left-sided resting tremor, she may have dystonic posturing with flexion of the toes on the left foot at times.  She reports no significant issues with mobility, she is ambulating well, she has not had any falls.  She does have micrographia, she is right-handed.  The patient is unable to come off of Abilify, when she did her depression significantly worsened.  She does report some memory issues and therefore would not be a good candidate for Cogentin therapy.  She does not believe that her mobility has changed any since last evaluated.  She is sleeping well at night, she believes that her depression is under good control.  She reports no other new medical issues.  She does indicate that if she kneels down the floor she has difficulty getting up.  She is trying to stay active, she walks on a regular basis.  She continues to have a resting tremor of the left arm.  The patient does not believe there has been a change in her memory, she is still able to operate a motor vehicle without difficulty.  She manages all medications, appointments, and finances.   Observations/Objective: The video evaluation reveals that the patient is alert and cooperative, she has a very flat affect and masking of the  face.  Face is symmetric otherwise, she is able to protrude the tongue midline with good lateral movements of the tongue.  Extraocular movements are full.  The patient is able to perform heel-to-shin and finger-nose-finger bilaterally.  She is able to ambulate without assistance, she is able to perform tandem gait.  Romberg is negative.  A resting tremor is noted with the left arm.  The [facility]- blind evaluation reveals a score of 15/22.  Assessment and Plan: 1.  Parkinson's symptoms, possibly secondary to Abilify  2.  Mild memory disturbance  The patient believes that her cognitive and physical status has remained stable since last seen.  The memory issues will need to be followed over time, we may consider medication for memory.  The patient indicates that she cannot come off of Abilify, we may need to consider low-dose Sinemet in the future.  She will follow-up in 4 months.  Follow Up Instructions: 6-month follow-up with me.   I discussed the assessment and treatment plan with the patient. The patient was provided an opportunity to ask questions and all were answered. The patient agreed with the plan and demonstrated an understanding of the instructions.   The patient was advised to call back or seek an in-person evaluation if the symptoms worsen or if the condition fails to improve as anticipated.  I provided 20 minutes of non-face-to-face time during this encounter.   Kathrynn Ducking, MD

## 2018-11-23 ENCOUNTER — Telehealth: Payer: Self-pay | Admitting: Neurology

## 2018-11-23 NOTE — Telephone Encounter (Signed)
LVM for patient to call back and schedule a 4 month follow-up with Dr. Jannifer Franklin from her virtual visit.

## 2018-11-23 NOTE — Telephone Encounter (Signed)
Patient called wanting to make an appointment with Dr. Jannifer Franklin for September I couldn't find anything.

## 2018-11-23 NOTE — Telephone Encounter (Addendum)
I contacted the pt and was able to schedule her for 03/28/19 at 9:30 am. Pt advised to arrive at 9 am.

## 2018-12-15 ENCOUNTER — Ambulatory Visit (INDEPENDENT_AMBULATORY_CARE_PROVIDER_SITE_OTHER): Payer: Medicare Other | Admitting: Psychiatry

## 2018-12-15 ENCOUNTER — Other Ambulatory Visit: Payer: Self-pay

## 2018-12-15 DIAGNOSIS — F3342 Major depressive disorder, recurrent, in full remission: Secondary | ICD-10-CM

## 2018-12-15 MED ORDER — CITALOPRAM HYDROBROMIDE 20 MG PO TABS: 20.0000 mg | ORAL_TABLET | Freq: Every day | ORAL | 2 refills | Status: DC

## 2018-12-15 MED ORDER — ARIPIPRAZOLE 10 MG PO TABS: ORAL_TABLET | ORAL | 2 refills | Status: DC

## 2018-12-15 NOTE — Progress Notes (Signed)
Patient ID: Sylvia Maldonado, female   DOB: 1941/06/27, 78 y.o.   MRN: [id number] [facility] MD Progress Note  12/15/2018 9:04 AM Sylvia Maldonado  MRN:  [id number] Subjective: I am feel awful; I feel depressed. Principal Problem: Major depression in remission Diagnosis: Major depression recurrent  Today the patient is doing well.  She has no fevers chills or cough.  Medically she is stable.  She continues to have a tremor in her right hand and is being evaluated by Dr. Jannifer Maldonado with an appointment in September.  Emotionally she is very stable.  She enjoys the warmer weather.  She enjoys the sun.  The patient has a few close friends 1 of which she walks with 3 times a week.  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  The patient enjoys her grandchildren her daughter and her son-in-law very much.  Financially the patient is stable.  The patient is sleeping and eating well.  She is got good energy.  She watches no TV but watches movies.  She is very cautious about the coronavirus and takes a lot of precautions.  The patient denies the use of alcohol or drugs.  She shows no evidence of psychosis.  Presently she is an adjunct treatment with 10 mg of Abilify together with the appropriate dose of Celexa.  Efforts to remove the Abilify have led to clear depression and a significant decline.  Once back on the Abilify within weeks she felt dramatically improved and maintains this emotional state.  Past Surgical History:  Procedure Laterality Date  . cataracts surg    . COLONOSCOPY    . EYE Maldonado    . PELVIC FLOOR REPAIR    . TONSILLECTOMY    . VAGINAL HYSTERECTOMY     Family History:  Family History  Problem Relation Age of Onset  . CAD Neg Hx    Family Psychiatric  History:  Social History:  Social History   Substance and Sexual Activity  Alcohol Use No     Social History   Substance and Sexual Activity  Drug Use No    Social History    Socioeconomic History  . Marital status: Single    Spouse name: Not on file  . Number of children: Not on file  . Years of education: Not on file  . Highest education level: Not on file  Occupational History  . Not on file  Social Needs  . Financial resource strain: Not on file  . Food insecurity:    Worry: Not on file    Inability: Not on file  . Transportation needs:    Medical: Not on file    Non-medical: Not on file  Tobacco Use  . Smoking status: Never Smoker  . Smokeless tobacco: Never Used  Substance and Sexual Activity  . Alcohol use: No  . Drug use: No  . Sexual activity: Not Currently  Lifestyle  . Physical activity:    Days per week: Not on file    Minutes per session: Not on file  . Stress: Not on file  Relationships  . Social connections:    Talks on phone: Not on file    Gets together: Not on file    Attends religious service: Not on file    Active member of club or organization: Not on file    Attends meetings of clubs or organizations: Not on file    Relationship status: Not on file  Other Topics Concern  . Not on file  Social History Narrative   Right handed    Lives alone at home    No caffeine usage    Additional Social History:                         Sleep: Good  Appetite:  Good  Current Medications: Current Outpatient Medications  Medication Sig Dispense Refill  . ARIPiprazole (ABILIFY) 10 MG tablet 1  qam 90 tablet 2  . aspirin 81 MG tablet Take 81 mg by mouth every other day.     . Cholecalciferol (VITAMIN D-3) 1000 UNITS CAPS Take 2,000 Units by mouth daily.    . citalopram (CELEXA) 20 MG tablet Take 1 tablet (20 mg total) by mouth daily. 90 tablet 2  . cycloSPORINE (RESTASIS) 0.05 % ophthalmic emulsion Place 1 drop into both eyes daily.     Marland Kitchen etodolac (LODINE) 500 MG tablet etodolac 500 mg tablet    . vitamin B-12 (CYANOCOBALAMIN) 1000 MCG tablet Take 1,000 mcg by mouth daily.     No current facility-administered  medications for this visit.     Lab Results: No results found for this or any previous visit (from the past 48 hour(s)).  Physical Findings: AIMS:  , ,  ,  ,    CIWA:    COWS:     Musculoskeletal: Strength & Muscle Tone: within normal limits Gait & Station: normal Patient leans: Right  Psychiatric Specialty Exam: ROS  There were no vitals taken for this visit.There is no height or weight on file to calculate BMI.  General Appearance: Casual  Eye Contact::  Good  Speech:  Clear and Coherent  Volume:  Normal  Mood:  NA  Affect:  Congruent  Thought Process:  Coherent  Orientation:  Full (Time, Place, and Person)  Thought Content:  WDL  Suicidal Thoughts:  No  Homicidal Thoughts:  No  Memory:  NA  Judgement:  NA  Insight:  Good  Psychomotor Activity:  Normal  Concentration:  Good  Recall:  Good  Fund of Knowledge:Good  Language: Good  Akathisia:  No  Handed:  Right  AIMS (if indicated):     Assets:  Desire for Improvement  ADL's:  Intact  Cognition: WNL  Sleep:       [name] 12/15/2018, 9:04 AM  Today the patient is very stable.  She has 1 major problem that of major depression is in remission.  She takes 10 mg of Abilify and 20 mg of Celexa.  At this time according to her neurologist and I agree with him this does not look like tardive dyskinesia.  Although what is concerning and inconsistent with this is the fact that when we in fact reduced to discontinued her Abilify she started experiencing more movements.  I do not think this can be explained as EPS.  Is more likely to be explained as tardive dyskinesia.  Nonetheless on my exam and importantly 1 the neurologist exam he is suspecting that this is a tremor from her Abilify.  Appropriately he is very concerned about starting Cogentin in this woman who is also being evaluated for questionable cognitive problem.  I agree with being cautious about giving this 78 year old woman Cogentin.  I instead would like to  start her on Artane 2 mg twice daily which I think would be safer.  Nonetheless he will be seeing her in September.  She will address it with  him at that time.  The patient will return to see me in 3 months.

## 2019-01-30 ENCOUNTER — Encounter: Payer: Self-pay | Admitting: Podiatry

## 2019-01-30 ENCOUNTER — Ambulatory Visit (INDEPENDENT_AMBULATORY_CARE_PROVIDER_SITE_OTHER): Payer: Medicare Other | Admitting: Podiatry

## 2019-01-30 ENCOUNTER — Other Ambulatory Visit: Payer: Self-pay

## 2019-01-30 DIAGNOSIS — B351 Tinea unguium: Secondary | ICD-10-CM

## 2019-01-30 DIAGNOSIS — M79675 Pain in left toe(s): Secondary | ICD-10-CM | POA: Diagnosis not present

## 2019-01-30 DIAGNOSIS — L84 Corns and callosities: Secondary | ICD-10-CM | POA: Diagnosis not present

## 2019-01-30 DIAGNOSIS — M79674 Pain in right toe(s): Secondary | ICD-10-CM

## 2019-01-30 NOTE — Progress Notes (Signed)
Complaint:  Visit Type: Patient presents  to my office for  preventative foot care services. Complaint:  Patient says she has developed a painful skin lesion between her first and second toe left foot. This corn has been painful for 2 months.  Patient states" my nails have grown long and thick and become painful to walk and wear shoes" . The patient presents for preventative foot care services.   Podiatric Exam: Vascular: dorsalis pedis and posterior tibial pulses are palpable bilateral. Capillary return is immediate. Temperature gradient is WNL. Skin turgor WNL  Sensorium: Normal Semmes Weinstein monofilament test. Normal tactile sensation bilaterally. Nail Exam: Pt has thick disfigured discolored nails with subungual debris noted hallux nails  B/L. Ulcer Exam: There is no evidence of ulcer or pre-ulcerative changes or infection. Orthopedic Exam: Muscle tone and strength are WNL. No limitations in general ROM. No crepitus or effusions noted. Foot type and digits show no abnormalities. HAV 1st MPJ  B/L.  Hammer toes 2-5  B/l. Skin: No Porokeratosis. No infection or ulcers  Diagnosis:  Onychomycosis, , Pain in right toe, pain in left toes,  Corn 1/2 left foot.  Treatment & Plan Procedures and Treatment: Consent by patient was obtained for treatment procedures.   Debridement of mycotic and hypertrophic toenails, 1 through 5 bilateral and clearing of subungual debris. No ulceration, no infection noted. Corn has developed due to bunion and hammer toe second left foot. Return Visit-Office Procedure: Patient instructed to return to the office for a follow up visit 3 months for continued evaluation and treatment.    Gardiner Barefoot DPM

## 2019-02-13 DIAGNOSIS — S0091XA Abrasion of unspecified part of head, initial encounter: Secondary | ICD-10-CM | POA: Diagnosis not present

## 2019-02-14 ENCOUNTER — Encounter: Payer: Self-pay | Admitting: Neurology

## 2019-02-14 ENCOUNTER — Other Ambulatory Visit: Payer: Self-pay

## 2019-02-14 ENCOUNTER — Ambulatory Visit (INDEPENDENT_AMBULATORY_CARE_PROVIDER_SITE_OTHER): Payer: Medicare Other | Admitting: Neurology

## 2019-02-14 VITALS — BP 114/56 | HR 59 | Temp 98.2°F | Ht 65.0 in | Wt 129.0 lb

## 2019-02-14 DIAGNOSIS — G2111 Neuroleptic induced parkinsonism: Secondary | ICD-10-CM

## 2019-02-14 NOTE — Progress Notes (Signed)
Reason for visit: Secondary parkinsonism  Sylvia Maldonado is an 78 y.o. female  History of present illness:  Sylvia Maldonado is a 78 year old right-handed white female with a history of depression on Abilify.  The patient has gotten some side effects from the medication associated with mild parkinsonism.  She has not had much progression in her walking issues, she remains on Abilify because she has increased depression if she stops the medication.  The patient is walking 12 miles a week, she has not had any falls.  She has minimal issues with tremor.  She does have some mild memory issues but she is living alone and she is managing all of her own affairs.  She is able to operate a motor vehicle without difficulty, she can keep up with medications and appointments and she is able to manage her finances.  She returns to the office today for an evaluation.  She does report some problems with cramps in the feet at night and sometimes during the day.  Past Medical History:  Diagnosis Date  . Cataract   . Depression   . Diverticulosis   . Dry eyes   . Hx of adenomatous colonic polyps   . Internal hemorrhoids   . Parkinsonism ([place]) 07/19/2018   On Abilify    Past Surgical History:  Procedure Laterality Date  . cataracts surg    . COLONOSCOPY    . EYE SURGERY    . PELVIC FLOOR REPAIR    . TONSILLECTOMY    . VAGINAL HYSTERECTOMY      Family History  Problem Relation Age of Onset  . CAD Neg Hx     Social history:  reports that she has never smoked. She has never used smokeless tobacco. She reports that she does not drink alcohol or use drugs.    Allergies  Allergen Reactions  . Doxycycline Rash  . Neosporin [Neomycin-Bacitracin Zn-Polymyx] Swelling and Rash    Swelling and rash  . Penicillins Swelling and Rash    swelling and rash  . Sulfa Antibiotics Swelling and Rash  . Latex Rash    rash  . Prednisone Anxiety  . Acetaminophen     Medications:  Prior to Admission  medications   Medication Sig Start Date End Date Taking? Authorizing Provider  ARIPiprazole (ABILIFY) 10 MG tablet 1  qam 12/15/18  Yes Plovsky, Berneta Sages, MD  aspirin 81 MG tablet Take 81 mg by mouth every other day.    Yes [provider]  Cholecalciferol (VITAMIN D-3) 1000 UNITS CAPS Take 2,000 Units by mouth daily.   Yes [provider]  citalopram (CELEXA) 20 MG tablet Take 1 tablet (20 mg total) by mouth daily. 12/15/18  Yes Plovsky, Berneta Sages, MD  cycloSPORINE (RESTASIS) 0.05 % ophthalmic emulsion Place 1 drop into both eyes daily.    Yes [provider]  etodolac (LODINE) 500 MG tablet etodolac 500 mg tablet   Yes [provider]  mupirocin ointment (BACTROBAN) 2 % Apply to facial cut twice daily x a week 02/13/19  Yes [provider]  traMADol (ULTRAM) 50 MG tablet TK 1 T PO Q 8 TO 12 H PRN 01/25/19  Yes [provider]  vitamin B-12 (CYANOCOBALAMIN) 1000 MCG tablet Take 1,000 mcg by mouth daily.   Yes [provider]    ROS:  Out of a complete 14 system review of symptoms, the patient complains only of the following symptoms, and all other reviewed systems are negative.  Foot cramps  Hammertoes Tremor Memory problems  Blood pressure (!) 114/56, pulse (!) 59, temperature 98.2 F (36.8 C), height 5\' 5"  (1.651 m), weight 129 lb (58.5 kg).  Physical Exam  General: The patient is alert and cooperative at the time of the examination.  Skin: No significant peripheral edema is noted.   Neurologic Exam  Mental status: The patient is alert and oriented x 3 at the time of the examination. The patient has apparent normal recent and remote memory, with an apparently normal attention span and concentration ability.   Cranial nerves: Facial symmetry is present. Speech is normal, no aphasia or dysarthria is noted. Extraocular movements are full. Visual fields are full.  Mild masking of the face is seen.  Motor: The patient has good  strength in all 4 extremities.  Sensory examination: Soft touch sensation is symmetric on the face, arms, and legs.  Coordination: The patient has good finger-nose-finger and heel-to-shin bilaterally.  Gait and station: The patient has a normal gait, the patient does have some slight decrease in arm swing on the right.  She is able to stand from a seated position with arms crossed. Tandem gait is normal. Romberg is negative. No drift is seen.  Reflexes: Deep tendon reflexes are symmetric.   Assessment/Plan:  1.  Mild secondary parkinsonism  2.  Mild memory disorder  3.  Foot cramps  The memory issues will need to be followed over time.  If the patient develops more prominent features of parkinsonism, we will add low-dose Sinemet to the regimen.  She may try magnesium supplementation for the leg and foot cramps.  She will follow-up here in 6 months.  Jill Alexanders MD 02/14/2019 1:36 PM  [facility] [address] [place], [place] [address]  Phone [phone] Fax [phone]

## 2019-02-20 DIAGNOSIS — M47816 Spondylosis without myelopathy or radiculopathy, lumbar region: Secondary | ICD-10-CM | POA: Diagnosis not present

## 2019-03-16 ENCOUNTER — Other Ambulatory Visit: Payer: Self-pay

## 2019-03-16 ENCOUNTER — Ambulatory Visit (HOSPITAL_COMMUNITY): Payer: Medicare Other | Admitting: Psychiatry

## 2019-03-16 ENCOUNTER — Ambulatory Visit (INDEPENDENT_AMBULATORY_CARE_PROVIDER_SITE_OTHER): Payer: Medicare Other | Admitting: Psychiatry

## 2019-03-16 DIAGNOSIS — F325 Major depressive disorder, single episode, in full remission: Secondary | ICD-10-CM | POA: Diagnosis not present

## 2019-03-16 MED ORDER — CITALOPRAM HYDROBROMIDE 20 MG PO TABS: 20.0000 mg | ORAL_TABLET | Freq: Every day | ORAL | 2 refills | Status: DC

## 2019-03-16 MED ORDER — ARIPIPRAZOLE 10 MG PO TABS: ORAL_TABLET | ORAL | 2 refills | Status: DC

## 2019-03-16 NOTE — Progress Notes (Signed)
Patient ID: Sylvia Maldonado, female   DOB: 02-14-1941, 78 y.o.   MRN: [id number] [facility] MD Progress Note  03/16/2019 8:48 AM [name]  MRN:  [id number] Subjective: I am feel awful; I feel depressed. Principal Problem: Major depression in remission Diagnosis: Major depression recurrent  This patient has been treated in our clinic for well over 2 years for major depression.  At this time she is in remission.  She also has a mild nearly notable tremor in her hand that causes no dysfunction in which she can barely appreciate.  She did see her neurologist who told her he thought it might be related more so to the medicines but not really due to specifically tardive dyskinesia.  Efforts to reduce Abilify have led to a dramatic decline producing very comfortable depression.  The possibility of trying again to reduce it in the future is not out of the question.  For now the patient denies daily depression, is sleeping and eating well and has good energy.  She is walking a great deal.  She watches TV can think and concentrate without problem.  She drives with no problem.  She takes care of all her basic and institutional ADLs.  She is never had any psychosis.  Financially she is stable.  She denies chest pain shortness of breath or any applications of a respiratory infection.  The patient has 4 grandchildren ages 72, 2110 and 4.  They are all doing well.  She sees some of them at dinner once a week.  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  The patient finds it important to gives her focus and purpose.  The patient is very stable at this time.   Past Surgical History:  Procedure Laterality Date  . cataracts surg    . COLONOSCOPY    . EYE SURGERY    . PELVIC FLOOR REPAIR    . TONSILLECTOMY    . VAGINAL HYSTERECTOMY     Family History:  Family History  Problem Relation Age of Onset  . CAD Neg Hx    Family Psychiatric  History:  Social  History:  Social History   Substance and Sexual Activity  Alcohol Use No     Social History   Substance and Sexual Activity  Drug Use No    Social History   Socioeconomic History  . Marital status: Single    Spouse name: Not on file  . Number of children: Not on file  . Years of education: Not on file  . Highest education level: Not on file  Occupational History  . Not on file  Social Needs  . Financial resource strain: Not on file  . Food insecurity    Worry: Not on file    Inability: Not on file  . Transportation needs    Medical: Not on file    Non-medical: Not on file  Tobacco Use  . Smoking status: Never Smoker  . Smokeless tobacco: Never Used  Substance and Sexual Activity  . Alcohol use: No  . Drug use: No  . Sexual activity: Not Currently  Lifestyle  . Physical activity    Days per week: Not on file    Minutes per session: Not on file  . Stress: Not on file  Relationships  . Social Herbalist on phone: Not on file    Gets together: Not on file    Attends religious service: Not on  file    Active member of club or organization: Not on file    Attends meetings of clubs or organizations: Not on file    Relationship status: Not on file  Other Topics Concern  . Not on file  Social History Narrative   Right handed    Lives alone at home    No caffeine usage    Additional Social History:                         Sleep: Good  Appetite:  Good  Current Medications: Current Outpatient Medications  Medication Sig Dispense Refill  . ARIPiprazole (ABILIFY) 10 MG tablet 1  qam 90 tablet 2  . aspirin 81 MG tablet Take 81 mg by mouth every other day.     . Cholecalciferol (VITAMIN D-3) 1000 UNITS CAPS Take 2,000 Units by mouth daily.    . citalopram (CELEXA) 20 MG tablet Take 1 tablet (20 mg total) by mouth daily. 90 tablet 2  . cycloSPORINE (RESTASIS) 0.05 % ophthalmic emulsion Place 1 drop into both eyes daily.     Marland Kitchen etodolac (LODINE)  500 MG tablet etodolac 500 mg tablet    . mupirocin ointment (BACTROBAN) 2 % Apply to facial cut twice daily x a week    . traMADol (ULTRAM) 50 MG tablet TK 1 T PO Q 8 TO 12 H PRN    . vitamin B-12 (CYANOCOBALAMIN) 1000 MCG tablet Take 1,000 mcg by mouth daily.     No current facility-administered medications for this visit.     Lab Results: No results found for this or any previous visit (from the past 48 hour(s)).  Physical Findings: AIMS:  , ,  ,  ,    CIWA:    COWS:     Musculoskeletal: Strength & Muscle Tone: within normal limits Gait & Station: normal Patient leans: Right  Psychiatric Specialty Exam: ROS  There were no vitals taken for this visit.There is no height or weight on file to calculate BMI.  General Appearance: Casual  Eye Contact::  Good  Speech:  Clear and Coherent  Volume:  Normal  Mood:  NA  Affect:  Congruent  Thought Process:  Coherent  Orientation:  Full (Time, Place, and Person)  Thought Content:  WDL  Suicidal Thoughts:  No  Homicidal Thoughts:  No  Memory:  NA  Judgement:  NA  Insight:  Good  Psychomotor Activity:  Normal  Concentration:  Good  Recall:  Good  Fund of Knowledge:Good  Language: Good  Akathisia:  No  Handed:  Right  AIMS (if indicated):     Assets:  Desire for Improvement  ADL's:  Intact  Cognition: WNL  Sleep:       [name] 03/16/2019, 8:48 AM  Today the patient is stable.  She is doing well.  She will continue taking Celexa 20 mg and Abilify 10 mg.  Her only problem is that of major depression which I believe it is in remission at this time.  She is not in therapy at this time.  The patient is functioning very well.  She will return to see me in 5 months.

## 2019-03-28 ENCOUNTER — Ambulatory Visit: Payer: Self-pay | Admitting: Neurology

## 2019-04-24 DIAGNOSIS — D225 Melanocytic nevi of trunk: Secondary | ICD-10-CM | POA: Diagnosis not present

## 2019-04-24 DIAGNOSIS — Z85828 Personal history of other malignant neoplasm of skin: Secondary | ICD-10-CM | POA: Diagnosis not present

## 2019-04-24 DIAGNOSIS — L814 Other melanin hyperpigmentation: Secondary | ICD-10-CM | POA: Diagnosis not present

## 2019-04-24 DIAGNOSIS — Z85820 Personal history of malignant melanoma of skin: Secondary | ICD-10-CM | POA: Diagnosis not present

## 2019-04-24 DIAGNOSIS — Z23 Encounter for immunization: Secondary | ICD-10-CM | POA: Diagnosis not present

## 2019-04-24 DIAGNOSIS — L821 Other seborrheic keratosis: Secondary | ICD-10-CM | POA: Diagnosis not present

## 2019-05-08 ENCOUNTER — Encounter: Payer: Self-pay | Admitting: Podiatry

## 2019-05-08 ENCOUNTER — Other Ambulatory Visit: Payer: Self-pay

## 2019-05-08 ENCOUNTER — Ambulatory Visit (INDEPENDENT_AMBULATORY_CARE_PROVIDER_SITE_OTHER): Payer: Medicare Other | Admitting: Podiatry

## 2019-05-08 DIAGNOSIS — M79674 Pain in right toe(s): Secondary | ICD-10-CM | POA: Diagnosis not present

## 2019-05-08 DIAGNOSIS — M79675 Pain in left toe(s): Secondary | ICD-10-CM | POA: Diagnosis not present

## 2019-05-08 DIAGNOSIS — B351 Tinea unguium: Secondary | ICD-10-CM

## 2019-05-08 DIAGNOSIS — M2010 Hallux valgus (acquired), unspecified foot: Secondary | ICD-10-CM

## 2019-05-08 DIAGNOSIS — M2040 Other hammer toe(s) (acquired), unspecified foot: Secondary | ICD-10-CM

## 2019-05-08 NOTE — Progress Notes (Signed)
Complaint:  Visit Type: Patient presents  to my office for  preventative foot care services. Complaint:  Patient says she has developed a painful skin lesion between her first and second toe left foot. This corn has been painful for 2 months.  Patient states" my nails have grown long and thick and become painful to walk and wear shoes" . The patient presents for preventative foot care services.   Podiatric Exam: Vascular: dorsalis pedis and posterior tibial pulses are palpable bilateral. Capillary return is immediate. Temperature gradient is WNL. Skin turgor WNL  Sensorium: Normal Semmes Weinstein monofilament test. Normal tactile sensation bilaterally. Nail Exam: Pt has thick disfigured discolored nails with subungual debris noted hallux nails  B/L. Ulcer Exam: There is no evidence of ulcer or pre-ulcerative changes or infection. Orthopedic Exam: Muscle tone and strength are WNL. No limitations in general ROM. No crepitus or effusions noted. Foot type and digits show no abnormalities. HAV 1st MPJ  B/L.  Hammer toes 2-5  B/l. Skin: No Porokeratosis. No infection or ulcers  Diagnosis:  Onychomycosis, , Pain in right toe, pain in left toes,    Treatment & Plan Procedures and Treatment: Consent by patient was obtained for treatment procedures.   Debridement of mycotic and hypertrophic toenails, 1 through 5 bilateral and clearing of subungual debris. No ulceration, no infection noted. Corn has developed due to bunion and hammer toe second left foot.  Padding dispensed for bone spur second toe left foot. Return Visit-Office Procedure: Patient instructed to return to the office for a follow up visit 3 months for continued evaluation and treatment.    Gardiner Barefoot DPM

## 2019-05-22 DIAGNOSIS — M160 Bilateral primary osteoarthritis of hip: Secondary | ICD-10-CM | POA: Diagnosis not present

## 2019-05-22 DIAGNOSIS — M7062 Trochanteric bursitis, left hip: Secondary | ICD-10-CM | POA: Diagnosis not present

## 2019-05-22 DIAGNOSIS — M47816 Spondylosis without myelopathy or radiculopathy, lumbar region: Secondary | ICD-10-CM | POA: Diagnosis not present

## 2019-05-22 DIAGNOSIS — M7061 Trochanteric bursitis, right hip: Secondary | ICD-10-CM | POA: Diagnosis not present

## 2019-08-03 DIAGNOSIS — Z01419 Encounter for gynecological examination (general) (routine) without abnormal findings: Secondary | ICD-10-CM | POA: Diagnosis not present

## 2019-08-03 DIAGNOSIS — Z1231 Encounter for screening mammogram for malignant neoplasm of breast: Secondary | ICD-10-CM | POA: Diagnosis not present

## 2019-08-03 DIAGNOSIS — Z1389 Encounter for screening for other disorder: Secondary | ICD-10-CM | POA: Diagnosis not present

## 2019-08-06 ENCOUNTER — Telehealth: Payer: Self-pay | Admitting: Neurology

## 2019-08-06 NOTE — Telephone Encounter (Signed)
Pt has called asking if she could be seen earlier than Feb f/u appointment.  Pt expressed that she is having the following symptoms: leaning over, shufflig & left hand tremors.  Pt would like a call from RN to discuss.

## 2019-08-06 NOTE — Telephone Encounter (Signed)
I called pt that she has an appt tomorrow at 1015 tomorrow and to check in at 0945am. I advise her to be safe and practice safety precautions to reduce falls. Pt will discuss with Judson Roch NP her concerns with leaning over,shuffling and left hand tremors. Pt verbalized understanding.

## 2019-08-06 NOTE — Progress Notes (Signed)
PATIENT: Sylvia Maldonado DOB: Mar 12, 1941  REASON FOR VISIT: follow up HISTORY FROM: patient  HISTORY OF PRESENT ILLNESS: Today 08/07/19 Sylvia Maldonado is a 79 year old female with history of depression on Abilify.  She has had some side effects from the medication associated with mild parkinsonism.  She remains on Abilify because she has increased depression if she stops the medication.  She has reported memory issues, therefore would not be a good candidate for Cogentin.She has a resting tremor of the left hand.  She also reports her doctor has noticed pill-rolling in her left hand, but she does not notice it.  She reports some trouble with her short-term memory, feels she is more forgetful.  She lives alone, drives a car without difficulty.  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  She has not had any falls.  She feels her balance is off at times.  She reports she walks routinely for exercise.  She reports good and bad days with her depression, overall, needs to stay on Abilify.  She presents today for evaluation unaccompanied.  HISTORY 02/14/2019 Dr. Jannifer Franklin: Sylvia Maldonado is a 79 year old right-handed white female with a history of depression on Abilify.  The patient has gotten some side effects from the medication associated with mild parkinsonism.  She has not had much progression in her walking issues, she remains on Abilify because she has increased depression if she stops the medication.  The patient is walking 12 miles a week, she has not had any falls.  She has minimal issues with tremor.  She does have some mild memory issues but she is living alone and she is managing all of her own affairs.  She is able to operate a motor vehicle without difficulty, she can keep up with medications and appointments and she is able to manage her finances.  She returns to the office today for an evaluation.  She does report some problems with cramps in the feet at night and sometimes  during the day.  REVIEW OF SYSTEMS: Out of a complete 14 system review of symptoms, the patient complains only of the following symptoms, and all other reviewed systems are negative.  Tremor, memory loss  ALLERGIES: Allergies  Allergen Reactions  . Doxycycline Rash  . Neosporin [Neomycin-Bacitracin Zn-Polymyx] Swelling and Rash    Swelling and rash  . Penicillins Swelling and Rash    swelling and rash  . Sulfa Antibiotics Swelling and Rash  . Latex Rash    rash  . Prednisone Anxiety  . Acetaminophen     HOME MEDICATIONS: Outpatient Medications Prior to Visit  Medication Sig Dispense Refill  . ARIPiprazole (ABILIFY) 10 MG tablet 1  qam 90 tablet 2  . aspirin 81 MG tablet Take 81 mg by mouth every other day.     . Cholecalciferol (VITAMIN D-3) 1000 UNITS CAPS Take 2,000 Units by mouth daily.    . citalopram (CELEXA) 20 MG tablet Take 1 tablet (20 mg total) by mouth daily. 90 tablet 2  . cycloSPORINE (RESTASIS) 0.05 % ophthalmic emulsion Place 1 drop into both eyes daily.     Marland Kitchen etodolac (LODINE) 500 MG tablet etodolac 500 mg tablet    . traMADol (ULTRAM) 50 MG tablet TK 1 T PO Q 8 TO 12 H PRN    . vitamin B-12 (CYANOCOBALAMIN) 1000 MCG tablet Take 1,000 mcg by mouth daily.    . mupirocin ointment (BACTROBAN) 2 % Apply to facial cut twice daily x  a week     No facility-administered medications prior to visit.    PAST MEDICAL HISTORY: Past Medical History:  Diagnosis Date  . Cataract   . Depression   . Diverticulosis   . Dry eyes   . Hx of adenomatous colonic polyps   . Internal hemorrhoids   . Parkinsonism ([place]) 07/19/2018   On Abilify    PAST SURGICAL HISTORY: Past Surgical History:  Procedure Laterality Date  . cataracts surg    . COLONOSCOPY    . EYE SURGERY    . PELVIC FLOOR REPAIR    . TONSILLECTOMY    . VAGINAL HYSTERECTOMY      FAMILY HISTORY: Family History  Problem Relation Age of Onset  . CAD Neg Hx     SOCIAL HISTORY: Social History    Socioeconomic History  . Marital status: Single    Spouse name: Not on file  . Number of children: Not on file  . Years of education: Not on file  . Highest education level: Not on file  Occupational History  . Not on file  Tobacco Use  . Smoking status: Never Smoker  . Smokeless tobacco: Never Used  Substance and Sexual Activity  . Alcohol use: No  . Drug use: No  . Sexual activity: Not Currently  Other Topics Concern  . Not on file  Social History Narrative   Right handed    Lives alone at home    No caffeine usage    Social Determinants of Health   Financial Resource Strain:   . Difficulty of Paying Living Expenses: Not on file  Food Insecurity:   . Worried About Charity fundraiser in the Last Year: Not on file  . Ran Out of Food in the Last Year: Not on file  Transportation Needs:   . Lack of Transportation (Medical): Not on file  . Lack of Transportation (Non-Medical): Not on file  Physical Activity:   . Days of Exercise per Week: Not on file  . Minutes of Exercise per Session: Not on file  Stress:   . Feeling of Stress : Not on file  Social Connections:   . Frequency of Communication with Friends and Family: Not on file  . Frequency of Social Gatherings with Friends and Family: Not on file  . Attends Religious Services: Not on file  . Active Member of Clubs or Organizations: Not on file  . Attends Archivist Meetings: Not on file  . Marital Status: Not on file  Intimate Partner Violence:   . Fear of Current or Ex-Partner: Not on file  . Emotionally Abused: Not on file  . Physically Abused: Not on file  . Sexually Abused: Not on file   PHYSICAL EXAM  Vitals:   08/07/19 1004  BP: 106/60  Pulse: 60  Temp: (!) 96.9 F (36.1 C)  Weight: 125 lb 6.4 oz (56.9 kg)  Height: 5\' 4"  (1.626 m)   Body mass index is 21.52 kg/m.  Generalized: Well developed, in no acute distress  MMSE - Mini Mental State Exam 08/07/2019 07/19/2018  Orientation to  time 4 5  Orientation to Place 5 5  Registration 3 3  Attention/ Calculation 5 5  Recall 3 2  Language- name 2 objects 2 2  Language- repeat 1 1  Language- follow 3 step command 3 3  Language- read & follow direction 1 1  Write a sentence 1 1  Copy design 1 1  Total score 29 29  Neurological examination  Mentation: Alert oriented to time, place, history taking. Follows all commands speech and language fluent, mild masking of the face was seen Cranial nerve II-XII: Pupils were equal round reactive to light. Extraocular movements were full, visual field were full on confrontational test. Facial sensation and strength were normal. Head turning and shoulder shrug  were normal and symmetric. Motor: The motor testing reveals 5 over 5 strength of all 4 extremities. Good symmetric motor tone is noted throughout.  Resting tremor to left hand was noted Sensory: Sensory testing is intact to soft touch on all 4 extremities. No evidence of extinction is noted.  Coordination: Cerebellar testing reveals good finger-nose-finger and heel-to-shin bilaterally.  Gait and station: Able to stand from seated position with hands across the chest, gait is normal, good stride, gait is stable, decreased arm swing on the right, tandem gait is normal Reflexes: Deep tendon reflexes are symmetric and normal bilaterally.   DIAGNOSTIC DATA (LABS, IMAGING, TESTING) - I reviewed patient records, labs, notes, testing and imaging myself where available.  Lab Results  Component Value Date   WBC 4.3 06/26/2013   HGB 13.3 06/26/2013   HCT 40.3 06/26/2013   MCV 90.8 06/26/2013   PLT 167 06/26/2013      Component Value Date/Time   NA 138 06/26/2013 2230   K 4.2 06/26/2013 2230   CL 103 06/26/2013 2230   CO2 27 06/26/2013 2230   GLUCOSE 116 (H) 06/26/2013 2230   BUN 28 (H) 06/26/2013 2230   CREATININE 0.68 06/26/2013 2230   CALCIUM 9.6 06/26/2013 2230   GFRNONAA 85 (L) 06/26/2013 2230   GFRAA >90 06/26/2013 2230    No results found for: CHOL, HDL, LDLCALC, LDLDIRECT, TRIG, CHOLHDL No results found for: HGBA1C No results found for: VITAMINB12 No results found for: TSH  ASSESSMENT AND PLAN 79 y.o. year old female  has a past medical history of Cataract, Depression, Diverticulosis, Dry eyes, adenomatous colonic polyps, Internal hemorrhoids, and Parkinsonism ([place]) (07/19/2018). here with:  1.  Parkinson's symptoms, possibly secondary to Abilify 2.  Mild memory disturbance  Overall, her symptoms remain stable.  She has remained on Abilify, coming off the medication, significantly worsened her depression.  On exam, she has mild to moderate resting tremor of the left hand, mild masking of the face, and decreased arm swing on the right.  Her memory score is stable, 29/30.  We discussed the addition of low-dose Sinemet, but decided to hold off for now, as she appears stable in her symptoms.  She will follow-up in 4 months or sooner if needed, she wishes to see Dr. Jannifer Franklin at next visit.   I spent 15 minutes with the patient. 50% of this time was spent discussing her plan of care.  Butler Denmark, AGNP-C, DNP 08/07/2019, 10:10 AM [facility] [address] [place], [place] [address] [phone]

## 2019-08-07 ENCOUNTER — Encounter: Payer: Self-pay | Admitting: Neurology

## 2019-08-07 ENCOUNTER — Ambulatory Visit (INDEPENDENT_AMBULATORY_CARE_PROVIDER_SITE_OTHER): Payer: Medicare Other | Admitting: Podiatry

## 2019-08-07 ENCOUNTER — Encounter: Payer: Self-pay | Admitting: Podiatry

## 2019-08-07 ENCOUNTER — Ambulatory Visit (INDEPENDENT_AMBULATORY_CARE_PROVIDER_SITE_OTHER): Payer: Medicare Other | Admitting: Neurology

## 2019-08-07 ENCOUNTER — Other Ambulatory Visit: Payer: Self-pay

## 2019-08-07 VITALS — BP 106/60 | HR 60 | Temp 96.9°F | Ht 64.0 in | Wt 125.4 lb

## 2019-08-07 DIAGNOSIS — G2111 Neuroleptic induced parkinsonism: Secondary | ICD-10-CM

## 2019-08-07 DIAGNOSIS — M79675 Pain in left toe(s): Secondary | ICD-10-CM | POA: Diagnosis not present

## 2019-08-07 DIAGNOSIS — M2010 Hallux valgus (acquired), unspecified foot: Secondary | ICD-10-CM

## 2019-08-07 DIAGNOSIS — M79674 Pain in right toe(s): Secondary | ICD-10-CM | POA: Diagnosis not present

## 2019-08-07 DIAGNOSIS — B351 Tinea unguium: Secondary | ICD-10-CM

## 2019-08-07 DIAGNOSIS — M2040 Other hammer toe(s) (acquired), unspecified foot: Secondary | ICD-10-CM

## 2019-08-07 NOTE — Patient Instructions (Signed)
It was great to meet you today!  I think things look stable on exam, memory score is good 29/30!  Continue on current medications, if you symptoms worsen, please call us, we can consider addition of Sinemet  Otherwise return in 4 months

## 2019-08-07 NOTE — Progress Notes (Signed)
Complaint:  Visit Type: Patient presents  to my office for  preventative foot care services. Complaint:  Patient says she has developed a painful skin lesion between her first and second toe left foot. This corn has been painful for 2 months.  Patient states" my nails have grown long and thick and become painful to walk and wear shoes" . The patient presents for preventative foot care services.   Podiatric Exam: Vascular: dorsalis pedis and posterior tibial pulses are palpable bilateral. Capillary return is immediate. Temperature gradient is WNL. Skin turgor WNL  Sensorium: Normal Semmes Weinstein monofilament test. Normal tactile sensation bilaterally. Nail Exam: Pt has thick disfigured discolored nails with subungual debris noted hallux nails  B/L. Ulcer Exam: There is no evidence of ulcer or pre-ulcerative changes or infection. Orthopedic Exam: Muscle tone and strength are WNL. No limitations in general ROM. No crepitus or effusions noted. Foot type and digits show no abnormalities. HAV 1st MPJ  B/L.  Hammer toes 2-5  B/l. Skin: No Porokeratosis. No infection or ulcers.  Diagnosis:  Onychomycosis, , Pain in right toe, pain in left toes,    Treatment & Plan Procedures and Treatment: Consent by patient was obtained for treatment procedures.   Debridement of mycotic and hypertrophic toenails, 1 through 5 bilateral and clearing of subungual debris. No ulceration, no infection noted.   Return Visit-Office Procedure: Patient instructed to return to the office for a follow up visit 3 months for continued evaluation and treatment.    Gardiner Barefoot DPM

## 2019-08-08 NOTE — Progress Notes (Signed)
I have read the note, and I agree with the clinical assessment and plan.  Rhandi Despain K Allianna Beaubien   

## 2019-08-16 ENCOUNTER — Ambulatory Visit (HOSPITAL_COMMUNITY): Payer: Medicare Other | Admitting: Psychiatry

## 2019-08-17 ENCOUNTER — Other Ambulatory Visit: Payer: Self-pay

## 2019-08-17 ENCOUNTER — Ambulatory Visit (INDEPENDENT_AMBULATORY_CARE_PROVIDER_SITE_OTHER): Payer: Medicare Other | Admitting: Psychiatry

## 2019-08-17 DIAGNOSIS — F325 Major depressive disorder, single episode, in full remission: Secondary | ICD-10-CM

## 2019-08-17 MED ORDER — ARIPIPRAZOLE 10 MG PO TABS: ORAL_TABLET | ORAL | 2 refills | Status: DC

## 2019-08-17 MED ORDER — CITALOPRAM HYDROBROMIDE 20 MG PO TABS: 20.0000 mg | ORAL_TABLET | Freq: Every day | ORAL | 2 refills | Status: DC

## 2019-08-17 NOTE — Progress Notes (Signed)
Patient ID: Sylvia Maldonado, female   DOB: [birth year], 79 y.o.   MRN: [id number] [facility] MD Progress Note  08/17/2019 10:36 AM Sylvia Maldonado  MRN:  [id number] Subjective: I am feel awful; I feel depressed. Principal Problem: Major depression in remission Diagnosis: Major depression recurrent   Today the patient is doing well. To be very clear she seems to have a resting tremor and some pill rolling movements in her left hand. 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 Is not tardive dyskinesia. Patient actually doesn't even notice it. She says he really doesn't bother her. He just other people tell her that she's having a mild tremor. Again he doesn't get in the way of her functioning in all and she much rather continue her medicines as is and not change anything. He doesn't want any new medicines for example Cogentin. At this time and is not getting her way and reasonably certain that it is not tardive dyskinesia. His been exactly this way and has not changed. It affects her part of her body her left. The patient denies daily depression. She sleeping and eating well. She's got good energy. There is no evidence of psychosis. Is happy with life. 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 Patient walks regularly. She's actually lost 10 pounds which he attributes to control diet and walking.  Past Surgical History:  Procedure Laterality Date  . cataracts surg    . COLONOSCOPY    . EYE SURGERY    . PELVIC FLOOR REPAIR    . TONSILLECTOMY    . VAGINAL HYSTERECTOMY     Family History:  Family History  Problem Relation Age of Onset  . CAD Neg Hx    Family Psychiatric  History:  Social History:  Social History   Substance and Sexual Activity  Alcohol Use No     Social History   Substance and Sexual Activity  Drug Use No    Social History   Socioeconomic History  . Marital status: Single   Spouse name: Not on file  . Number of children: Not on file  . Years of education: Not on file  . Highest education level: Not on file  Occupational History  . Not on file  Tobacco Use  . Smoking status: Never Smoker  . Smokeless tobacco: Never Used  Substance and Sexual Activity  . Alcohol use: No  . Drug use: No  . Sexual activity: Not Currently  Other Topics Concern  . Not on file  Social History Narrative   Right handed    Lives alone at home    No caffeine usage    Social Determinants of Health   Financial Resource Strain:   . Difficulty of Paying Living Expenses: Not on file  Food Insecurity:   . Worried About Charity fundraiser in the Last Year: Not on file  . Ran Out of Food in the Last Year: Not on file  Transportation Needs:   . Lack of Transportation (Medical): Not on file  . Lack of Transportation (Non-Medical): Not on file  Physical Activity:   . Days of Exercise per Week: Not on file  . Minutes of Exercise per Session: Not on file  Stress:   . Feeling of Stress : Not on file  Social Connections:   . Frequency of Communication with Friends and Family: Not on file  . Frequency of Social Gatherings with Friends  and Family: Not on file  . Attends Religious Services: Not on file  . Active Member of Clubs or Organizations: Not on file  . Attends Archivist Meetings: Not on file  . Marital Status: Not on file   Additional Social History:                         Sleep: Good  Appetite:  Good  Current Medications: Current Outpatient Medications  Medication Sig Dispense Refill  . ARIPiprazole (ABILIFY) 10 MG tablet 1  qam 90 tablet 2  . aspirin 81 MG tablet Take 81 mg by mouth every other day.     . Cholecalciferol (VITAMIN D-3) 1000 UNITS CAPS Take 2,000 Units by mouth daily.    . citalopram (CELEXA) 20 MG tablet Take 1 tablet (20 mg total) by mouth daily. 90 tablet 2  . cycloSPORINE (RESTASIS) 0.05 % ophthalmic emulsion Place 1 drop  into both eyes daily.     Marland Kitchen etodolac (LODINE) 500 MG tablet etodolac 500 mg tablet    . lidocaine (XYLOCAINE) 2 % solution     . mupirocin ointment (BACTROBAN) 2 % Apply to facial cut twice daily x a week    . traMADol (ULTRAM) 50 MG tablet TK 1 T PO Q 8 TO 12 H PRN    . UNABLE TO FIND RINSE AND SPIT WITH 1 TABLESPOON FOUR TIMES DAILY.    Marland Kitchen UNABLE TO FIND RINSE AND SPIT 5 ML BY MOUTH FOUR TIMES DAILY    . vitamin B-12 (CYANOCOBALAMIN) 1000 MCG tablet Take 1,000 mcg by mouth daily.     No current facility-administered medications for this visit.    Lab Results: No results found for this or any previous visit (from the past 48 hour(s)).  Physical Findings: AIMS:  , ,  ,  ,    CIWA:    COWS:     Musculoskeletal: Strength & Muscle Tone: within normal limits Gait & Station: normal Patient leans: Right  Psychiatric Specialty Exam: ROS  There were no vitals taken for this visit.There is no height or weight on file to calculate BMI.  General Appearance: Casual  Eye Contact::  Good  Speech:  Clear and Coherent  Volume:  Normal  Mood:  NA  Affect:  Congruent  Thought Process:  Coherent  Orientation:  Full (Time, Place, and Person)  Thought Content:  WDL  Suicidal Thoughts:  No  Homicidal Thoughts:  No  Memory:  NA  Judgement:  NA  Insight:  Good  Psychomotor Activity:  Normal  Concentration:  Good  Recall:  Good  Fund of Knowledge:Good  Language: Good  Akathisia:  No  Handed:  Right  AIMS (if indicated):     Assets:  Desire for Improvement  ADL's:  Intact  Cognition: WNL  Sleep:       [name] 08/17/2019, 10:36 AM  Since problems Maj. Depression. She's doing very well taking Celexa and Abilify as prescribed. She takes 10 mg of Abilify. Efforts to reduce the Abilify have led to a clear decline. We are both under the understanding that she'll continue taking it for a number of years. I will monitor very closer tardive dyskinesia which I do not see. I see the  possibility of a mild EPS Resting tremor. At this time the patient is resistant to taking Cogentin.The patient denies use in our or drugs. She is no psychotic symptoms at all. She was seen again in 3  months.

## 2019-08-22 ENCOUNTER — Ambulatory Visit: Payer: Medicare Other | Admitting: Neurology

## 2019-09-05 DIAGNOSIS — Z961 Presence of intraocular lens: Secondary | ICD-10-CM | POA: Diagnosis not present

## 2019-09-05 DIAGNOSIS — H5211 Myopia, right eye: Secondary | ICD-10-CM | POA: Diagnosis not present

## 2019-09-06 DIAGNOSIS — M8588 Other specified disorders of bone density and structure, other site: Secondary | ICD-10-CM | POA: Diagnosis not present

## 2019-09-06 DIAGNOSIS — Z0000 Encounter for general adult medical examination without abnormal findings: Secondary | ICD-10-CM | POA: Diagnosis not present

## 2019-09-06 DIAGNOSIS — F339 Major depressive disorder, recurrent, unspecified: Secondary | ICD-10-CM | POA: Diagnosis not present

## 2019-09-06 DIAGNOSIS — G2111 Neuroleptic induced parkinsonism: Secondary | ICD-10-CM | POA: Diagnosis not present

## 2019-09-06 DIAGNOSIS — R7989 Other specified abnormal findings of blood chemistry: Secondary | ICD-10-CM | POA: Diagnosis not present

## 2019-09-06 DIAGNOSIS — E7800 Pure hypercholesterolemia, unspecified: Secondary | ICD-10-CM | POA: Diagnosis not present

## 2019-09-06 DIAGNOSIS — E559 Vitamin D deficiency, unspecified: Secondary | ICD-10-CM | POA: Diagnosis not present

## 2019-09-06 DIAGNOSIS — Z85820 Personal history of malignant melanoma of skin: Secondary | ICD-10-CM | POA: Diagnosis not present

## 2019-09-10 ENCOUNTER — Other Ambulatory Visit: Payer: Self-pay | Admitting: Family Medicine

## 2019-09-10 DIAGNOSIS — M8580 Other specified disorders of bone density and structure, unspecified site: Secondary | ICD-10-CM

## 2019-09-14 DIAGNOSIS — R197 Diarrhea, unspecified: Secondary | ICD-10-CM | POA: Diagnosis not present

## 2019-09-20 ENCOUNTER — Ambulatory Visit (INDEPENDENT_AMBULATORY_CARE_PROVIDER_SITE_OTHER): Payer: Medicare Other | Admitting: Psychology

## 2019-09-20 DIAGNOSIS — F331 Major depressive disorder, recurrent, moderate: Secondary | ICD-10-CM

## 2019-10-04 ENCOUNTER — Ambulatory Visit (INDEPENDENT_AMBULATORY_CARE_PROVIDER_SITE_OTHER): Payer: Medicare Other | Admitting: Psychology

## 2019-10-04 DIAGNOSIS — F331 Major depressive disorder, recurrent, moderate: Secondary | ICD-10-CM | POA: Diagnosis not present

## 2019-10-18 ENCOUNTER — Ambulatory Visit (INDEPENDENT_AMBULATORY_CARE_PROVIDER_SITE_OTHER): Payer: Medicare Other | Admitting: Psychology

## 2019-10-18 DIAGNOSIS — F331 Major depressive disorder, recurrent, moderate: Secondary | ICD-10-CM

## 2019-11-01 ENCOUNTER — Ambulatory Visit (INDEPENDENT_AMBULATORY_CARE_PROVIDER_SITE_OTHER): Payer: Medicare Other | Admitting: Psychology

## 2019-11-01 DIAGNOSIS — F331 Major depressive disorder, recurrent, moderate: Secondary | ICD-10-CM

## 2019-11-06 ENCOUNTER — Ambulatory Visit (INDEPENDENT_AMBULATORY_CARE_PROVIDER_SITE_OTHER): Payer: Medicare Other | Admitting: Podiatry

## 2019-11-06 ENCOUNTER — Other Ambulatory Visit: Payer: Self-pay

## 2019-11-06 ENCOUNTER — Encounter: Payer: Self-pay | Admitting: Podiatry

## 2019-11-06 VITALS — Temp 98.9°F

## 2019-11-06 DIAGNOSIS — M2040 Other hammer toe(s) (acquired), unspecified foot: Secondary | ICD-10-CM

## 2019-11-06 DIAGNOSIS — M2010 Hallux valgus (acquired), unspecified foot: Secondary | ICD-10-CM

## 2019-11-06 DIAGNOSIS — M79675 Pain in left toe(s): Secondary | ICD-10-CM

## 2019-11-06 DIAGNOSIS — M79674 Pain in right toe(s): Secondary | ICD-10-CM

## 2019-11-06 DIAGNOSIS — B351 Tinea unguium: Secondary | ICD-10-CM | POA: Diagnosis not present

## 2019-11-06 NOTE — Progress Notes (Signed)
This patient returns to the office for evaluation and treatment of long thick painful nails .  This patient is unable to trim his own nails since the patient cannot reach the feet.  Patient says the nails are painful walking and wearing his shoes.  He returns for preventive foot care services.  General Appearance  Alert, conversant and in no acute stress.  Vascular  Dorsalis pedis and posterior tibial  pulses are palpable  bilaterally.  Capillary return is within normal limits  bilaterally. Temperature is within normal limits  bilaterally.  Neurologic  Senn-Weinstein monofilament wire test within normal limits  bilaterally. Muscle power within normal limits bilaterally.  Nails Thick disfigured discolored nails with subungual debris  from hallux to fifth toes bilaterally. No evidence of bacterial infection or drainage bilaterally.  Orthopedic  No limitations of motion  feet .  No crepitus or effusions noted.   HAV 1st MPJ  B/L.  Hammer toes 2-5  B/L.  Skin  normotropic skin with no porokeratosis noted bilaterally.  No signs of infections or ulcers noted.     Onychomycosis  Pain in toes right foot  Pain in toes left foot  Debridement  of nails  1-5  B/L with a nail nipper.  Nails were then filed using a dremel tool with no incidents.    RTC 3 months    Revanth Neidig DPM  

## 2019-11-15 ENCOUNTER — Ambulatory Visit (INDEPENDENT_AMBULATORY_CARE_PROVIDER_SITE_OTHER): Payer: Medicare Other | Admitting: Psychology

## 2019-11-15 DIAGNOSIS — F331 Major depressive disorder, recurrent, moderate: Secondary | ICD-10-CM

## 2019-11-28 ENCOUNTER — Telehealth (INDEPENDENT_AMBULATORY_CARE_PROVIDER_SITE_OTHER): Payer: Medicare Other | Admitting: Psychiatry

## 2019-11-28 ENCOUNTER — Other Ambulatory Visit: Payer: Self-pay

## 2019-11-28 DIAGNOSIS — F325 Major depressive disorder, single episode, in full remission: Secondary | ICD-10-CM | POA: Diagnosis not present

## 2019-11-28 MED ORDER — CITALOPRAM HYDROBROMIDE 20 MG PO TABS: 20.0000 mg | ORAL_TABLET | Freq: Every day | ORAL | 2 refills | Status: DC

## 2019-11-28 MED ORDER — ARIPIPRAZOLE 10 MG PO TABS: ORAL_TABLET | ORAL | 2 refills | Status: DC

## 2019-11-28 NOTE — Progress Notes (Signed)
Patient ID: Sylvia Maldonado, female   DOB: 24-Dec-1940, 79 y.o.   MRN: [id number] [facility] MD Progress Note  11/28/2019 3:50 PM Sylvia Maldonado  MRN:  [id number] Subjective: I am feel awful; I feel depressed. Principal Problem: Major depression in remission Diagnosis: Major depression recurrent   Today the patient is doing very well.  She is stable.  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  Patient is still working about 7 hours substituting for other people.  She works for [employer].  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  Patient is active and friendly.  She is engaging.  Her family is doing very well.  Her health is good.  Financially she is very stable.  She takes her medicines just as prescribed.  She has a minor tremor in her left hand which neurologist say is most likely pseudoparkinsonian in nature.  The patient's mood is great.  She is not catatonic.  She is just starting to read again.  She does not have a TV.  Reading is her way of enjoying her life.  She also enjoys time with her kids.  The patient will work more hours if she is given the opportunity.  Hopefully she will be given the opportunity.  Her energy level is good.  She has no fatigue.  She walks 3 miles a day.  She takes her medicines just as prescribed.  Generally she is resistant to any changes as she wants never to return back to depression.  Efforts in the past to reduce her Abilify have led to a decline.  Past Surgical History:  Procedure Laterality Date  . cataracts surg    . COLONOSCOPY    . EYE SURGERY    . PELVIC FLOOR REPAIR    . TONSILLECTOMY    . VAGINAL HYSTERECTOMY     Family History:  Family History  Problem Relation Age of Onset  . CAD Neg Hx    Family Psychiatric  History:  Social History:  Social History   Substance and Sexual Activity  Alcohol Use No     Social History   Substance and Sexual Activity  Drug Use No    Social History    Socioeconomic History  . Marital status: Single    Spouse name: Not on file  . Number of children: Not on file  . Years of education: Not on file  . Highest education level: Not on file  Occupational History  . Not on file  Tobacco Use  . Smoking status: Never Smoker  . Smokeless tobacco: Never Used  Substance and Sexual Activity  . Alcohol use: No  . Drug use: No  . Sexual activity: Not Currently  Other Topics Concern  . Not on file  Social History Narrative   Right handed    Lives alone at home    No caffeine usage    Social Determinants of Health   Financial Resource Strain:   . Difficulty of Paying Living Expenses:   Food Insecurity:   . Worried About Charity fundraiser in the Last Year:   . Arboriculturist in the Last Year:   Transportation Needs:   . Film/video editor (Medical):   Marland Kitchen Lack of Transportation (Non-Medical):   Physical Activity:   . Days of Exercise per Week:   . Minutes of Exercise per Session:   Stress:   . Feeling  of Stress :   Social Connections:   . Frequency of Communication with Friends and Family:   . Frequency of Social Gatherings with Friends and Family:   . Attends Religious Services:   . Active Member of Clubs or Organizations:   . Attends Archivist Meetings:   Marland Kitchen Marital Status:    Additional Social History:                         Sleep: Good  Appetite:  Good  Current Medications: Current Outpatient Medications  Medication Sig Dispense Refill  . ARIPiprazole (ABILIFY) 10 MG tablet 1  qam 90 tablet 2  . aspirin 81 MG tablet Take 81 mg by mouth every other day.     . Cholecalciferol (VITAMIN D-3) 1000 UNITS CAPS Take 2,000 Units by mouth daily.    . citalopram (CELEXA) 20 MG tablet Take 1 tablet (20 mg total) by mouth daily. 90 tablet 2  . cycloSPORINE (RESTASIS) 0.05 % ophthalmic emulsion Place 1 drop into both eyes daily.     Marland Kitchen etodolac (LODINE) 500 MG tablet etodolac 500 mg tablet    .  lidocaine (XYLOCAINE) 2 % solution     . mupirocin ointment (BACTROBAN) 2 % Apply to facial cut twice daily x a week    . traMADol (ULTRAM) 50 MG tablet TK 1 T PO Q 8 TO 12 H PRN    . UNABLE TO FIND RINSE AND SPIT WITH 1 TABLESPOON FOUR TIMES DAILY.    Marland Kitchen UNABLE TO FIND RINSE AND SPIT 5 ML BY MOUTH FOUR TIMES DAILY    . vitamin B-12 (CYANOCOBALAMIN) 1000 MCG tablet Take 1,000 mcg by mouth daily.     No current facility-administered medications for this visit.    Lab Results: No results found for this or any previous visit (from the past 48 hour(s)).  Physical Findings: AIMS:  , ,  ,  ,    CIWA:    COWS:     Musculoskeletal: Strength & Muscle Tone: within normal limits Gait & Station: normal Patient leans: Right  Psychiatric Specialty Exam: ROS  There were no vitals taken for this visit.There is no height or weight on file to calculate BMI.  General Appearance: Casual  Eye Contact::  Good  Speech:  Clear and Coherent  Volume:  Normal  Mood:  NA  Affect:  Congruent  Thought Process:  Coherent  Orientation:  Full (Time, Place, and Person)  Thought Content:  WDL  Suicidal Thoughts:  No  Homicidal Thoughts:  No  Memory:  NA  Judgement:  NA  Insight:  Good  Psychomotor Activity:  Normal  Concentration:  Good  Recall:  Good  Fund of Knowledge:Good  Language: Good  Akathisia:  No  Handed:  Right  AIMS (if indicated):     Assets:  Desire for Improvement  ADL's:  Intact  Cognition: WNL  Sleep:       [name] 11/28/2019, 3:50 PM  This patient is 1 and only problem is that of major depression in remission.  At this time she takes 10 mg of Abilify and 20 mg of Celexa.  Patient is doing very well.  She has no vegetative symptoms.  She is functioning extremely well.  Now the sun is coming out she is enjoying things even more.  She says Sunshine  makes all the difference.

## 2019-11-29 ENCOUNTER — Ambulatory Visit (INDEPENDENT_AMBULATORY_CARE_PROVIDER_SITE_OTHER): Payer: Medicare Other | Admitting: Psychology

## 2019-11-29 DIAGNOSIS — F331 Major depressive disorder, recurrent, moderate: Secondary | ICD-10-CM

## 2019-12-05 ENCOUNTER — Ambulatory Visit (INDEPENDENT_AMBULATORY_CARE_PROVIDER_SITE_OTHER): Payer: Medicare Other | Admitting: Neurology

## 2019-12-05 ENCOUNTER — Encounter: Payer: Self-pay | Admitting: Neurology

## 2019-12-05 ENCOUNTER — Other Ambulatory Visit: Payer: Self-pay

## 2019-12-05 VITALS — BP 129/62 | HR 56 | Ht 64.0 in | Wt 125.5 lb

## 2019-12-05 DIAGNOSIS — G2111 Neuroleptic induced parkinsonism: Secondary | ICD-10-CM | POA: Diagnosis not present

## 2019-12-05 NOTE — Progress Notes (Signed)
Reason for visit: Secondary parkinsonism  Sylvia Maldonado is an 79 y.o. female  History of present illness:  Sylvia Maldonado is a 79 year old right-handed white female with a history of depression on Abilify, and some issues with secondary parkinsonism.  The patient has a mild tremor involving the left upper extremity, she has mild masking of the face.  She remains quite active, she will walk about 12 miles a week.  She has mild memory disturbance, this has been relatively stable over the last year to 18 months.  The patient has not given up any activities day living because of memory.  She does not believe there has been any significant changes in her physical abilities since last seen.  She claims that her depression has strong seasonal variations, shorter days of the winter bring on severe depression.  Past Medical History:  Diagnosis Date  . Cataract   . Depression   . Diverticulosis   . Dry eyes   . Hx of adenomatous colonic polyps   . Internal hemorrhoids   . Parkinsonism ([place]) 07/19/2018   On Abilify    Past Surgical History:  Procedure Laterality Date  . cataracts surg    . COLONOSCOPY    . EYE SURGERY    . PELVIC FLOOR REPAIR    . TONSILLECTOMY    . VAGINAL HYSTERECTOMY      Family History  Problem Relation Age of Onset  . CAD Neg Hx     Social history:  reports that she has never smoked. She has never used smokeless tobacco. She reports that she does not drink alcohol or use drugs.    Allergies  Allergen Reactions  . Doxycycline Rash  . Neosporin [Neomycin-Bacitracin Zn-Polymyx] Swelling and Rash    Swelling and rash  . Penicillins Swelling and Rash    swelling and rash  . Sulfa Antibiotics Swelling and Rash  . Latex Rash    rash  . Prednisone Anxiety  . Acetaminophen     Medications:  Prior to Admission medications   Medication Sig Start Date End Date Taking? Authorizing Provider  ARIPiprazole (ABILIFY) 10 MG tablet 1  qam 11/28/19  Yes Plovsky,  Berneta Sages, MD  aspirin 81 MG tablet Take 81 mg by mouth every other day.    Yes [provider]  Cholecalciferol (VITAMIN D-3) 1000 UNITS CAPS Take 2,000 Units by mouth daily.   Yes [provider]  citalopram (CELEXA) 20 MG tablet Take 1 tablet (20 mg total) by mouth daily. 11/28/19  Yes Plovsky, Berneta Sages, MD  cycloSPORINE (RESTASIS) 0.05 % ophthalmic emulsion Place 1 drop into both eyes daily.    Yes [provider]  traMADol (ULTRAM) 50 MG tablet TK 1 T PO Q 8 TO 12 H PRN 01/25/19  Yes [provider]  vitamin B-12 (CYANOCOBALAMIN) 1000 MCG tablet Take 1,000 mcg by mouth daily.   Yes [provider]    ROS:  Out of a complete 14 system review of symptoms, the patient complains only of the following symptoms, and all other reviewed systems are negative.  Tremor Depression  Blood pressure 129/62, pulse (!) 56, height 5\' 4"  (1.626 m), weight 125 lb 8 oz (56.9 kg).  Physical Exam  General: The patient is alert and cooperative at the time of the examination.  Skin: No significant peripheral edema is noted.   Neurologic Exam  Mental status: The patient is alert and oriented x 3 at the time of the examination. The patient has apparent  normal recent and remote memory, with an apparently normal attention span and concentration ability.   Cranial nerves: Facial symmetry is present. Speech is normal, no aphasia or dysarthria is noted. Extraocular movements are full. Visual fields are full.  Mild masking the face is seen.  Motor: The patient has good strength in all 4 extremities.  Sensory examination: Soft touch sensation is symmetric on the face, arms, and legs.  Coordination: The patient has good finger-nose-finger and heel-to-shin bilaterally.  Mild intermittent resting tremors are seen with the left upper extremity.  Gait and station: The patient has a normal gait, the patient has symmetric arm swing.  She is able to arise from a seated position  with arms crossed. Tandem gait is normal. Romberg is negative. No drift is seen.  Reflexes: Deep tendon reflexes are symmetric.   Assessment/Plan:  1.  Secondary parkinsonism  2.  Depression  3.  Memory disturbance  The patient has not had significant changes in her physical abilities, we will not add medications for Parkinson's disease at this point.  We will follow the memory issues over time, she is not on any medication for memory.  She has been relatively stable.  She will follow-up here in about 6 or 7 months.  Jill Alexanders MD 12/05/2019 9:36 AM  [facility] [address] [place], [place] [address]  Phone [phone] Fax [phone]

## 2019-12-13 ENCOUNTER — Ambulatory Visit: Payer: Medicare Other | Admitting: Psychology

## 2019-12-27 ENCOUNTER — Ambulatory Visit (INDEPENDENT_AMBULATORY_CARE_PROVIDER_SITE_OTHER): Payer: Medicare Other | Admitting: Psychology

## 2019-12-27 DIAGNOSIS — F331 Major depressive disorder, recurrent, moderate: Secondary | ICD-10-CM | POA: Diagnosis not present

## 2020-01-10 ENCOUNTER — Ambulatory Visit (INDEPENDENT_AMBULATORY_CARE_PROVIDER_SITE_OTHER): Payer: Medicare Other | Admitting: Psychology

## 2020-01-10 DIAGNOSIS — F331 Major depressive disorder, recurrent, moderate: Secondary | ICD-10-CM | POA: Diagnosis not present

## 2020-01-24 ENCOUNTER — Ambulatory Visit (INDEPENDENT_AMBULATORY_CARE_PROVIDER_SITE_OTHER): Payer: Medicare Other | Admitting: Psychology

## 2020-01-24 DIAGNOSIS — F331 Major depressive disorder, recurrent, moderate: Secondary | ICD-10-CM

## 2020-02-05 ENCOUNTER — Ambulatory Visit (INDEPENDENT_AMBULATORY_CARE_PROVIDER_SITE_OTHER): Payer: Medicare Other | Admitting: Podiatry

## 2020-02-05 ENCOUNTER — Encounter: Payer: Self-pay | Admitting: Podiatry

## 2020-02-05 ENCOUNTER — Other Ambulatory Visit: Payer: Self-pay

## 2020-02-05 DIAGNOSIS — M79675 Pain in left toe(s): Secondary | ICD-10-CM

## 2020-02-05 DIAGNOSIS — M2040 Other hammer toe(s) (acquired), unspecified foot: Secondary | ICD-10-CM

## 2020-02-05 DIAGNOSIS — M2010 Hallux valgus (acquired), unspecified foot: Secondary | ICD-10-CM

## 2020-02-05 DIAGNOSIS — B351 Tinea unguium: Secondary | ICD-10-CM | POA: Diagnosis not present

## 2020-02-05 DIAGNOSIS — M79674 Pain in right toe(s): Secondary | ICD-10-CM

## 2020-02-05 NOTE — Progress Notes (Signed)
This patient returns to the office for evaluation and treatment of long thick painful nails .  This patient is unable to trim his own nails since the patient cannot reach the feet.  Patient says the nails are painful walking and wearing his shoes.  He returns for preventive foot care services.  General Appearance  Alert, conversant and in no acute stress.  Vascular  Dorsalis pedis and posterior tibial  pulses are palpable  bilaterally.  Capillary return is within normal limits  bilaterally. Temperature is within normal limits  bilaterally.  Neurologic  Senn-Weinstein monofilament wire test within normal limits  bilaterally. Muscle power within normal limits bilaterally.  Nails Thick disfigured discolored nails with subungual debris  from hallux to fifth toes bilaterally. No evidence of bacterial infection or drainage bilaterally.  Orthopedic  No limitations of motion  feet .  No crepitus or effusions noted.   HAV 1st MPJ  B/L.  Hammer toes 2-5  B/L.  Skin  normotropic skin with no porokeratosis noted bilaterally.  No signs of infections or ulcers noted.     Onychomycosis  Pain in toes right foot  Pain in toes left foot  Debridement  of nails  1-5  B/L with a nail nipper.  Nails were then filed using a dremel tool with no incidents.    RTC 3 months    Gardiner Barefoot DPM

## 2020-02-12 DIAGNOSIS — L409 Psoriasis, unspecified: Secondary | ICD-10-CM | POA: Diagnosis not present

## 2020-02-12 DIAGNOSIS — L820 Inflamed seborrheic keratosis: Secondary | ICD-10-CM | POA: Diagnosis not present

## 2020-02-12 DIAGNOSIS — L821 Other seborrheic keratosis: Secondary | ICD-10-CM | POA: Diagnosis not present

## 2020-02-12 DIAGNOSIS — D485 Neoplasm of uncertain behavior of skin: Secondary | ICD-10-CM | POA: Diagnosis not present

## 2020-02-21 ENCOUNTER — Ambulatory Visit (INDEPENDENT_AMBULATORY_CARE_PROVIDER_SITE_OTHER): Payer: Medicare Other | Admitting: Psychology

## 2020-02-21 DIAGNOSIS — F331 Major depressive disorder, recurrent, moderate: Secondary | ICD-10-CM | POA: Diagnosis not present

## 2020-03-06 ENCOUNTER — Ambulatory Visit (INDEPENDENT_AMBULATORY_CARE_PROVIDER_SITE_OTHER): Payer: Medicare Other | Admitting: Psychology

## 2020-03-06 DIAGNOSIS — F339 Major depressive disorder, recurrent, unspecified: Secondary | ICD-10-CM | POA: Diagnosis not present

## 2020-03-06 DIAGNOSIS — F331 Major depressive disorder, recurrent, moderate: Secondary | ICD-10-CM

## 2020-03-06 DIAGNOSIS — R7989 Other specified abnormal findings of blood chemistry: Secondary | ICD-10-CM | POA: Diagnosis not present

## 2020-03-06 DIAGNOSIS — E7800 Pure hypercholesterolemia, unspecified: Secondary | ICD-10-CM | POA: Diagnosis not present

## 2020-03-06 DIAGNOSIS — E538 Deficiency of other specified B group vitamins: Secondary | ICD-10-CM | POA: Diagnosis not present

## 2020-03-06 DIAGNOSIS — R899 Unspecified abnormal finding in specimens from other organs, systems and tissues: Secondary | ICD-10-CM | POA: Diagnosis not present

## 2020-03-06 DIAGNOSIS — G2111 Neuroleptic induced parkinsonism: Secondary | ICD-10-CM | POA: Diagnosis not present

## 2020-03-06 DIAGNOSIS — E559 Vitamin D deficiency, unspecified: Secondary | ICD-10-CM | POA: Diagnosis not present

## 2020-03-20 ENCOUNTER — Ambulatory Visit (INDEPENDENT_AMBULATORY_CARE_PROVIDER_SITE_OTHER): Payer: Medicare Other | Admitting: Psychology

## 2020-03-20 DIAGNOSIS — F331 Major depressive disorder, recurrent, moderate: Secondary | ICD-10-CM

## 2020-04-03 ENCOUNTER — Telehealth (INDEPENDENT_AMBULATORY_CARE_PROVIDER_SITE_OTHER): Payer: Medicare Other | Admitting: Psychiatry

## 2020-04-03 ENCOUNTER — Other Ambulatory Visit: Payer: Self-pay

## 2020-04-03 DIAGNOSIS — F325 Major depressive disorder, single episode, in full remission: Secondary | ICD-10-CM | POA: Diagnosis not present

## 2020-04-03 MED ORDER — CITALOPRAM HYDROBROMIDE 20 MG PO TABS: 20.0000 mg | ORAL_TABLET | Freq: Every day | ORAL | 2 refills | Status: DC

## 2020-04-03 MED ORDER — ARIPIPRAZOLE 10 MG PO TABS: ORAL_TABLET | ORAL | 2 refills | Status: DC

## 2020-04-03 NOTE — Progress Notes (Signed)
Patient ID: Sylvia Maldonado, female   DOB: 10-13-1940, 79 y.o.   MRN: [id number] [facility] MD Progress Note  04/03/2020 1:28 PM Sylvia Maldonado  MRN:  [id number] Subjective: I am feel awful; I feel depressed. Principal Problem: Major depression in remission Diagnosis: Major depression recurrent  Today the patient is doing very well.  She is at her baseline.  She does continue to work and she works on the weekend about 20 hours.  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  The patient is positive and optimistic.  The weather is good for her.  She loves to walk.  Physically she is very healthy.  She has a new primary care doctor.  The patient denies any shortness of breath or chest pain or any neurological complaints.  Financially she is stable.  Patient has a good relationship with her family.  The patient is sleeping and eating well with good energy and good abilities to think and concentrate.  She is not psychotic.  She is not suicidal.  She drinks no alcohol uses no drugs.  She is very stable.  Patient is well aware of the potential of tardive dyskinesia taking Abilify.  Nonetheless over the last year or 2 it is very clear to her that this dose works well and she has had no signs or symptoms of tardive dyskinesia or any other side effects.  She continues taking Celexa 20 mg.  She is positive and optimistic that she functions extremely well    Past Surgical History:  Procedure Laterality Date  . cataracts surg    . COLONOSCOPY    . EYE SURGERY    . PELVIC FLOOR REPAIR    . TONSILLECTOMY    . VAGINAL HYSTERECTOMY     Family History:  Family History  Problem Relation Age of Onset  . CAD Neg Hx    Family Psychiatric  History:  Social History:  Social History   Substance and Sexual Activity  Alcohol Use No     Social History   Substance and Sexual Activity  Drug Use No    Social History   Socioeconomic History  . Marital status: Single    Spouse name: Not on file  . Number of children:  Not on file  . Years of education: Not on file  . Highest education level: Not on file  Occupational History  . Not on file  Tobacco Use  . Smoking status: Never Smoker  . Smokeless tobacco: Never Used  Vaping Use  . Vaping Use: Never used  Substance and Sexual Activity  . Alcohol use: No  . Drug use: No  . Sexual activity: Not Currently  Other Topics Concern  . Not on file  Social History Narrative   Right handed    Lives alone at home    No caffeine usage    Social Determinants of Health   Financial Resource Strain:   . Difficulty of Paying Living Expenses: Not on file  Food Insecurity:   . Worried About Charity fundraiser in the Last Year: Not on file  . Ran Out of Food in the Last Year: Not on file  Transportation Needs:   . Lack of Transportation (Medical): Not on file  . Lack of Transportation (Non-Medical): Not on file  Physical Activity:   . Days of Exercise per Week: Not on file  . Minutes of Exercise per Session: Not on file  Stress:   . Feeling of Stress : Not on  file  Social Connections:   . Frequency of Communication with Friends and Family: Not on file  . Frequency of Social Gatherings with Friends and Family: Not on file  . Attends Religious Services: Not on file  . Active Member of Clubs or Organizations: Not on file  . Attends Archivist Meetings: Not on file  . Marital Status: Not on file   Additional Social History:                         Sleep: Good  Appetite:  Good  Current Medications: Current Outpatient Medications  Medication Sig Dispense Refill  . ARIPiprazole (ABILIFY) 10 MG tablet 1  qam 90 tablet 2  . aspirin 81 MG tablet Take 81 mg by mouth every other day.     . Cholecalciferol (VITAMIN D-3) 1000 UNITS CAPS Take 2,000 Units by mouth daily.    . citalopram (CELEXA) 20 MG tablet Take 1 tablet (20 mg total) by mouth daily. 90 tablet 2  . cycloSPORINE (RESTASIS) 0.05 % ophthalmic emulsion Place 1 drop into both  eyes daily.     . traMADol (ULTRAM) 50 MG tablet TK 1 T PO Q 8 TO 12 H PRN    . vitamin B-12 (CYANOCOBALAMIN) 1000 MCG tablet Take 1,000 mcg by mouth daily.     No current facility-administered medications for this visit.    Lab Results: No results found for this or any previous visit (from the past 48 hour(s)).  Physical Findings: AIMS:  , ,  ,  ,    CIWA:    COWS:     Musculoskeletal: Strength & Muscle Tone: within normal limits Gait & Station: normal Patient leans: Right  Psychiatric Specialty Exam: ROS  There were no vitals taken for this visit.There is no height or weight on file to calculate BMI.  General Appearance: Casual  Eye Contact::  Good  Speech:  Clear and Coherent  Volume:  Normal  Mood:  NA  Affect:  Congruent  Thought Process:  Coherent  Orientation:  Full (Time, Place, and Person)  Thought Content:  WDL  Suicidal Thoughts:  No  Homicidal Thoughts:  No  Memory:  NA  Judgement:  NA  Insight:  Good  Psychomotor Activity:  Normal  Concentration:  Good  Recall:  Good  Fund of Knowledge:Good  Language: Good  Akathisia:  No  Handed:  Right  AIMS (if indicated):     Assets:  Desire for Improvement  ADL's:  Intact  Cognition: WNL  Sleep:       [name] 04/03/2020, 1:28 PM  This patient's first and only problem is that of major depression.  The patient takes Celexa 20 mg and Abilify 10 mg.  Patient is doing very well.  She has no vegetative symptoms.  Presently she is not in therapy.  This patient will return to see me in approximately 5 months.  She is very stable.

## 2020-04-17 ENCOUNTER — Ambulatory Visit (INDEPENDENT_AMBULATORY_CARE_PROVIDER_SITE_OTHER): Payer: Medicare Other | Admitting: Psychology

## 2020-04-17 DIAGNOSIS — F331 Major depressive disorder, recurrent, moderate: Secondary | ICD-10-CM | POA: Diagnosis not present

## 2020-05-01 ENCOUNTER — Ambulatory Visit (INDEPENDENT_AMBULATORY_CARE_PROVIDER_SITE_OTHER): Payer: Medicare Other | Admitting: Psychology

## 2020-05-01 DIAGNOSIS — F331 Major depressive disorder, recurrent, moderate: Secondary | ICD-10-CM

## 2020-05-07 ENCOUNTER — Other Ambulatory Visit: Payer: Self-pay

## 2020-05-07 ENCOUNTER — Encounter: Payer: Self-pay | Admitting: Podiatry

## 2020-05-07 ENCOUNTER — Ambulatory Visit (INDEPENDENT_AMBULATORY_CARE_PROVIDER_SITE_OTHER): Payer: Medicare Other | Admitting: Podiatry

## 2020-05-07 DIAGNOSIS — M2040 Other hammer toe(s) (acquired), unspecified foot: Secondary | ICD-10-CM | POA: Diagnosis not present

## 2020-05-07 DIAGNOSIS — M79674 Pain in right toe(s): Secondary | ICD-10-CM | POA: Diagnosis not present

## 2020-05-07 DIAGNOSIS — B351 Tinea unguium: Secondary | ICD-10-CM | POA: Diagnosis not present

## 2020-05-07 DIAGNOSIS — M2010 Hallux valgus (acquired), unspecified foot: Secondary | ICD-10-CM | POA: Diagnosis not present

## 2020-05-07 DIAGNOSIS — M79675 Pain in left toe(s): Secondary | ICD-10-CM | POA: Diagnosis not present

## 2020-05-07 NOTE — Progress Notes (Signed)
This patient returns to the office for evaluation and treatment of long thick painful nails .  This patient is unable to trim her own nails since the patient cannot reach her feet.  Patient says the nails are painful walking and wearing his shoes.  He returns for preventive foot care services.  General Appearance  Alert, conversant and in no acute stress.  Vascular  Dorsalis pedis and posterior tibial  pulses are palpable  bilaterally.  Capillary return is within normal limits  bilaterally. Temperature is within normal limits  bilaterally.  Neurologic  Senn-Weinstein monofilament wire test within normal limits  bilaterally. Muscle power within normal limits bilaterally.  Nails Thick disfigured discolored nails with subungual debris  from hallux to fifth toes bilaterally. No evidence of bacterial infection or drainage bilaterally.  Orthopedic  No limitations of motion  feet .  No crepitus or effusions noted.   HAV 1st MPJ  B/L.  Hammer toes 2-5  B/L.  Skin  normotropic skin with no porokeratosis noted bilaterally.  No signs of infections or ulcers noted.     Onychomycosis  Pain in toes right foot  Pain in toes left foot  Debridement  of nails  1-5  B/L with a nail nipper.  Nails were then filed using a dremel tool with no incidents.    RTC 10 weeks    Gardiner Barefoot DPM

## 2020-05-13 DIAGNOSIS — H16223 Keratoconjunctivitis sicca, not specified as Sjogren's, bilateral: Secondary | ICD-10-CM | POA: Diagnosis not present

## 2020-05-14 DIAGNOSIS — Z23 Encounter for immunization: Secondary | ICD-10-CM | POA: Diagnosis not present

## 2020-05-15 ENCOUNTER — Ambulatory Visit (INDEPENDENT_AMBULATORY_CARE_PROVIDER_SITE_OTHER): Payer: Medicare Other | Admitting: Psychology

## 2020-05-15 DIAGNOSIS — F331 Major depressive disorder, recurrent, moderate: Secondary | ICD-10-CM | POA: Diagnosis not present

## 2020-05-19 DIAGNOSIS — L720 Epidermal cyst: Secondary | ICD-10-CM | POA: Diagnosis not present

## 2020-05-19 DIAGNOSIS — Z85820 Personal history of malignant melanoma of skin: Secondary | ICD-10-CM | POA: Diagnosis not present

## 2020-05-19 DIAGNOSIS — D225 Melanocytic nevi of trunk: Secondary | ICD-10-CM | POA: Diagnosis not present

## 2020-05-19 DIAGNOSIS — D485 Neoplasm of uncertain behavior of skin: Secondary | ICD-10-CM | POA: Diagnosis not present

## 2020-05-19 DIAGNOSIS — L814 Other melanin hyperpigmentation: Secondary | ICD-10-CM | POA: Diagnosis not present

## 2020-05-19 DIAGNOSIS — L821 Other seborrheic keratosis: Secondary | ICD-10-CM | POA: Diagnosis not present

## 2020-05-19 DIAGNOSIS — Z85828 Personal history of other malignant neoplasm of skin: Secondary | ICD-10-CM | POA: Diagnosis not present

## 2020-05-29 ENCOUNTER — Ambulatory Visit (INDEPENDENT_AMBULATORY_CARE_PROVIDER_SITE_OTHER): Payer: Medicare Other | Admitting: Psychology

## 2020-05-29 DIAGNOSIS — F331 Major depressive disorder, recurrent, moderate: Secondary | ICD-10-CM | POA: Diagnosis not present

## 2020-06-09 NOTE — Progress Notes (Signed)
PATIENT: Sylvia Maldonado DOB: 1940/11/27  REASON FOR VISIT: follow up HISTORY FROM: patient  HISTORY OF PRESENT ILLNESS: Today 06/10/20  [name] is a 79 year old female with history of depression on Abilify, issues with secondary parkinsonism.  Has mild tremor of the left upper extremity (she doesn't even notice), mild masking of the face. Has some mild memory issues that have been stable, MMSE 29/30 today.  Has remained overall stable.  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  Lives alone, manages her own affairs, drives well.  No falls, good stability with ambulation.  She is quite active, walks several miles a week.  Her depression is stable, on Abilify and Celexa.  Has routine follow-up with Dr. Casimiro Needle.  Presents today for evaluation unaccompanied.  HISTORY 12/05/2019 Dr. Jannifer Franklin: [name] is a 79 year old right-handed white female with a history of depression on Abilify, and some issues with secondary parkinsonism.  The patient has a mild tremor involving the left upper extremity, she has mild masking of the face.  She remains quite active, she will walk about 12 miles a week.  She has mild memory disturbance, this has been relatively stable over the last year to 18 months.  The patient has not given up any activities day living because of memory.  She does not believe there has been any significant changes in her physical abilities since last seen.  She claims that her depression has strong seasonal variations, shorter days of the winter bring on severe depression.   REVIEW OF SYSTEMS: Out of a complete 14 system review of symptoms, the patient complains only of the following symptoms, and all other reviewed systems are negative.  tremor  ALLERGIES: Allergies  Allergen Reactions   Doxycycline Rash   Neosporin [Neomycin-Bacitracin Zn-Polymyx] Swelling and Rash    Swelling and rash   Penicillins Swelling and Rash    swelling and rash   Sulfa Antibiotics Swelling and  Rash   Latex Rash    rash   Prednisone Anxiety   Acetaminophen     HOME MEDICATIONS: Outpatient Medications Prior to Visit  Medication Sig Dispense Refill   ARIPiprazole (ABILIFY) 10 MG tablet 1  qam 90 tablet 2   aspirin 81 MG tablet Take 81 mg by mouth every other day.      Cholecalciferol (VITAMIN D-3) 1000 UNITS CAPS Take 2,000 Units by mouth daily.     citalopram (CELEXA) 20 MG tablet Take 1 tablet (20 mg total) by mouth daily. 90 tablet 2   cycloSPORINE (RESTASIS) 0.05 % ophthalmic emulsion Place 1 drop into both eyes daily.      traMADol (ULTRAM) 50 MG tablet TK 1 T PO Q 8 TO 12 H PRN     vitamin B-12 (CYANOCOBALAMIN) 1000 MCG tablet Take 1,000 mcg by mouth daily.     No facility-administered medications prior to visit.    PAST MEDICAL HISTORY: Past Medical History:  Diagnosis Date   Cataract    Depression    Diverticulosis    Dry eyes    Hx of adenomatous colonic polyps    Internal hemorrhoids    Parkinsonism ([place]) 07/19/2018   On Abilify    PAST SURGICAL HISTORY: Past Surgical History:  Procedure Laterality Date   cataracts surg     COLONOSCOPY     EYE SURGERY     PELVIC FLOOR REPAIR     TONSILLECTOMY     VAGINAL HYSTERECTOMY      FAMILY HISTORY: Family History  Problem  Relation Age of Onset   CAD Neg Hx     SOCIAL HISTORY: Social History   Socioeconomic History   Marital status: Single    Spouse name: Not on file   Number of children: Not on file   Years of education: Not on file   Highest education level: Not on file  Occupational History   Not on file  Tobacco Use   Smoking status: Never Smoker   Smokeless tobacco: Never Used  Vaping Use   Vaping Use: Never used  Substance and Sexual Activity   Alcohol use: No   Drug use: No   Sexual activity: Not Currently  Other Topics Concern   Not on file  Social History Narrative   Right handed    Lives alone at home    No caffeine usage    Social  Determinants of Health   Financial Resource Strain:    Difficulty of Paying Living Expenses: Not on file  Food Insecurity:    Worried About [place] in the Last Year: Not on file   Ran Out of Food in the Last Year: Not on file  Transportation Needs:    Lack of Transportation (Medical): Not on file   Lack of Transportation (Non-Medical): Not on file  Physical Activity:    Days of Exercise per Week: Not on file   Minutes of Exercise per Session: Not on file  Stress:    Feeling of Stress : Not on file  Social Connections:    Frequency of Communication with Friends and Family: Not on file   Frequency of Social Gatherings with Friends and Family: Not on file   Attends Religious Services: Not on file   Active Member of Clubs or Organizations: Not on file   Attends Archivist Meetings: Not on file   Marital Status: Not on file  Intimate Partner Violence:    Fear of Current or Ex-Partner: Not on file   Emotionally Abused: Not on file   Physically Abused: Not on file   Sexually Abused: Not on file   PHYSICAL EXAM  Vitals:   06/10/20 0839  BP: 124/70  Pulse: 61  Weight: 131 lb 12.8 oz (59.8 kg)  Height: 5\' 4"  (1.626 m)   Body mass index is 22.62 kg/m.  Generalized: Well developed, in no acute distress  MMSE - Mini Mental State Exam 06/10/2020 08/07/2019 07/19/2018  Orientation to time 5 4 5   Orientation to Place 5 5 5   Registration 3 3 3   Attention/ Calculation 5 5 5   Recall 2 3 2   Language- name 2 objects 2 2 2   Language- repeat 1 1 1   Language- follow 3 step command 3 3 3   Language- read & follow direction 1 1 1   Write a sentence 1 1 1   Copy design 1 1 1   Total score 29 29 29     Neurological examination  Mentation: Alert oriented to time, place, history taking. Follows all commands speech and language fluent Cranial nerve II-XII: Pupils were equal round reactive to light. Extraocular movements were full, visual field were full on  confrontational test. Facial sensation and strength were normal. Head turning and shoulder shrug  were normal and symmetric.  Mild masking of the face is seen. Motor: The motor testing reveals 5 over 5 strength of all 4 extremities. Good symmetric motor tone is noted throughout.  No resting tremor noted. Sensory: Sensory testing is intact to soft touch on all 4 extremities. No evidence of  extinction is noted.  Coordination: Cerebellar testing reveals good finger-nose-finger and heel-to-shin bilaterally. No tremor seen with finger nose finger. Gait and station: Gait is normal, slight decreased arm swing on the right, no tremor, is steady. Reflexes: Deep tendon reflexes are symmetric and normal bilaterally.   DIAGNOSTIC DATA (LABS, IMAGING, TESTING) - I reviewed patient records, labs, notes, testing and imaging myself where available.  Lab Results  Component Value Date   WBC 4.3 06/26/2013   HGB 13.3 06/26/2013   HCT 40.3 06/26/2013   MCV 90.8 06/26/2013   PLT 167 06/26/2013      Component Value Date/Time   NA 138 06/26/2013 2230   K 4.2 06/26/2013 2230   CL 103 06/26/2013 2230   CO2 27 06/26/2013 2230   GLUCOSE 116 (H) 06/26/2013 2230   BUN 28 (H) 06/26/2013 2230   CREATININE 0.68 06/26/2013 2230   CALCIUM 9.6 06/26/2013 2230   GFRNONAA 85 (L) 06/26/2013 2230   GFRAA >90 06/26/2013 2230   No results found for: CHOL, HDL, LDLCALC, LDLDIRECT, TRIG, CHOLHDL No results found for: HGBA1C No results found for: VITAMINB12 No results found for: TSH  ASSESSMENT AND PLAN 79 y.o. year old female  has a past medical history of Cataract, Depression, Diverticulosis, Dry eyes, adenomatous colonic polyps, Internal hemorrhoids, and Parkinsonism ([place]) (07/19/2018). here with:  1.  Secondary parkinsonism 2.  Depression 3.  Memory disturbance (MMSE 29/30)  She remains overall stable, no significant change.  We will not add any medications for Parkinson's disease at this point.  She remains on  Abilify and Celexa from psychiatry. She is gainfully employed part-time and functioning well living on her own.  She will follow-up in 6 months or sooner if needed.  I spent 20 minutes of face-to-face and non-face-to-face time with patient.  This included previsit chart review, lab review, study review, order entry, electronic health record documentation, patient education.  Butler Denmark, AGNP-C, DNP 06/10/2020, 9:04 AM [facility] [address], [place], [place] [address] [phone]

## 2020-06-10 ENCOUNTER — Other Ambulatory Visit: Payer: Self-pay

## 2020-06-10 ENCOUNTER — Encounter: Payer: Self-pay | Admitting: Neurology

## 2020-06-10 ENCOUNTER — Ambulatory Visit (INDEPENDENT_AMBULATORY_CARE_PROVIDER_SITE_OTHER): Payer: Medicare Other | Admitting: Neurology

## 2020-06-10 VITALS — BP 124/70 | HR 61 | Ht 64.0 in | Wt 131.8 lb

## 2020-06-10 DIAGNOSIS — G2111 Neuroleptic induced parkinsonism: Secondary | ICD-10-CM

## 2020-06-10 DIAGNOSIS — F331 Major depressive disorder, recurrent, moderate: Secondary | ICD-10-CM | POA: Diagnosis not present

## 2020-06-10 NOTE — Progress Notes (Signed)
I have read the note, and I agree with the clinical assessment and plan.  Keyontae Huckeby K Jaesean Litzau   

## 2020-06-10 NOTE — Patient Instructions (Signed)
Great to see you today! Continue  Current medications Memory is stable See you back in 6 months

## 2020-06-12 ENCOUNTER — Ambulatory Visit (INDEPENDENT_AMBULATORY_CARE_PROVIDER_SITE_OTHER): Payer: Medicare Other | Admitting: Psychology

## 2020-06-12 DIAGNOSIS — F331 Major depressive disorder, recurrent, moderate: Secondary | ICD-10-CM | POA: Diagnosis not present

## 2020-06-17 DIAGNOSIS — G2111 Neuroleptic induced parkinsonism: Secondary | ICD-10-CM | POA: Diagnosis not present

## 2020-06-17 DIAGNOSIS — E7800 Pure hypercholesterolemia, unspecified: Secondary | ICD-10-CM | POA: Diagnosis not present

## 2020-06-17 DIAGNOSIS — F339 Major depressive disorder, recurrent, unspecified: Secondary | ICD-10-CM | POA: Diagnosis not present

## 2020-06-26 ENCOUNTER — Ambulatory Visit (INDEPENDENT_AMBULATORY_CARE_PROVIDER_SITE_OTHER): Payer: Medicare Other | Admitting: Psychology

## 2020-06-26 DIAGNOSIS — F331 Major depressive disorder, recurrent, moderate: Secondary | ICD-10-CM | POA: Diagnosis not present

## 2020-07-08 DIAGNOSIS — Z20822 Contact with and (suspected) exposure to covid-19: Secondary | ICD-10-CM | POA: Diagnosis not present

## 2020-07-10 ENCOUNTER — Ambulatory Visit (INDEPENDENT_AMBULATORY_CARE_PROVIDER_SITE_OTHER): Payer: Medicare Other | Admitting: Psychology

## 2020-07-10 DIAGNOSIS — F331 Major depressive disorder, recurrent, moderate: Secondary | ICD-10-CM | POA: Diagnosis not present

## 2020-07-10 DIAGNOSIS — Z20822 Contact with and (suspected) exposure to covid-19: Secondary | ICD-10-CM | POA: Diagnosis not present

## 2020-07-16 ENCOUNTER — Encounter: Payer: Self-pay | Admitting: Podiatry

## 2020-07-16 ENCOUNTER — Other Ambulatory Visit: Payer: Self-pay

## 2020-07-16 ENCOUNTER — Ambulatory Visit (INDEPENDENT_AMBULATORY_CARE_PROVIDER_SITE_OTHER): Payer: Medicare Other | Admitting: Podiatry

## 2020-07-16 DIAGNOSIS — M2040 Other hammer toe(s) (acquired), unspecified foot: Secondary | ICD-10-CM | POA: Diagnosis not present

## 2020-07-16 DIAGNOSIS — M79674 Pain in right toe(s): Secondary | ICD-10-CM

## 2020-07-16 DIAGNOSIS — M2010 Hallux valgus (acquired), unspecified foot: Secondary | ICD-10-CM

## 2020-07-16 DIAGNOSIS — M79675 Pain in left toe(s): Secondary | ICD-10-CM

## 2020-07-16 DIAGNOSIS — B351 Tinea unguium: Secondary | ICD-10-CM | POA: Diagnosis not present

## 2020-07-16 NOTE — Progress Notes (Signed)
This patient returns to the office for evaluation and treatment of long thick painful nails .  This patient is unable to trim her own nails since the patient cannot reach her feet.  Patient says the nails are painful walking and wearing his shoes.  She returns for preventive foot care services.  General Appearance  Alert, conversant and in no acute stress.  Vascular  Dorsalis pedis and posterior tibial  pulses are palpable  bilaterally.  Capillary return is within normal limits  bilaterally. Temperature is within normal limits  bilaterally.  Neurologic  Senn-Weinstein monofilament wire test within normal limits  bilaterally. Muscle power within normal limits bilaterally.  Nails Thick disfigured discolored nails with subungual debris  from hallux to fifth toes bilaterally. No evidence of bacterial infection or drainage bilaterally.  Orthopedic  No limitations of motion  feet .  No crepitus or effusions noted.   HAV 1st MPJ  B/L.  Hammer toes 2-5  B/L.  Skin  normotropic skin with no porokeratosis noted bilaterally.  No signs of infections or ulcers noted.     Onychomycosis  Pain in toes right foot  Pain in toes left foot  Debridement  of nails  1-5  B/L with a nail nipper.  Nails were then filed using a dremel tool with no incidents.    RTC 10 weeks    Helane Gunther DPM

## 2020-07-18 DIAGNOSIS — R519 Headache, unspecified: Secondary | ICD-10-CM | POA: Diagnosis not present

## 2020-07-24 ENCOUNTER — Ambulatory Visit (INDEPENDENT_AMBULATORY_CARE_PROVIDER_SITE_OTHER): Payer: Medicare Other | Admitting: Psychology

## 2020-07-24 DIAGNOSIS — F331 Major depressive disorder, recurrent, moderate: Secondary | ICD-10-CM | POA: Diagnosis not present

## 2020-08-07 ENCOUNTER — Ambulatory Visit (INDEPENDENT_AMBULATORY_CARE_PROVIDER_SITE_OTHER): Payer: Medicare Other | Admitting: Psychology

## 2020-08-07 DIAGNOSIS — F331 Major depressive disorder, recurrent, moderate: Secondary | ICD-10-CM

## 2020-08-21 ENCOUNTER — Ambulatory Visit (INDEPENDENT_AMBULATORY_CARE_PROVIDER_SITE_OTHER): Payer: Medicare Other | Admitting: Psychology

## 2020-08-21 DIAGNOSIS — F331 Major depressive disorder, recurrent, moderate: Secondary | ICD-10-CM | POA: Diagnosis not present

## 2020-09-03 ENCOUNTER — Telehealth (INDEPENDENT_AMBULATORY_CARE_PROVIDER_SITE_OTHER): Payer: Medicare Other | Admitting: Psychiatry

## 2020-09-03 ENCOUNTER — Other Ambulatory Visit: Payer: Self-pay

## 2020-09-03 DIAGNOSIS — F325 Major depressive disorder, single episode, in full remission: Secondary | ICD-10-CM | POA: Diagnosis not present

## 2020-09-03 MED ORDER — CITALOPRAM HYDROBROMIDE 20 MG PO TABS: 20.0000 mg | ORAL_TABLET | Freq: Every day | ORAL | 2 refills | Status: DC

## 2020-09-03 MED ORDER — ARIPIPRAZOLE 10 MG PO TABS: ORAL_TABLET | ORAL | 2 refills | Status: DC

## 2020-09-03 NOTE — Progress Notes (Signed)
Patient ID: Sylvia Maldonado, female   DOB: 1941-05-21, 80 y.o.   MRN: [id number] [facility] MD Progress Note  09/03/2020 2:29 PM Markelle Asaro [place]  MRN:  [id number] Subjective: I am feel awful; I feel depressed. Principal Problem: Major depression in remission Diagnosis: Major depression recurrent   Today the patient actually is doing quite well.  Her mood is good.  She is sleeping and eating well.  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  The patient tells me that she eats organically.  She only ate some beef and salmon but otherwise she eats fresh fruit and vegetables.  She is sleeping and eating very well.  She is got good energy.  He is very healthy.  She loves to walk.  She has been seeing Dr. Redmond Pulling neurologist for a little bit of a right hand tremor.  He says it is some type of syndrome.  She says he says that it is not related to her medicines.  The patient is on 10 mg of Abilify.  At 1 point she did very well and we discontinued it and she fell back into a depression.  She now back on Abilify for over a year and is done great.  Financially she is well.  Her family is here and they are doing great.  The patient would like to lose a more [place] area like [place] but she is adjusting fairly well.  She is functioning extremely well.  She is articulate and functioning extremely well.  Past Surgical History:  Procedure Laterality Date  . cataracts surg    . COLONOSCOPY    . EYE SURGERY    . PELVIC FLOOR REPAIR    . TONSILLECTOMY    . VAGINAL HYSTERECTOMY     Family History:  Family History  Problem Relation Age of Onset  . CAD Neg Hx    Family Psychiatric  History:  Social History:  Social History   Substance and Sexual Activity  Alcohol Use No     Social History   Substance and Sexual Activity  Drug Use No    Social History   Socioeconomic History  . Marital  status: Single    Spouse name: Not on file  . Number of children: Not on file  . Years of education: Not on file  . Highest education level: Not on file  Occupational History  . Not on file  Tobacco Use  . Smoking status: Never Smoker  . Smokeless tobacco: Never Used  Vaping Use  . Vaping Use: Never used  Substance and Sexual Activity  . Alcohol use: No  . Drug use: No  . Sexual activity: Not Currently  Other Topics Concern  . Not on file  Social History Narrative   Right handed    Lives alone at home    No caffeine usage    Social Determinants of Health   Financial Resource Strain: Not on file  Food Insecurity: Not on file  Transportation Needs: Not on file  Physical Activity: Not on file  Stress: Not on file  Social Connections: Not on file   Additional Social History:                         Sleep: Good  Appetite:  Good  Current Medications: Current Outpatient Medications  Medication Sig Dispense Refill  .  ARIPiprazole (ABILIFY) 10 MG tablet 1  qam 90 tablet 2  . aspirin 81 MG tablet Take 81 mg by mouth every other day.     . Cholecalciferol (VITAMIN D-3) 1000 UNITS CAPS Take 2,000 Units by mouth daily.    . citalopram (CELEXA) 20 MG tablet Take 1 tablet (20 mg total) by mouth daily. 90 tablet 2  . cycloSPORINE (RESTASIS) 0.05 % ophthalmic emulsion Place 1 drop into both eyes daily.     . traMADol (ULTRAM) 50 MG tablet TK 1 T PO Q 8 TO 12 H PRN    . vitamin B-12 (CYANOCOBALAMIN) 1000 MCG tablet Take 1,000 mcg by mouth daily.     No current facility-administered medications for this visit.    Lab Results: No results found for this or any previous visit (from the past 48 hour(s)).  Physical Findings: AIMS:  , ,  ,  ,    CIWA:    COWS:     Musculoskeletal: Strength & Muscle Tone: within normal limits Gait & Station: normal Patient leans: Right  Psychiatric Specialty Exam: ROS  There were no vitals taken for this visit.There is no height or  weight on file to calculate BMI.  General Appearance: Casual  Eye Contact::  Good  Speech:  Clear and Coherent  Volume:  Normal  Mood:  NA  Affect:  Congruent  Thought Process:  Coherent  Orientation:  Full (Time, Place, and Person)  Thought Content:  WDL  Suicidal Thoughts:  No  Homicidal Thoughts:  No  Memory:  NA  Judgement:  NA  Insight:  Good  Psychomotor Activity:  Normal  Concentration:  Good  Recall:  Good  Fund of Knowledge:Good  Language: Good  Akathisia:  No  Handed:  Right  AIMS (if indicated):     Assets:  Desire for Improvement  ADL's:  Intact  Cognition: WNL  Sleep:       Sylvia Maldonado 09/03/2020, 2:29 PM  This patient has 1 problem that of major depression.  She is doing very well taking 20 mg of Celexa and she is also on Abilify 10 mg.  At this point I think it is very important that I see the patient person-to-person and assess for tremor.  Over the years I have not found him to be evidence of being tardive dyskinesia.  But I think it is time for reassessment.  Patient emotionally is very stable.  She will return to see me in 2 months.

## 2020-09-04 ENCOUNTER — Telehealth (HOSPITAL_COMMUNITY): Payer: Medicare Other | Admitting: Psychiatry

## 2020-09-04 ENCOUNTER — Ambulatory Visit (INDEPENDENT_AMBULATORY_CARE_PROVIDER_SITE_OTHER): Payer: Medicare Other | Admitting: Psychology

## 2020-09-04 DIAGNOSIS — F331 Major depressive disorder, recurrent, moderate: Secondary | ICD-10-CM

## 2020-09-04 DIAGNOSIS — L309 Dermatitis, unspecified: Secondary | ICD-10-CM | POA: Diagnosis not present

## 2020-09-08 DIAGNOSIS — G2111 Neuroleptic induced parkinsonism: Secondary | ICD-10-CM | POA: Diagnosis not present

## 2020-09-08 DIAGNOSIS — E7800 Pure hypercholesterolemia, unspecified: Secondary | ICD-10-CM | POA: Diagnosis not present

## 2020-09-08 DIAGNOSIS — F339 Major depressive disorder, recurrent, unspecified: Secondary | ICD-10-CM | POA: Diagnosis not present

## 2020-09-18 ENCOUNTER — Ambulatory Visit (INDEPENDENT_AMBULATORY_CARE_PROVIDER_SITE_OTHER): Payer: Medicare Other | Admitting: Psychology

## 2020-09-18 DIAGNOSIS — F331 Major depressive disorder, recurrent, moderate: Secondary | ICD-10-CM

## 2020-09-22 DIAGNOSIS — L719 Rosacea, unspecified: Secondary | ICD-10-CM | POA: Diagnosis not present

## 2020-09-22 DIAGNOSIS — L821 Other seborrheic keratosis: Secondary | ICD-10-CM | POA: Diagnosis not present

## 2020-09-30 DIAGNOSIS — Z1231 Encounter for screening mammogram for malignant neoplasm of breast: Secondary | ICD-10-CM | POA: Diagnosis not present

## 2020-09-30 DIAGNOSIS — Z01419 Encounter for gynecological examination (general) (routine) without abnormal findings: Secondary | ICD-10-CM | POA: Diagnosis not present

## 2020-10-01 DIAGNOSIS — H40013 Open angle with borderline findings, low risk, bilateral: Secondary | ICD-10-CM | POA: Diagnosis not present

## 2020-10-02 ENCOUNTER — Ambulatory Visit (INDEPENDENT_AMBULATORY_CARE_PROVIDER_SITE_OTHER): Payer: Medicare Other | Admitting: Psychology

## 2020-10-02 DIAGNOSIS — F331 Major depressive disorder, recurrent, moderate: Secondary | ICD-10-CM

## 2020-10-08 ENCOUNTER — Other Ambulatory Visit: Payer: Self-pay

## 2020-10-08 ENCOUNTER — Ambulatory Visit (INDEPENDENT_AMBULATORY_CARE_PROVIDER_SITE_OTHER): Payer: Medicare Other | Admitting: Podiatry

## 2020-10-08 ENCOUNTER — Encounter: Payer: Self-pay | Admitting: Podiatry

## 2020-10-08 DIAGNOSIS — M79674 Pain in right toe(s): Secondary | ICD-10-CM

## 2020-10-08 DIAGNOSIS — B351 Tinea unguium: Secondary | ICD-10-CM | POA: Diagnosis not present

## 2020-10-08 DIAGNOSIS — M79675 Pain in left toe(s): Secondary | ICD-10-CM | POA: Diagnosis not present

## 2020-10-08 DIAGNOSIS — M2010 Hallux valgus (acquired), unspecified foot: Secondary | ICD-10-CM

## 2020-10-08 DIAGNOSIS — M2040 Other hammer toe(s) (acquired), unspecified foot: Secondary | ICD-10-CM | POA: Diagnosis not present

## 2020-10-08 NOTE — Progress Notes (Signed)
This patient returns to the office for evaluation and treatment of long thick painful nails .  This patient is unable to trim her own nails since the patient cannot reach her feet.  Patient says the nails are painful walking and wearing his shoes.  She returns for preventive foot care services.  General Appearance  Alert, conversant and in no acute stress.  Vascular  Dorsalis pedis and posterior tibial  pulses are palpable  bilaterally.  Capillary return is within normal limits  bilaterally. Temperature is within normal limits  bilaterally.  Neurologic  Senn-Weinstein monofilament wire test within normal limits  bilaterally. Muscle power within normal limits bilaterally.  Nails Thick disfigured discolored nails with subungual debris  from hallux to fifth toes bilaterally. No evidence of bacterial infection or drainage bilaterally.  Orthopedic  No limitations of motion  feet .  No crepitus or effusions noted.   HAV 1st MPJ  B/L.  Hammer toes 2-5  B/L.  Skin  normotropic skin with no porokeratosis noted bilaterally.  No signs of infections or ulcers noted.     Onychomycosis  Pain in toes right foot  Pain in toes left foot  Debridement  of nails  1-5  B/L with a nail nipper.  Nails were then filed using a dremel tool with no incidents. Padding dispensed for hammer toe.   RTC 10 weeks    Gardiner Barefoot DPM

## 2020-10-15 DIAGNOSIS — Z23 Encounter for immunization: Secondary | ICD-10-CM | POA: Diagnosis not present

## 2020-10-16 ENCOUNTER — Ambulatory Visit (INDEPENDENT_AMBULATORY_CARE_PROVIDER_SITE_OTHER): Payer: Medicare Other | Admitting: Psychology

## 2020-10-16 DIAGNOSIS — F331 Major depressive disorder, recurrent, moderate: Secondary | ICD-10-CM

## 2020-10-22 DIAGNOSIS — Z0000 Encounter for general adult medical examination without abnormal findings: Secondary | ICD-10-CM | POA: Diagnosis not present

## 2020-10-22 DIAGNOSIS — F339 Major depressive disorder, recurrent, unspecified: Secondary | ICD-10-CM | POA: Diagnosis not present

## 2020-10-22 DIAGNOSIS — E559 Vitamin D deficiency, unspecified: Secondary | ICD-10-CM | POA: Diagnosis not present

## 2020-10-22 DIAGNOSIS — Z1389 Encounter for screening for other disorder: Secondary | ICD-10-CM | POA: Diagnosis not present

## 2020-10-22 DIAGNOSIS — Z6822 Body mass index (BMI) 22.0-22.9, adult: Secondary | ICD-10-CM | POA: Diagnosis not present

## 2020-10-22 DIAGNOSIS — M8588 Other specified disorders of bone density and structure, other site: Secondary | ICD-10-CM | POA: Diagnosis not present

## 2020-11-04 ENCOUNTER — Telehealth (HOSPITAL_COMMUNITY): Payer: Medicare Other | Admitting: Psychiatry

## 2020-11-05 ENCOUNTER — Other Ambulatory Visit: Payer: Self-pay

## 2020-11-05 ENCOUNTER — Telehealth (INDEPENDENT_AMBULATORY_CARE_PROVIDER_SITE_OTHER): Payer: Medicare Other | Admitting: Psychiatry

## 2020-11-05 DIAGNOSIS — F325 Major depressive disorder, single episode, in full remission: Secondary | ICD-10-CM | POA: Diagnosis not present

## 2020-11-05 MED ORDER — ARIPIPRAZOLE 10 MG PO TABS: ORAL_TABLET | ORAL | 2 refills | Status: DC

## 2020-11-05 MED ORDER — CITALOPRAM HYDROBROMIDE 20 MG PO TABS: 20.0000 mg | ORAL_TABLET | Freq: Every day | ORAL | 2 refills | Status: DC

## 2020-11-05 NOTE — Progress Notes (Signed)
Patient ID: Sylvia Maldonado, female   DOB: 1941-01-25, 80 y.o.   MRN: [id number] [facility] MD Progress Note  11/05/2020 3:43 PM Sylvia Maldonado  MRN:  [id number] Subjective: I am feel awful; I feel depressed. Principal Problem: Major depression in remission Diagnosis: Major depression recurrent  Today the patient is doing well.  She is at her baseline.  There is some pharmacy confusion and she actually has been off of the Celexa for a short period of time.  I wished her to stay on the Celexa and I told her that.  We called the pharmacy we clarified that she was operated off an old prescription.  She will go back to 20 mg.  Fortunately her mood is great.  She is sleeping and eating well.  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  The patient has no indications of any side effects from her medicines.  She does not show any evidence or describe any evidence of tardive dyskinesia.  Patient is still walking.  She is sleeping and eating very well.  She got great energy.  She works for [employer].  Her family is doing great.  She is fully vaccinated.  The patient's health is very good.  Financially she is stable.  Past Surgical History:  Procedure Laterality Date  . cataracts surg    . COLONOSCOPY    . EYE SURGERY    . PELVIC FLOOR REPAIR    . TONSILLECTOMY    . VAGINAL HYSTERECTOMY     Family History:  Family History  Problem Relation Age of Onset  . CAD Neg Hx    Family Psychiatric  History:  Social History:  Social History   Substance and Sexual Activity  Alcohol Use No     Social History   Substance and Sexual Activity  Drug Use No    Social History   Socioeconomic History  . Marital status: Single    Spouse name: Not on file  . Number of children: Not on file  . Years of education: Not on file  . Highest education level: Not on file  Occupational History  . Not on file  Tobacco Use  . Smoking status: Never Smoker  . Smokeless tobacco: Never Used  Vaping Use  .  Vaping Use: Never used  Substance and Sexual Activity  . Alcohol use: No  . Drug use: No  . Sexual activity: Not Currently  Other Topics Concern  . Not on file  Social History Narrative   Right handed    Lives alone at home    No caffeine usage    Social Determinants of Health   Financial Resource Strain: Not on file  Food Insecurity: Not on file  Transportation Needs: Not on file  Physical Activity: Not on file  Stress: Not on file  Social Connections: Not on file   Additional Social History:                         Sleep: Good  Appetite:  Good  Current Medications: Current Outpatient Medications  Medication Sig Dispense Refill  . ARIPiprazole (ABILIFY) 10 MG tablet 1  qam 90 tablet 2  . aspirin 81 MG tablet Take 81 mg by mouth every other day.     . Cholecalciferol (VITAMIN D-3) 1000 UNITS CAPS Take 2,000 Units by mouth daily.    . citalopram (CELEXA) 20 MG tablet Take 1 tablet (20 mg total) by mouth daily.  90 tablet 2  . cycloSPORINE (RESTASIS) 0.05 % ophthalmic emulsion Place 1 drop into both eyes daily.     . traMADol (ULTRAM) 50 MG tablet TK 1 T PO Q 8 TO 12 H PRN    . vitamin B-12 (CYANOCOBALAMIN) 1000 MCG tablet Take 1,000 mcg by mouth daily.     No current facility-administered medications for this visit.    Lab Results: No results found for this or any previous visit (from the past 48 hour(s)).  Physical Findings: AIMS:  , ,  ,  ,    CIWA:    COWS:     Musculoskeletal: Strength & Muscle Tone: within normal limits Gait & Station: normal Patient leans: Right  Psychiatric Specialty Exam: ROS  There were no vitals taken for this visit.There is no height or weight on file to calculate BMI.  General Appearance: Casual  Eye Contact::  Good  Speech:  Clear and Coherent  Volume:  Normal  Mood:  NA  Affect:  Congruent  Thought Process:  Coherent  Orientation:  Full (Time, Place, and Person)  Thought Content:  WDL  Suicidal Thoughts:  No   Homicidal Thoughts:  No  Memory:  NA  Judgement:  NA  Insight:  Good  Psychomotor Activity:  Normal  Concentration:  Good  Recall:  Good  Fund of Knowledge:Good  Language: Good  Akathisia:  No  Handed:  Right  AIMS (if indicated):     Assets:  Desire for Improvement  ADL's:  Intact  Cognition: WNL  Sleep:       [name] 11/05/2020, 3:43 PM  This patient has 1 problem.  He is major depression in remission.  She will continue taking Celexa 20 mg a day and Abilify 10 mg.  Once again it is noted that in the past she came off of Abilify and fell into a deep depression.  Now back on the Abilify for over the last year she done very well.  She shows no evidence of tardive dyskinesia or any problems with the Abilify.  For now we will continue both the Celexa and the Abilify and she will return to see me in person in 3 months.

## 2020-11-12 DIAGNOSIS — M47816 Spondylosis without myelopathy or radiculopathy, lumbar region: Secondary | ICD-10-CM | POA: Diagnosis not present

## 2020-11-13 ENCOUNTER — Ambulatory Visit (INDEPENDENT_AMBULATORY_CARE_PROVIDER_SITE_OTHER): Payer: Medicare Other | Admitting: Psychology

## 2020-11-13 DIAGNOSIS — F331 Major depressive disorder, recurrent, moderate: Secondary | ICD-10-CM | POA: Diagnosis not present

## 2020-11-26 ENCOUNTER — Ambulatory Visit (INDEPENDENT_AMBULATORY_CARE_PROVIDER_SITE_OTHER): Payer: Medicare Other | Admitting: Podiatry

## 2020-11-26 ENCOUNTER — Other Ambulatory Visit: Payer: Self-pay

## 2020-11-26 ENCOUNTER — Encounter: Payer: Self-pay | Admitting: Podiatry

## 2020-11-26 DIAGNOSIS — M79674 Pain in right toe(s): Secondary | ICD-10-CM | POA: Diagnosis not present

## 2020-11-26 DIAGNOSIS — E7800 Pure hypercholesterolemia, unspecified: Secondary | ICD-10-CM | POA: Insufficient documentation

## 2020-11-26 DIAGNOSIS — L84 Corns and callosities: Secondary | ICD-10-CM

## 2020-11-26 DIAGNOSIS — M5136 Other intervertebral disc degeneration, lumbar region: Secondary | ICD-10-CM | POA: Insufficient documentation

## 2020-11-26 DIAGNOSIS — B351 Tinea unguium: Secondary | ICD-10-CM | POA: Diagnosis not present

## 2020-11-26 DIAGNOSIS — R899 Unspecified abnormal finding in specimens from other organs, systems and tissues: Secondary | ICD-10-CM | POA: Insufficient documentation

## 2020-11-26 DIAGNOSIS — M8588 Other specified disorders of bone density and structure, other site: Secondary | ICD-10-CM | POA: Insufficient documentation

## 2020-11-26 DIAGNOSIS — M79675 Pain in left toe(s): Secondary | ICD-10-CM

## 2020-11-26 DIAGNOSIS — E559 Vitamin D deficiency, unspecified: Secondary | ICD-10-CM | POA: Insufficient documentation

## 2020-11-26 DIAGNOSIS — R159 Full incontinence of feces: Secondary | ICD-10-CM | POA: Insufficient documentation

## 2020-11-26 DIAGNOSIS — F339 Major depressive disorder, recurrent, unspecified: Secondary | ICD-10-CM | POA: Insufficient documentation

## 2020-11-26 DIAGNOSIS — Z85820 Personal history of malignant melanoma of skin: Secondary | ICD-10-CM | POA: Insufficient documentation

## 2020-11-26 DIAGNOSIS — N900 Mild vulvar dysplasia: Secondary | ICD-10-CM | POA: Insufficient documentation

## 2020-11-26 NOTE — Progress Notes (Signed)
This patient returns to the office for evaluation and treatment of long thick painful nails .  This patient is unable to trim her own nails since the patient cannot reach her feet.  Patient says the nails are painful walking and wearing his shoes.  She returns for preventive foot care services.  General Appearance  Alert, conversant and in no acute stress.  Vascular  Dorsalis pedis and posterior tibial  pulses are palpable  bilaterally.  Capillary return is within normal limits  bilaterally. Temperature is within normal limits  bilaterally.  Neurologic  Senn-Weinstein monofilament wire test within normal limits  bilaterally. Muscle power within normal limits bilaterally.  Nails Thick disfigured discolored nails with subungual debris  from hallux to fifth toes bilaterally. No evidence of bacterial infection or drainage bilaterally.  Orthopedic  No limitations of motion  feet .  No crepitus or effusions noted.   HAV 1st MPJ  B/L.  Hammer toes 2-5  B/L. Plantar flexed fifth met  B/L.  Skin  normotropic skin with no porokeratosis noted bilaterally.  No signs of infections or ulcers noted.   Callus subfifth met  B/L.  Onychomycosis  Pain in toes right foot  Pain in toes left foot  Porokeratosis  B/L.  Debridement  of nails  1-5  B/L with a nail nipper.  Nails were then filed using a dremel tool with no incidents. Debride porokeratosis with # 15 blade  B/L.   RTC 10 weeks      DPM  

## 2020-11-27 ENCOUNTER — Ambulatory Visit (INDEPENDENT_AMBULATORY_CARE_PROVIDER_SITE_OTHER): Payer: Medicare Other | Admitting: Psychology

## 2020-11-27 DIAGNOSIS — F331 Major depressive disorder, recurrent, moderate: Secondary | ICD-10-CM

## 2020-12-11 ENCOUNTER — Ambulatory Visit (INDEPENDENT_AMBULATORY_CARE_PROVIDER_SITE_OTHER): Payer: Medicare Other | Admitting: Psychology

## 2020-12-11 DIAGNOSIS — F331 Major depressive disorder, recurrent, moderate: Secondary | ICD-10-CM | POA: Diagnosis not present

## 2020-12-15 ENCOUNTER — Ambulatory Visit (INDEPENDENT_AMBULATORY_CARE_PROVIDER_SITE_OTHER): Payer: Medicare Other | Admitting: Neurology

## 2020-12-15 ENCOUNTER — Encounter: Payer: Self-pay | Admitting: Neurology

## 2020-12-15 VITALS — Ht 65.0 in | Wt 131.0 lb

## 2020-12-15 DIAGNOSIS — G2111 Neuroleptic induced parkinsonism: Secondary | ICD-10-CM

## 2020-12-15 NOTE — Progress Notes (Signed)
Reason for visit: Secondary parkinsonism  Sylvia Maldonado is an 80 y.o. female  History of present illness:  Sylvia Maldonado is a 80 year old right-handed white female with a history of depression that appears to have some seasonal variation.  The patient has been on Abilify for a number of years, she has developed a mild secondary parkinsonism syndrome.  The patient has been very stable over the last 2 and half years, she reports no alteration in her memory or any alteration in her ability to function day-to-day.  She does report some occasional word finding problems.  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  The patient sleeps well at night, she currently believes that her depression is under good control.  She remains active, she works out at [employer], and she will walk about 10 miles each week.  She reports no gait instability or difficulty with falls.  Past Medical History:  Diagnosis Date  . Cataract   . Depression   . Diverticulosis   . Dry eyes   . Hx of adenomatous colonic polyps   . Internal hemorrhoids   . Parkinsonism ([place]) 07/19/2018   On Abilify    Past Surgical History:  Procedure Laterality Date  . cataracts surg    . COLONOSCOPY    . EYE SURGERY    . PELVIC FLOOR REPAIR    . TONSILLECTOMY    . VAGINAL HYSTERECTOMY      Family History  Problem Relation Age of Onset  . CAD Neg Hx     Social history:  reports that she has never smoked. She has never used smokeless tobacco. She reports that she does not drink alcohol and does not use drugs.    Allergies  Allergen Reactions  . Doxycycline Rash  . Neosporin [Neomycin-Bacitracin Zn-Polymyx] Swelling and Rash    Swelling and rash  . Penicillins Swelling and Rash    swelling and rash  . Sulfa Antibiotics Swelling and Rash  . Latex Rash    rash  . Prednisone Anxiety  . Acetaminophen   . Amprenavir     Other reaction(s): hives  . Aripiprazole     Other reaction(s):  loss of control of bowels  . Bupropion     Other reaction(s): anxiety  . Darunavir     Other reaction(s): hives  . Eggs Or Egg-Derived Products     Other reaction(s): as child had rash,  . Sulfur     Other reaction(s): swelling  . Venlafaxine     Other reaction(s): loss of bowel control  . Zoster Vac Recomb Adjuvanted     Other reaction(s): flulike symptoms, depressed    Medications:  Prior to Admission medications   Medication Sig Start Date End Date Taking? Authorizing Provider  ARIPiprazole (ABILIFY) 10 MG tablet 1  qam 11/05/20  Yes Plovsky, Berneta Sages, MD  augmented betamethasone dipropionate (DIPROLENE-AF) 0.05 % ointment APPLY TOPICALLY TO THE AFFECTED AREA TWICE DAILY FOR 14 DAYS 09/04/20  Yes [provider]  Cholecalciferol (VITAMIN D-3) 1000 UNITS CAPS Take 2,000 Units by mouth daily.   Yes [provider]  citalopram (CELEXA) 20 MG tablet Take 1 tablet (20 mg total) by mouth daily. 11/05/20  Yes Plovsky, Berneta Sages, MD  cycloSPORINE (RESTASIS) 0.05 % ophthalmic emulsion Place 1 drop into both eyes daily.    Yes [provider]  loteprednol (LOTEMAX) 0.5 % ophthalmic suspension 1 drop into affected eye   Yes [provider]  Magnesium 200 MG TABS 1 tablet with a meal   Yes [provider]  meloxicam (MOBIC) 7.5 MG tablet Take 7.5 mg by mouth daily as needed. 11/12/20  Yes [provider]  [facility] injection  06/11/20  Yes [provider]  traMADol (ULTRAM) 50 MG tablet TK 1 T PO Q 8 TO 12 H PRN 01/25/19  Yes [provider]  Turmeric 500 MG CAPS 1 capsule   Yes [provider]  vitamin B-12 (CYANOCOBALAMIN) 1000 MCG tablet Take 1,000 mcg by mouth daily.   Yes [provider]  aspirin 81 MG tablet Take 81 mg by mouth every other day.     [provider]    ROS:  Out of a complete 14 system review of symptoms, the patient complains only of the following symptoms, and all other reviewed  systems are negative.  Depression  Height 5\' 5"  (1.651 m), weight 131 lb (59.4 kg).  Physical Exam  General: The patient is alert and cooperative at the time of the examination.  Skin: No significant peripheral edema is noted.   Neurologic Exam  Mental status: The patient is alert and oriented x 3 at the time of the examination. The patient has apparent normal recent and remote memory, with an apparently normal attention span and concentration ability.   Cranial nerves: Facial symmetry is present. Speech is normal, no aphasia or dysarthria is noted. Extraocular movements are full. Visual fields are full.  Mild masking of the face is seen.  Motor: The patient has good strength in all 4 extremities.  Sensory examination: Soft touch sensation is symmetric on the face, arms, and legs.  Coordination: The patient has good finger-nose-finger and heel-to-shin bilaterally.  Gait and station: The patient has a normal gait. Tandem gait is normal. Romberg is negative. No drift is seen.  Reflexes: Deep tendon reflexes are symmetric.   Assessment/Plan:  1.  Mild secondary parkinsonism  The patient is doing fairly well at this point.  Her memory issues remain stable, her depression is well controlled.  She has had no discernible change in functional level over the last 2 and half years, I will have the patient self monitor at this point, and call us if she believes that she is developing any new issues.  Otherwise, she will be seen on an as-needed basis.  Jill Alexanders MD 12/15/2020 9:18 AM  [facility] [address] [place], [place] [address]  Phone [phone] Fax [phone]

## 2021-01-08 ENCOUNTER — Ambulatory Visit (INDEPENDENT_AMBULATORY_CARE_PROVIDER_SITE_OTHER): Payer: Medicare Other | Admitting: Psychology

## 2021-01-08 DIAGNOSIS — F331 Major depressive disorder, recurrent, moderate: Secondary | ICD-10-CM

## 2021-01-09 DIAGNOSIS — M7989 Other specified soft tissue disorders: Secondary | ICD-10-CM | POA: Diagnosis not present

## 2021-01-09 DIAGNOSIS — Z6821 Body mass index (BMI) 21.0-21.9, adult: Secondary | ICD-10-CM | POA: Diagnosis not present

## 2021-01-19 DIAGNOSIS — G2111 Neuroleptic induced parkinsonism: Secondary | ICD-10-CM | POA: Diagnosis not present

## 2021-01-19 DIAGNOSIS — F339 Major depressive disorder, recurrent, unspecified: Secondary | ICD-10-CM | POA: Diagnosis not present

## 2021-01-19 DIAGNOSIS — E7800 Pure hypercholesterolemia, unspecified: Secondary | ICD-10-CM | POA: Diagnosis not present

## 2021-01-22 ENCOUNTER — Ambulatory Visit (INDEPENDENT_AMBULATORY_CARE_PROVIDER_SITE_OTHER): Payer: Medicare Other | Admitting: Psychology

## 2021-01-22 DIAGNOSIS — F331 Major depressive disorder, recurrent, moderate: Secondary | ICD-10-CM

## 2021-02-05 ENCOUNTER — Ambulatory Visit (INDEPENDENT_AMBULATORY_CARE_PROVIDER_SITE_OTHER): Payer: Medicare Other | Admitting: Psychology

## 2021-02-05 DIAGNOSIS — F331 Major depressive disorder, recurrent, moderate: Secondary | ICD-10-CM

## 2021-02-06 ENCOUNTER — Ambulatory Visit: Payer: Medicare Other | Admitting: Podiatry

## 2021-02-10 ENCOUNTER — Telehealth (HOSPITAL_COMMUNITY): Payer: Medicare Other | Admitting: Psychiatry

## 2021-02-11 ENCOUNTER — Other Ambulatory Visit: Payer: Self-pay

## 2021-02-11 ENCOUNTER — Telehealth (INDEPENDENT_AMBULATORY_CARE_PROVIDER_SITE_OTHER): Payer: Medicare Other | Admitting: Psychiatry

## 2021-02-11 DIAGNOSIS — F325 Major depressive disorder, single episode, in full remission: Secondary | ICD-10-CM

## 2021-02-11 MED ORDER — ARIPIPRAZOLE 10 MG PO TABS: ORAL_TABLET | ORAL | 2 refills | Status: DC

## 2021-02-11 MED ORDER — CITALOPRAM HYDROBROMIDE 20 MG PO TABS: 20.0000 mg | ORAL_TABLET | Freq: Every day | ORAL | 2 refills | Status: DC

## 2021-02-11 NOTE — Progress Notes (Signed)
Patient ID: Sylvia Maldonado, female   DOB: 1941/03/16, 80 y.o.   MRN: [id number] [facility] MD Progress Note  02/11/2021 1:33 PM Sylvia Maldonado  MRN:  [id number] Subjective: I am feel awful; I feel depressed. Principal Problem: Major depression in remission Diagnosis: Major depression recurrent   Today the patient is at her baseline.  Her mood is good.  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  She does 20 hours and her client is doing okay.  The patient's children and grandchildren are also very stable.  The patient has joined the [facility] and goes 3 days a week.  She works the other 3 days a week and has 1 day off she says.  She sees her kids and they are doing well.  She is sleeping and eating well.  She is got good energy.  She has no problems thinking or concentrating.  She enjoys life.  She has a good sense of worth and value.  She drinks no alcohol and uses no drugs.  Patient has never had any psychotic symptoms.  Patient has never had any symptoms consistent with tardive dyskinesia.  Efforts in the past to reduce her Abilify have led to a distinct decline.  At this time I plan not to change her Abilify and perhaps never in the future.  She continues taking 20 mg of Celexa and 10 mg of Abilify without a problem.  She has no side effects.  She likes to walk.  She is got good energy.  This patient she will return to see me in 6 months.  She is functioning at a high level.  She is very independent. Past Surgical History:  Procedure Laterality Date   cataracts surg     COLONOSCOPY     EYE SURGERY     PELVIC FLOOR REPAIR     TONSILLECTOMY     VAGINAL HYSTERECTOMY     Family History:  Family History  Problem Relation Age of Onset   CAD Neg Hx    Family Psychiatric  History:  Social History:  Social History   Substance and Sexual Activity  Alcohol Use No     Social History   Substance and Sexual Activity  Drug Use No    Social History   Socioeconomic History   Marital status: Single     Spouse name: Not on file   Number of children: Not on file   Years of education: Not on file   Highest education level: Not on file  Occupational History   Not on file  Tobacco Use   Smoking status: Never   Smokeless tobacco: Never  Vaping Use   Vaping Use: Never used  Substance and Sexual Activity   Alcohol use: No   Drug use: No   Sexual activity: Not Currently  Other Topics Concern   Not on file  Social History Narrative   Right handed    Lives alone at home    No caffeine usage    Social Determinants of Health   Financial Resource Strain: Not on file  Food Insecurity: Not on file  Transportation Needs: Not on file  Physical Activity: Not on file  Stress: Not on file  Social Connections: Not on file   Additional Social History:                         Sleep: Good  Appetite:  Good  Current Medications: Current Outpatient Medications  Medication Sig Dispense  Refill   ARIPiprazole (ABILIFY) 10 MG tablet 1  qam 90 tablet 2   augmented betamethasone dipropionate (DIPROLENE-AF) 0.05 % ointment APPLY TOPICALLY TO THE AFFECTED AREA TWICE DAILY FOR 14 DAYS     Cholecalciferol (VITAMIN D-3) 1000 UNITS CAPS Take 2,000 Units by mouth daily.     citalopram (CELEXA) 20 MG tablet Take 1 tablet (20 mg total) by mouth daily. 90 tablet 2   cycloSPORINE (RESTASIS) 0.05 % ophthalmic emulsion Place 1 drop into both eyes daily.      loteprednol (LOTEMAX) 0.5 % ophthalmic suspension 1 drop into affected eye     Magnesium 200 MG TABS 1 tablet with a meal     meloxicam (MOBIC) 7.5 MG tablet Take 7.5 mg by mouth daily as needed.     SHINGRIX injection      traMADol (ULTRAM) 50 MG tablet TK 1 T PO Q 8 TO 12 H PRN     Turmeric 500 MG CAPS 1 capsule     vitamin B-12 (CYANOCOBALAMIN) 1000 MCG tablet Take 1,000 mcg by mouth daily.     No current facility-administered medications for this visit.    Lab Results: No results found for this or any previous visit (from the past 48  hour(s)).  Physical Findings: AIMS:  , ,  ,  ,    CIWA:    COWS:     Musculoskeletal: Strength & Muscle Tone: within normal limits Gait & Station: normal Patient leans: Right  Psychiatric Specialty Exam: ROS  There were no vitals taken for this visit.There is no height or weight on file to calculate BMI.  General Appearance: Casual  Eye Contact::  Good  Speech:  Clear and Coherent  Volume:  Normal  Mood:  NA  Affect:  Congruent  Thought Process:  Coherent  Orientation:  Full (Time, Place, and Person)  Thought Content:  WDL  Suicidal Thoughts:  No  Homicidal Thoughts:  No  Memory:  NA  Judgement:  NA  Insight:  Good  Psychomotor Activity:  Normal  Concentration:  Good  Recall:  Good  Fund of Knowledge:Good  Language: Good  Akathisia:  No  Handed:  Right  AIMS (if indicated):     Assets:  Desire for Improvement  ADL's:  Intact  Cognition: WNL  Sleep:       [name] 02/11/2021, 1:33 PM  This patient's first problem is that of major depression.  At this time is in remission.  She will continue taking Abilify 10 mg which she uses as adjunct treatment with her 20 mg of Celexa.  Efforts to reduce Abilify have led to decline.  Patient has never shown any evidence of tardive dyskinesia.  She denies any neurological symptoms at this time.  She is doing amazingly well.  She works she exercises and she is very positive.  We will speak again in 6 months ideally in person but I shared with her it is not safe we will call her again.

## 2021-02-16 ENCOUNTER — Other Ambulatory Visit: Payer: Self-pay

## 2021-02-16 ENCOUNTER — Encounter: Payer: Self-pay | Admitting: Podiatry

## 2021-02-16 ENCOUNTER — Ambulatory Visit (INDEPENDENT_AMBULATORY_CARE_PROVIDER_SITE_OTHER): Payer: Medicare Other | Admitting: Podiatry

## 2021-02-16 DIAGNOSIS — B351 Tinea unguium: Secondary | ICD-10-CM

## 2021-02-16 DIAGNOSIS — Z20822 Contact with and (suspected) exposure to covid-19: Secondary | ICD-10-CM | POA: Diagnosis not present

## 2021-02-16 DIAGNOSIS — M79674 Pain in right toe(s): Secondary | ICD-10-CM

## 2021-02-16 DIAGNOSIS — L84 Corns and callosities: Secondary | ICD-10-CM | POA: Diagnosis not present

## 2021-02-16 DIAGNOSIS — M216X1 Other acquired deformities of right foot: Secondary | ICD-10-CM

## 2021-02-16 DIAGNOSIS — M79675 Pain in left toe(s): Secondary | ICD-10-CM | POA: Diagnosis not present

## 2021-02-16 DIAGNOSIS — M216X2 Other acquired deformities of left foot: Secondary | ICD-10-CM

## 2021-02-16 NOTE — Progress Notes (Signed)
This patient returns to the office for evaluation and treatment of long thick painful nails .  This patient is unable to trim her own nails since the patient cannot reach her feet.  Patient says the nails are painful walking and wearing his shoes.  She returns for preventive foot care services.  General Appearance  Alert, conversant and in no acute stress.  Vascular  Dorsalis pedis and posterior tibial  pulses are palpable  bilaterally.  Capillary return is within normal limits  bilaterally. Temperature is within normal limits  bilaterally.  Neurologic  Senn-Weinstein monofilament wire test within normal limits  bilaterally. Muscle power within normal limits bilaterally.  Nails Thick disfigured discolored nails with subungual debris  from hallux to fifth toes bilaterally. No evidence of bacterial infection or drainage bilaterally.  Orthopedic  No limitations of motion  feet .  No crepitus or effusions noted.   HAV 1st MPJ  B/L.  Hammer toes 2-5  B/L. Plantar flexed fifth met  B/L.  Skin  normotropic skin with no porokeratosis noted bilaterally.  No signs of infections or ulcers noted.   Callus subfifth met  B/L.  Onychomycosis  Pain in toes right foot  Pain in toes left foot  Porokeratosis  B/L.  Debridement  of nails  1-5  B/L with a nail nipper.  Nails were then filed using a dremel tool with no incidents. Debride porokeratosis with # 15 blade  B/L. Patient requests additional treatment for her callus.  She was told to make an appointment for cut outs sub 5th  orthoses.  RTC 10 weeks    Gardiner Barefoot DPM

## 2021-02-19 ENCOUNTER — Ambulatory Visit (INDEPENDENT_AMBULATORY_CARE_PROVIDER_SITE_OTHER): Payer: Medicare Other | Admitting: Psychology

## 2021-02-19 DIAGNOSIS — F331 Major depressive disorder, recurrent, moderate: Secondary | ICD-10-CM

## 2021-02-25 DIAGNOSIS — R079 Chest pain, unspecified: Secondary | ICD-10-CM | POA: Diagnosis not present

## 2021-02-25 DIAGNOSIS — R42 Dizziness and giddiness: Secondary | ICD-10-CM | POA: Diagnosis not present

## 2021-03-05 ENCOUNTER — Emergency Department (HOSPITAL_BASED_OUTPATIENT_CLINIC_OR_DEPARTMENT_OTHER): Payer: Medicare Other | Admitting: Radiology

## 2021-03-05 ENCOUNTER — Ambulatory Visit (INDEPENDENT_AMBULATORY_CARE_PROVIDER_SITE_OTHER): Payer: Medicare Other | Admitting: Psychology

## 2021-03-05 ENCOUNTER — Emergency Department (HOSPITAL_BASED_OUTPATIENT_CLINIC_OR_DEPARTMENT_OTHER)
Admission: EM | Admit: 2021-03-05 | Discharge: 2021-03-05 | Disposition: A | Discharge status: Home / Self Care | Payer: Medicare Other | Attending: Emergency Medicine | Admitting: Emergency Medicine

## 2021-03-05 ENCOUNTER — Other Ambulatory Visit: Payer: Self-pay

## 2021-03-05 ENCOUNTER — Encounter (HOSPITAL_BASED_OUTPATIENT_CLINIC_OR_DEPARTMENT_OTHER): Payer: Self-pay | Admitting: Emergency Medicine

## 2021-03-05 DIAGNOSIS — F331 Major depressive disorder, recurrent, moderate: Secondary | ICD-10-CM

## 2021-03-05 DIAGNOSIS — R0789 Other chest pain: Secondary | ICD-10-CM | POA: Diagnosis not present

## 2021-03-05 DIAGNOSIS — R059 Cough, unspecified: Secondary | ICD-10-CM | POA: Diagnosis not present

## 2021-03-05 DIAGNOSIS — J449 Chronic obstructive pulmonary disease, unspecified: Secondary | ICD-10-CM | POA: Insufficient documentation

## 2021-03-05 DIAGNOSIS — Z91040 Latex allergy status: Secondary | ICD-10-CM | POA: Insufficient documentation

## 2021-03-05 DIAGNOSIS — R079 Chest pain, unspecified: Secondary | ICD-10-CM | POA: Diagnosis not present

## 2021-03-05 DIAGNOSIS — M542 Cervicalgia: Secondary | ICD-10-CM | POA: Diagnosis not present

## 2021-03-05 DIAGNOSIS — J439 Emphysema, unspecified: Secondary | ICD-10-CM | POA: Diagnosis not present

## 2021-03-05 LAB — BASIC METABOLIC PANEL
Anion gap: 8 (ref 5–15)
BUN: 14 mg/dL (ref 8–23)
CO2: 26 mmol/L (ref 22–32)
Calcium: 9.6 mg/dL (ref 8.9–10.3)
Chloride: 103 mmol/L (ref 98–111)
Creatinine, Ser: 0.53 mg/dL (ref 0.44–1.00)
GFR, Estimated: >60 mL/min (ref >60)
Glucose, Bld: 107 mg/dL — ABNORMAL HIGH (ref 70–99)
Potassium: 4.1 mmol/L (ref 3.5–5.1)
Sodium: 137 mmol/L (ref 135–145)

## 2021-03-05 LAB — CBC
HCT: 37.2 % (ref 36.0–46.0)
Hemoglobin: 12.6 g/dL (ref 12.0–15.0)
MCH: 29.6 pg (ref 26.0–34.0)
MCHC: 33.9 g/dL (ref 30.0–36.0)
MCV: 87.3 fL (ref 80.0–100.0)
Platelets: 193 10*3/uL (ref 150–400)
RBC: 4.26 MIL/uL (ref 3.87–5.11)
RDW: 13.4 % (ref 11.5–15.5)
WBC: 4.9 10*3/uL (ref 4.0–10.5)
nRBC: 0 % (ref 0.0–0.2)

## 2021-03-05 LAB — BRAIN NATRIURETIC PEPTIDE: B Natriuretic Peptide: 41.5 pg/mL (ref 0.0–100.0)

## 2021-03-05 LAB — MAGNESIUM: Magnesium: 2.3 mg/dL (ref 1.7–2.4)

## 2021-03-05 LAB — TROPONIN I (HIGH SENSITIVITY): Troponin I (High Sensitivity): 4 ng/L (ref <18)

## 2021-03-05 NOTE — ED Triage Notes (Signed)
Pt presents to ED POV. Pt c/o L CP x3w. Pt reports that today has gotten much more consistent. Pt reports that pain radiates to L neck.

## 2021-03-05 NOTE — ED Provider Notes (Signed)
[facility] EMERGENCY DEPT Provider Note   CSN: [id number] Arrival date & time: 03/05/21  1522     History Chief Complaint  Patient presents with   Chest Pain    Sylvia Maldonado is a 80 y.o. female.  Patient is a 80 yo female with pmh as listed below presenting for complaints of chest pain. Patient admits to left sided chest pain, radiating to her left neck, constant x 3 weeks. States shes presenting today bc pain is worsening. Denies fevers, chills, or sob. Admits to cough and states she does not typically have cough. Patient also states she followed with pcp 2 weeks ago for chest pain, had elevated heart markers, and was told to follow up outpatient with cardiologist. Pt has appointment scheduled for the future.   The history is provided by the patient. No language interpreter was used.  Chest Pain Pain location:  L chest Pain radiates to:  Neck Pain severity:  Moderate Duration:  3 weeks Timing:  Constant Associated symptoms: cough   Associated symptoms: no abdominal pain, no back pain, no fever, no palpitations, no shortness of breath and no vomiting       Past Medical History:  Diagnosis Date   Cataract    Depression    Diverticulosis    Dry eyes    Hx of adenomatous colonic polyps    Internal hemorrhoids    Parkinsonism ([place]) 07/19/2018   On Abilify    Patient Active Problem List   Diagnosis Date Noted   Plantar flexed metatarsal bone of left foot 02/16/2021   Plantar flexed metatarsal bone of right foot 02/16/2021   Degeneration of lumbar intervertebral disc 11/26/2020   Incontinence of feces 11/26/2020   Other specified disorders of bone density and structure, other site 11/26/2020   Personal history of malignant melanoma of skin 11/26/2020   Pure hypercholesterolemia 11/26/2020   Recurrent major depression ([place]) 11/26/2020   Unspecified abnormal finding in specimens from other organs, systems and tissues 11/26/2020   Vitamin D deficiency  11/26/2020   Vulvar intraepithelial neoplasia (VIN) grade 1 11/26/2020   Hammer toe, acquired 05/08/2019   Hav (hallux abducto valgus), unspecified laterality 05/08/2019   Pain due to onychomycosis of toenails of both feet 01/30/2019   Corns and callosities 01/30/2019   Parkinsonism ([place]) 07/19/2018   Depression, major, recurrent, moderate ([place]) 09/03/2014    Class: Chronic   Diarrhea 06/18/2014   Leukopenia 02/03/2013   History of depression 02/03/2013   Chest pain 02/02/2013   Changing skin lesion 09/12/2012    Past Surgical History:  Procedure Laterality Date   cataracts surg     COLONOSCOPY     EYE SURGERY     PELVIC FLOOR REPAIR     TONSILLECTOMY     VAGINAL HYSTERECTOMY       OB History     Gravida  3   Para  3   Term  3   Preterm      AB      Living  3      SAB      IAB      Ectopic      Multiple      Live Births              Family History  Problem Relation Age of Onset   CAD Neg Hx     Social History   Tobacco Use   Smoking status: Never   Smokeless tobacco: Never  Vaping Use  Vaping Use: Never used  Substance Use Topics   Alcohol use: No   Drug use: No    Home Medications Prior to Admission medications   Medication Sig Start Date End Date Taking? Authorizing Provider  ARIPiprazole (ABILIFY) 10 MG tablet 1  qam 02/11/21   Plovsky, Berneta Sages, MD  augmented betamethasone dipropionate (DIPROLENE-AF) 0.05 % ointment APPLY TOPICALLY TO THE AFFECTED AREA TWICE DAILY FOR 14 DAYS 09/04/20   [provider]  Cholecalciferol (VITAMIN D-3) 1000 UNITS CAPS Take 2,000 Units by mouth daily.    [provider]  citalopram (CELEXA) 20 MG tablet Take 1 tablet (20 mg total) by mouth daily. 02/11/21   Plovsky, Berneta Sages, MD  cycloSPORINE (RESTASIS) 0.05 % ophthalmic emulsion Place 1 drop into both eyes daily.     [provider]  loteprednol (LOTEMAX) 0.5 % ophthalmic suspension 1 drop into affected eye    [provider]  Magnesium 200 MG TABS 1 tablet with a meal    [provider]  meloxicam (MOBIC) 7.5 MG tablet Take 7.5 mg by mouth daily as needed. 11/12/20   [provider]  [facility] injection  06/11/20   [provider]  traMADol (ULTRAM) 50 MG tablet TK 1 T PO Q 8 TO 12 H PRN 01/25/19   [provider]  Turmeric 500 MG CAPS 1 capsule    [provider]  vitamin B-12 (CYANOCOBALAMIN) 1000 MCG tablet Take 1,000 mcg by mouth daily.    [provider]    Allergies    Doxycycline, Neosporin [neomycin-bacitracin zn-polymyx], Penicillins, Sulfa antibiotics, Latex, Prednisone, Acetaminophen, Amprenavir, Aripiprazole, Bupropion, Darunavir, Eggs or egg-derived products, Sulfur, Venlafaxine, and Zoster vac recomb adjuvanted  Review of Systems   Review of Systems  Constitutional:  Negative for chills and fever.  HENT:  Negative for ear pain and sore throat.   Eyes:  Negative for pain and visual disturbance.  Respiratory:  Positive for cough. Negative for shortness of breath.   Cardiovascular:  Positive for chest pain. Negative for palpitations.  Gastrointestinal:  Negative for abdominal pain and vomiting.  Genitourinary:  Negative for dysuria and hematuria.  Musculoskeletal:  Negative for arthralgias and back pain.  Skin:  Negative for color change and rash.  Neurological:  Negative for seizures and syncope.  All other systems reviewed and are negative.  Physical Exam Updated Vital Signs BP (!) 156/70   Pulse 76   Temp 98.6 F (37 C)   Resp 16   Ht '5\' 5"'$  (1.651 m)   Wt 54.9 kg   SpO2 98%   BMI 20.14 kg/m   Physical Exam Vitals and nursing note reviewed.  Constitutional:      General: She is not in acute distress.    Appearance: She is well-developed.  HENT:     Head: Normocephalic and atraumatic.  Eyes:     Conjunctiva/sclera: Conjunctivae normal.  Cardiovascular:     Rate and Rhythm: Normal rate and regular rhythm.     Heart sounds: No  murmur heard. Pulmonary:     Effort: Pulmonary effort is normal. No respiratory distress.     Breath sounds: Normal breath sounds.  Abdominal:     Palpations: Abdomen is soft.     Tenderness: There is no abdominal tenderness.  Musculoskeletal:     Cervical back: Neck supple.  Skin:    General: Skin is warm and dry.  Neurological:     Mental Status: She is alert.    ED Results / Procedures /  Treatments   Labs (all labs ordered are listed, but only abnormal results are displayed) Labs Reviewed  CBC  BASIC METABOLIC PANEL  BRAIN NATRIURETIC PEPTIDE  MAGNESIUM  TROPONIN I (HIGH SENSITIVITY)    EKG None  Radiology DG Chest 2 View  Result Date: 03/05/2021 CLINICAL DATA:  LEFT chest pain for 3 weeks worse today, pain radiating to LEFT neck, history Parkinson's EXAM: CHEST - 2 VIEW COMPARISON:  06/14/2017 Correlation: CT angio chest 06/27/2013 FINDINGS: Normal heart size, mediastinal contours, and pulmonary vascularity. Atherosclerotic calcification aorta. Emphysematous and minimal bronchitic changes consistent with COPD. No acute infiltrate, pleural effusion, or pneumothorax. Bones demineralized with chronic sclerosis of T4 vertebral body, also seen on prior CT on prior CT. IMPRESSION: COPD changes without acute abnormalities. Electronically Signed   By: Lavonia Dana M.D.   On: 03/05/2021 16:08    Procedures Procedures   Medications Ordered in ED Medications - No data to display  ED Course  I have reviewed the triage vital signs and the nursing notes.  Pertinent labs & imaging results that were available during my care of the patient were reviewed by me and considered in my medical decision making (see chart for details).    MDM Rules/Calculators/A&P                          4:30 PM 80 yo female with pmh as listed below presenting for complaints of chest pain. Patient is Aox3, no acute distress, afebrile, with stable vitals.   The patient's chest pain is not suggestive of  pulmonary embolus, cardiac ischemia, aortic dissection, pericarditis, myocarditis, pulmonary embolism, pneumothorax, pneumonia, Zoster, or esophageal perforation, or other serious etiology.  Historically not abrupt in onset, tearing or ripping, pulses symmetric. EKG nonspecific for ischemia/infarction. No dysrhythmias, brugada, WPW, prolonged QT noted. CXR reviewed and WNL. Troponin negative Labs without demonstration of acute pathology unless otherwise noted above.    Admission dicussed with patient due to age and risk factors and declined.  Patient in no distress and overall condition improved here in the ED. Detailed discussions were had with the patient regarding current findings, and need for close f/u with PCP or on call doctor. The patient has been instructed to return immediately if the symptoms worsen in any way for re-evaluation. Patient verbalized understanding and is in agreement with current care plan. All questions answered prior to discharge.      Final Clinical Impression(s) / ED Diagnoses Final diagnoses:  Chest pain, unspecified type    Rx / DC Orders ED Discharge Orders     None        Lianne Cure, DO [id number]

## 2021-03-05 NOTE — ED Notes (Signed)
Pt dc home with family. Pt stated understanding of dc instructions .

## 2021-03-18 ENCOUNTER — Encounter (HOSPITAL_BASED_OUTPATIENT_CLINIC_OR_DEPARTMENT_OTHER): Payer: Self-pay | Admitting: Cardiology

## 2021-03-18 ENCOUNTER — Other Ambulatory Visit: Payer: Self-pay

## 2021-03-18 ENCOUNTER — Ambulatory Visit (INDEPENDENT_AMBULATORY_CARE_PROVIDER_SITE_OTHER): Payer: Medicare Other | Admitting: Cardiology

## 2021-03-18 VITALS — BP 130/62 | HR 61 | Ht 65.0 in | Wt 124.0 lb

## 2021-03-18 DIAGNOSIS — R079 Chest pain, unspecified: Secondary | ICD-10-CM

## 2021-03-18 DIAGNOSIS — Z7189 Other specified counseling: Secondary | ICD-10-CM | POA: Diagnosis not present

## 2021-03-18 NOTE — Patient Instructions (Signed)

## 2021-03-18 NOTE — Progress Notes (Signed)
Cardiology Office Note:    Date:  03/18/2021   ID:  Sylvia Maldonado, DOB 07/04/1941, MRN [id number]  PCP:  Kathyrn Lass, MD  Cardiologist:  Buford Dresser, MD  Referring MD: Kathyrn Lass, MD   CC: new patient consultation for chest pain  History of Present Illness:    Sylvia Maldonado is a 80 y.o. female without prior cardiac history who is seen as a new consult at the request of Kathyrn Lass, MD for the evaluation and management of chest pain.  Notes from ER 03/05/21 and from Dr. Sabra Heck dated 02/25/21 reviewed.  Today: She thinks her chest pain worsened when she came off tramadol. She no longer has any chest pain, has been off the tramadol for about 5 weeks.   Chest pain: -Initial onset: about 5 weeks ago -Quality: like heart had a circle around it, sharp -Frequency/duration: constant -Associated symptoms: dizzy, no shortness of breath, nausea or diaphoresis.  -Aggravating/alleviating factors: none -Prior cardiac history: none -Prior workup: went to ER on 03/05/21, hsTn 4. -Cardiac ROS: no shortness of breath, no PND, no orthopnea, no LE edema, no syncope -Family history:  none that she knows of.   Past Medical History:  Diagnosis Date   Cataract    Depression    Diverticulosis    Dry eyes    Hx of adenomatous colonic polyps    Internal hemorrhoids    Parkinsonism ([place]) 07/19/2018   On Abilify    Past Surgical History:  Procedure Laterality Date   cataracts surg     COLONOSCOPY     EYE SURGERY     PELVIC FLOOR REPAIR     TONSILLECTOMY     VAGINAL HYSTERECTOMY      Current Medications: Current Outpatient Medications on File Prior to Visit  Medication Sig   ARIPiprazole (ABILIFY) 10 MG tablet 1  qam   augmented betamethasone dipropionate (DIPROLENE-AF) 0.05 % ointment APPLY TOPICALLY TO THE AFFECTED AREA TWICE DAILY FOR 14 DAYS   Cholecalciferol (VITAMIN D-3) 1000 UNITS CAPS Take 2,000 Units by mouth daily.   citalopram (CELEXA) 20 MG tablet Take 1  tablet (20 mg total) by mouth daily.   cycloSPORINE (RESTASIS) 0.05 % ophthalmic emulsion Place 1 drop into both eyes daily.    loteprednol (LOTEMAX) 0.5 % ophthalmic suspension 1 drop into affected eye   Magnesium 200 MG TABS 1 tablet with a meal   meloxicam (MOBIC) 7.5 MG tablet Take 7.5 mg by mouth daily as needed.   SHINGRIX injection    traMADol (ULTRAM) 50 MG tablet TK 1 T PO Q 8 TO 12 H PRN   Turmeric 500 MG CAPS 1 capsule   vitamin B-12 (CYANOCOBALAMIN) 1000 MCG tablet Take 1,000 mcg by mouth daily.   No current facility-administered medications on file prior to visit.     Allergies:   Doxycycline, Neosporin [neomycin-bacitracin zn-polymyx], Penicillins, Sulfa antibiotics, Latex, Prednisone, Acetaminophen, Amprenavir, Aripiprazole, Bupropion, Darunavir, Eggs or egg-derived products, Sulfur, Venlafaxine, and Zoster vac recomb adjuvanted   Social History   Tobacco Use   Smoking status: Never   Smokeless tobacco: Never  Vaping Use   Vaping Use: Never used  Substance Use Topics   Alcohol use: No   Drug use: No    Family History: family history is negative for CAD.  ROS:   Please see the history of present illness.  Additional pertinent ROS: Constitutional: Negative for chills, fever, night sweats, unintentional weight loss  HENT: Negative for ear pain and hearing loss.  Eyes: Negative for loss of vision and eye pain.  Respiratory: Negative for cough, sputum, wheezing.   Cardiovascular: See HPI. Gastrointestinal: Negative for abdominal pain, melena, and hematochezia.  Genitourinary: Negative for dysuria and hematuria.  Musculoskeletal: Negative for falls and myalgias.  Skin: Negative for itching and rash.  Neurological: Negative for focal weakness, focal sensory changes and loss of consciousness.  Endo/Heme/Allergies: Does not bruise/bleed easily.     EKGs/Labs/Other Studies Reviewed:    The following studies were reviewed today: [facility] 02-16-2013 - Left ventricle:  The cavity size was normal. Wall thickness    was increased in a pattern of mild LVH. Systolic function    was normal. The estimated ejection fraction was in the    range of 60% to 65%. Wall motion was normal; there were no    regional wall motion abnormalities. Left ventricular    diastolic function parameters were normal.  - Atrial septum: No defect or patent foramen ovale was    identified.   EKG:  EKG is personally reviewed.   03/18/21 NSR at 61 bpm  Recent Labs: 03/05/2021: B Natriuretic Peptide 41.5; BUN 14; Creatinine, Ser 0.53; Hemoglobin 12.6; Magnesium 2.3; Platelets 193; Potassium 4.1; Sodium 137  Recent Lipid Panel No results found for: CHOL, TRIG, HDL, CHOLHDL, VLDL, LDLCALC, LDLDIRECT  Physical Exam:    VS:  BP 130/62   Pulse 61   Ht '5\' 5"'$  (1.651 m)   Wt 124 lb (56.2 kg)   SpO2 96%   BMI 20.63 kg/m     Wt Readings from Last 3 Encounters:  03/18/21 124 lb (56.2 kg)  03/05/21 121 lb (54.9 kg)  12/15/20 131 lb (59.4 kg)    GEN: Well nourished, well developed in no acute distress HEENT: Normal, moist mucous membranes NECK: No JVD CARDIAC: regular rhythm, normal S1 and S2, no rubs or gallops. No murmur. VASCULAR: Radial and DP pulses 2+ bilaterally. No carotid bruits RESPIRATORY:  Clear to auscultation without rales, wheezing or rhonchi  ABDOMEN: Soft, non-tender, non-distended MUSCULOSKELETAL:  Ambulates independently SKIN: Warm and dry, no edema NEUROLOGIC:  Alert and oriented x 3. No focal neuro deficits noted. PSYCHIATRIC:  Normal affect    ASSESSMENT:    1. Chest pain, unspecified type   2. Cardiac risk counseling   3. Counseling on health promotion and disease prevention    PLAN:    Chest pain: -now resolved. Symptoms atypical for cardiac pain (constant, sharp, focal). Negative hsTn. No high risk factors for heart disease. No family history -instructed on signs/symptoms that need immediate medical attention -discussed options. After shared decision  making, will not pursue additional testing at this time.  Cardiac risk counseling and prevention recommendations: -recommend heart healthy/Mediterranean diet, with whole grains, fruits, vegetable, fish, lean meats, nuts, and olive oil. Limit salt. -recommend moderate walking, 3-5 times/week for 30-50 minutes each session. Aim for at least 150 minutes.week. Goal should be pace of 3 miles/hours, or walking 1.5 miles in 30 minutes -recommend avoidance of tobacco products. Avoid excess alcohol. -ASCVD risk score: The ASCVD Risk score Mikey Bussing DC Jr., et al., 2013) failed to calculate for the following reasons:   Cannot find a previous HDL lab   Cannot find a previous total cholesterol lab    Plan for follow up: I would be happy to see her back as needed  Buford Dresser, MD, PhD, [facility]    Medication Adjustments/Labs and Tests Ordered: Current medicines are reviewed at length with the patient today.  Concerns regarding medicines are outlined above.  Orders Placed This Encounter  Procedures   EKG 12-Lead   No orders of the defined types were placed in this encounter.   Patient Instructions  Medication Instructions:  Your Physician recommend you continue on your current medication as directed.    *If you need a refill on your cardiac medications before your next appointment, please call your pharmacy*   Lab Work: None ordered today   Testing/Procedures: None ordered today   Follow-Up: At [facility], you and your health needs are our priority.  As part of our continuing mission to provide you with exceptional heart care, we have created designated Provider Care Teams.  These Care Teams include your primary Cardiologist (physician) and Advanced Practice Providers (APPs -  Physician Assistants and Nurse Practitioners) who all work together to provide you with the care you need, when you need it.  We recommend signing up for the patient portal called  "MyChart".  Sign up information is provided on this After Visit Summary.  MyChart is used to connect with patients for Virtual Visits (Telemedicine).  Patients are able to view lab/test results, encounter notes, upcoming appointments, etc.  Non-urgent messages can be sent to your provider as well.   To learn more about what you can do with MyChart, go to [email or web address].    Your next appointment:   As needed   The format for your next appointment:   In Person  Provider:   Buford Dresser, MD    Signed, Buford Dresser, MD PhD 03/18/2021    [place]

## 2021-03-19 ENCOUNTER — Ambulatory Visit (INDEPENDENT_AMBULATORY_CARE_PROVIDER_SITE_OTHER): Payer: Medicare Other | Admitting: Psychology

## 2021-03-19 DIAGNOSIS — F331 Major depressive disorder, recurrent, moderate: Secondary | ICD-10-CM

## 2021-03-24 ENCOUNTER — Encounter (HOSPITAL_BASED_OUTPATIENT_CLINIC_OR_DEPARTMENT_OTHER): Payer: Self-pay | Admitting: Cardiology

## 2021-03-25 DIAGNOSIS — M5136 Other intervertebral disc degeneration, lumbar region: Secondary | ICD-10-CM | POA: Diagnosis not present

## 2021-03-25 DIAGNOSIS — Z23 Encounter for immunization: Secondary | ICD-10-CM | POA: Diagnosis not present

## 2021-03-25 DIAGNOSIS — M48061 Spinal stenosis, lumbar region without neurogenic claudication: Secondary | ICD-10-CM | POA: Diagnosis not present

## 2021-03-31 DIAGNOSIS — G2111 Neuroleptic induced parkinsonism: Secondary | ICD-10-CM | POA: Diagnosis not present

## 2021-03-31 DIAGNOSIS — F339 Major depressive disorder, recurrent, unspecified: Secondary | ICD-10-CM | POA: Diagnosis not present

## 2021-03-31 DIAGNOSIS — E7800 Pure hypercholesterolemia, unspecified: Secondary | ICD-10-CM | POA: Diagnosis not present

## 2021-04-02 ENCOUNTER — Ambulatory Visit (INDEPENDENT_AMBULATORY_CARE_PROVIDER_SITE_OTHER): Payer: Medicare Other | Admitting: Psychology

## 2021-04-02 DIAGNOSIS — M545 Low back pain, unspecified: Secondary | ICD-10-CM | POA: Diagnosis not present

## 2021-04-02 DIAGNOSIS — F331 Major depressive disorder, recurrent, moderate: Secondary | ICD-10-CM | POA: Diagnosis not present

## 2021-04-08 DIAGNOSIS — M48061 Spinal stenosis, lumbar region without neurogenic claudication: Secondary | ICD-10-CM | POA: Diagnosis not present

## 2021-04-08 DIAGNOSIS — M545 Low back pain, unspecified: Secondary | ICD-10-CM | POA: Diagnosis not present

## 2021-04-15 ENCOUNTER — Other Ambulatory Visit: Payer: Self-pay

## 2021-04-15 ENCOUNTER — Ambulatory Visit (HOSPITAL_BASED_OUTPATIENT_CLINIC_OR_DEPARTMENT_OTHER): Payer: Medicare Other | Attending: Sports Medicine | Admitting: Physical Therapy

## 2021-04-15 ENCOUNTER — Encounter (HOSPITAL_BASED_OUTPATIENT_CLINIC_OR_DEPARTMENT_OTHER): Payer: Self-pay | Admitting: Physical Therapy

## 2021-04-15 DIAGNOSIS — M545 Low back pain, unspecified: Secondary | ICD-10-CM | POA: Diagnosis not present

## 2021-04-15 DIAGNOSIS — R262 Difficulty in walking, not elsewhere classified: Secondary | ICD-10-CM | POA: Diagnosis not present

## 2021-04-15 DIAGNOSIS — R2689 Other abnormalities of gait and mobility: Secondary | ICD-10-CM | POA: Diagnosis not present

## 2021-04-15 DIAGNOSIS — M6281 Muscle weakness (generalized): Secondary | ICD-10-CM | POA: Diagnosis not present
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
## 2021-04-16 ENCOUNTER — Ambulatory Visit (INDEPENDENT_AMBULATORY_CARE_PROVIDER_SITE_OTHER): Payer: Medicare Other | Admitting: Psychology

## 2021-04-16 DIAGNOSIS — F331 Major depressive disorder, recurrent, moderate: Secondary | ICD-10-CM

## 2021-04-20 ENCOUNTER — Ambulatory Visit (HOSPITAL_BASED_OUTPATIENT_CLINIC_OR_DEPARTMENT_OTHER): Payer: Medicare Other | Admitting: Physical Therapy

## 2021-04-22 ENCOUNTER — Encounter (HOSPITAL_BASED_OUTPATIENT_CLINIC_OR_DEPARTMENT_OTHER): Payer: Self-pay | Admitting: Physical Therapy

## 2021-04-22 ENCOUNTER — Other Ambulatory Visit: Payer: Self-pay

## 2021-04-22 ENCOUNTER — Ambulatory Visit (HOSPITAL_BASED_OUTPATIENT_CLINIC_OR_DEPARTMENT_OTHER): Payer: Medicare Other | Admitting: Physical Therapy

## 2021-04-22 DIAGNOSIS — M545 Low back pain, unspecified: Secondary | ICD-10-CM | POA: Diagnosis not present

## 2021-04-22 DIAGNOSIS — M6281 Muscle weakness (generalized): Secondary | ICD-10-CM

## 2021-04-22 DIAGNOSIS — R197 Diarrhea, unspecified: Secondary | ICD-10-CM | POA: Diagnosis not present

## 2021-04-22 DIAGNOSIS — R262 Difficulty in walking, not elsewhere classified: Secondary | ICD-10-CM | POA: Diagnosis not present

## 2021-04-22 DIAGNOSIS — R2689 Other abnormalities of gait and mobility: Secondary | ICD-10-CM

## 2021-04-22 NOTE — Therapy (Signed)
OUTPATIENT PHYSICAL THERAPY TREATMENT NOTE   Patient Name: Sylvia Maldonado MRN: [id number] DOB:1941-05-02, 80 y.o., female Today's Date: 04/23/2021  PCP: Kathyrn Lass, MD REFERRING PROVIDER: Kathyrn Lass, MD   PT End of Session - 04/22/21 1540     Visit Number 2    Number of Visits 17    Date for PT Re-Evaluation 07/14/21    Authorization Type Medicare    PT Start Time 1545    PT Stop Time 1632    PT Time Calculation (min) 47 min    Activity Tolerance Patient tolerated treatment well    Behavior During Therapy [facility] for tasks assessed/performed             Past Medical History:  Diagnosis Date   Cataract    Depression    Diverticulosis    Dry eyes    Hx of adenomatous colonic polyps    Internal hemorrhoids    Parkinsonism ([place]) 07/19/2018   On Abilify   Past Surgical History:  Procedure Laterality Date   cataracts surg     COLONOSCOPY     EYE SURGERY     PELVIC FLOOR REPAIR     TONSILLECTOMY     VAGINAL HYSTERECTOMY     Patient Active Problem List   Diagnosis Date Noted   Plantar flexed metatarsal bone of left foot 02/16/2021   Plantar flexed metatarsal bone of right foot 02/16/2021   Degeneration of lumbar intervertebral disc 11/26/2020   Incontinence of feces 11/26/2020   Other specified disorders of bone density and structure, other site 11/26/2020   Personal history of malignant melanoma of skin 11/26/2020   Pure hypercholesterolemia 11/26/2020   Recurrent major depression ([place]) 11/26/2020   Unspecified abnormal finding in specimens from other organs, systems and tissues 11/26/2020   Vitamin D deficiency 11/26/2020   Vulvar intraepithelial neoplasia (VIN) grade 1 11/26/2020   Hammer toe, acquired 05/08/2019   Hav (hallux abducto valgus), unspecified laterality 05/08/2019   Pain due to onychomycosis of toenails of both feet 01/30/2019   Corns and callosities 01/30/2019   Parkinsonism ([place]) 07/19/2018   Depression, major, recurrent, moderate ([place])  09/03/2014    Class: Chronic   Diarrhea 06/18/2014   Leukopenia 02/03/2013   History of depression 02/03/2013   Chest pain 02/02/2013   Changing skin lesion 09/12/2012    REFERRING DIAG: Lumbar spondylosis Peripheral Neuropathy   THERAPY DIAG:  Pain, lumbar region  Muscle weakness (generalized)  Difficulty walking  Balance disorder    SUBJECTIVE: No pain today. "I have been doing the eercises I was given and it has helped with the pain."  PAIN:   Are you having pain? No Worst 5/10, Best 0/10 Pain location: L lumbar Pain orientation: Left  PAIN TYPE: sharp, it just "hurts"  Pain description: intermittent  Aggravating factors: sitting too long, lying on L side while sleeping,  Relieving factors: walking/movement    OBJECTIVE: PERTINENT HISTORY:  Peripheral neuropathy      PRECAUTIONS: None   WEIGHT BEARING RESTRICTIONS No   FALLS:  Has patient fallen in last 6 months? No,   LIVING ENVIRONMENT: Lives with: lives with their family and lives alone Lives in: apartment Stairs: Yes; External: 6 steps; Rail on both sides going up Has following equipment at home: None   PLOF: Independent   PATIENT GOALS Pt states she would like to get rid of the pain, stay shape, preventing falls. Pt states her biggest challenge is balance.      OBJECTIVE:  DIAGNOSTIC FINDINGS:  No imaging found for L/S in chart review   PATIENT SURVEYS:  Modified Oswestry 18 / 50 or 36 %    SCREENING FOR RED FLAGS: Bowel or bladder incontinence: No Spinal tumors: No Cauda equina syndrome: No Compression fracture: No     COGNITION:          Overall cognitive status: Within functional limits for tasks assessed                        SENSATION:          Light touch: Appears intact          HOME EXERCISE PROGRAM: Access Code: [email or web address]       ASSESSMENT:   CLINICAL IMPRESSION: Pt acclimated to environment without difficulty.  She is given extra time for warming up/water  walking to gain understanding of effects of water (buoyancy), balance adjustment and stretching as pt with tightness throughout LE.  She completed all prescribed activities well with the exception of a cramp in right gastroc and left LB with hip flex and quad stretching in standing.  Pt instructed on proper counter positions to ease.  She will benefit from aquatic therapy as she will be more effective in completing program with decreased fear of falling and discomfort.   REHAB POTENTIAL: Good   CLINICAL DECISION MAKING: Stable/uncomplicated   EVALUATION COMPLEXITY: Low     GOALS:   SHORT TERM GOALS:   STG Name Target Date Goal status  1 Pt will become independent with HEP in order to demonstrate synthesis of PT education.   Baseline:  04/29/2021 INITIAL  2 Pt will demonstrate at least a 12.8 improvement in Oswestry Index in order to demonstrate a clinically significant change in LBP and function.   Baseline:  05/13/2021   INITIAL  3 Pt will be able to demonstrate ability to perform SLS for >10s on each limb in order to demonstrate functional improvement in balance for self-care and house hold duties.   Baseline: 05/13/2021 INITIAL  4 Pt will report at least 2 pt reduction on VAS scale for pain in order to demonstrate functional improvement with household activity, self care, and ADL.     Baseline: 05/13/2021 INITIAL    LONG TERM GOALS:    LTG Name Target Date Goal status  1 Pt  will become independent with final HEP in order to demonstrate synthesis of PT education.   06/10/2021 INITIAL  2 Pt will be able to demonstrate/report ability to sit/stand/sleep for extended periods of time without pain in order to demonstrate functional improvement and tolerance to static positioning.   06/10/2021 INITIAL  3 Pt will demonstrate at least a 25.6 improvement in Oswestry Index in order to demonstrate a clinically significant change in LBP and function.     06/10/2021 INITIAL  4 Pt will be  able to perform 5XSTS in under 12s  in order to demonstrate functional improvement above the cut off score for adults.       06/10/2021 INITIAL    PLAN: PT FREQUENCY: 2x/week   PT DURATION: 8 weeks   PLANNED INTERVENTIONS: Therapeutic exercises, Therapeutic activity, Neuro Muscular re-education, Balance training, Gait training, Patient/Family education, Joint mobilization, Stair training, Orthotic/Fit training, Aquatic Therapy, Dry Needling, Electrical stimulation, Spinal mobilization, Cryotherapy, Moist heat, Taping, Vasopneumatic device, Traction, Ultrasound, Ionotophoresis 4mg /ml Dexamethasone, and Manual therapy       TODAY'S TREATMENT: Pt seen for aquatic therapy today.  Treatment took place in water  3.25-4.8 ft in depth at the [facility] pool. Temp of water was 91.  Pt entered/exited the pool via stairs step to pattern independently with bilat rail. Warm up: forward, backward and side stepping/walking cues for increased step length, increased speed, hand placement to increase resistance.  Pt entered water for aquatic therapy for first time and was introduced to principles and therapeutic effects of water as she ambulated and acclimated to pool. Pt ambulated forward, backwards and side stepping engaging posterior and anterior chain.  4-6 lengths each cues for increased step length and speed for increasing resistance.  Seated Stretching Gastroc, hamstring, and adductor 3 x 30 second hold -knee flex/ext 2 x 10 reps VC for increasing speed as able  Standing Using ankle buoys and holding to pool wall -hip add/abd 2 x 10 -hip extension 2 x 10  -marching x 10 -hip flex2 x 10 Hip flex stretch with noodle R/L 3 x 30 sec. Right gastroc cramp -knee to wall hip flex strengthening x 10 R/L     Pt requires buoyancy for support and to offload joints with strengthening exercises. Viscosity of the water is needed for resistance of strengthening; water current perturbations  provides challenge to standing balance unsupported, requiring increased core activation.    PATIENT EDUCATION:  Water properties, benefits of aquatic therapy.  PLAN FOR NEXT SESSION: introduce to aquatic therapy, lumbar stretching, hip stretching, intro balance Stanton Kidney ([place]) Queena Monrreal MPT  04/23/2021, 1:26 PM

## 2021-04-29 ENCOUNTER — Other Ambulatory Visit: Payer: Self-pay

## 2021-04-29 ENCOUNTER — Encounter (HOSPITAL_BASED_OUTPATIENT_CLINIC_OR_DEPARTMENT_OTHER): Payer: Self-pay | Admitting: Physical Therapy

## 2021-04-29 ENCOUNTER — Ambulatory Visit (HOSPITAL_BASED_OUTPATIENT_CLINIC_OR_DEPARTMENT_OTHER): Payer: Medicare Other | Admitting: Physical Therapy

## 2021-04-29 DIAGNOSIS — M6281 Muscle weakness (generalized): Secondary | ICD-10-CM

## 2021-04-29 DIAGNOSIS — R262 Difficulty in walking, not elsewhere classified: Secondary | ICD-10-CM

## 2021-04-29 DIAGNOSIS — R2689 Other abnormalities of gait and mobility: Secondary | ICD-10-CM

## 2021-04-29 DIAGNOSIS — R293 Abnormal posture: Secondary | ICD-10-CM

## 2021-04-29 DIAGNOSIS — R2681 Unsteadiness on feet: Secondary | ICD-10-CM

## 2021-04-29 DIAGNOSIS — Z23 Encounter for immunization: Secondary | ICD-10-CM | POA: Diagnosis not present

## 2021-04-29 DIAGNOSIS — M545 Low back pain, unspecified: Secondary | ICD-10-CM | POA: Diagnosis not present

## 2021-04-29 DIAGNOSIS — G8929 Other chronic pain: Secondary | ICD-10-CM

## 2021-04-29 NOTE — Therapy (Signed)
OUTPATIENT PHYSICAL THERAPY TREATMENT NOTE   Patient Name: Jacklyn Branan MRN: [id number] DOB:01/14/1941, 80 y.o., female Today's Date: 04/29/2021  PCP: Kathyrn Lass, MD REFERRING PROVIDER: Kathyrn Lass, MD   PT End of Session - 04/29/21 0949     Visit Number 3    Number of Visits 17    Date for PT Re-Evaluation 07/14/21    Authorization Type Medicare    PT Start Time 0949    PT Stop Time 31    PT Time Calculation (min) 41 min    Equipment Utilized During Treatment Other (comment)    Activity Tolerance Patient tolerated treatment well    Behavior During Therapy [facility] for tasks assessed/performed             Past Medical History:  Diagnosis Date   Cataract    Depression    Diverticulosis    Dry eyes    Hx of adenomatous colonic polyps    Internal hemorrhoids    Parkinsonism ([place]) 07/19/2018   On Abilify   Past Surgical History:  Procedure Laterality Date   cataracts surg     COLONOSCOPY     EYE SURGERY     PELVIC FLOOR REPAIR     TONSILLECTOMY     VAGINAL HYSTERECTOMY     Patient Active Problem List   Diagnosis Date Noted   Plantar flexed metatarsal bone of left foot 02/16/2021   Plantar flexed metatarsal bone of right foot 02/16/2021   Degeneration of lumbar intervertebral disc 11/26/2020   Incontinence of feces 11/26/2020   Other specified disorders of bone density and structure, other site 11/26/2020   Personal history of malignant melanoma of skin 11/26/2020   Pure hypercholesterolemia 11/26/2020   Recurrent major depression ([place]) 11/26/2020   Unspecified abnormal finding in specimens from other organs, systems and tissues 11/26/2020   Vitamin D deficiency 11/26/2020   Vulvar intraepithelial neoplasia (VIN) grade 1 11/26/2020   Hammer toe, acquired 05/08/2019   Hav (hallux abducto valgus), unspecified laterality 05/08/2019   Pain due to onychomycosis of toenails of both feet 01/30/2019   Corns and callosities 01/30/2019   Parkinsonism ([place])  07/19/2018   Depression, major, recurrent, moderate ([place]) 09/03/2014    Class: Chronic   Diarrhea 06/18/2014   Leukopenia 02/03/2013   History of depression 02/03/2013   Chest pain 02/02/2013   Changing skin lesion 09/12/2012    REFERRING DIAG: Lumbar spondylosis Peripheral Neuropathy   THERAPY DIAG:  Abnormal posture  Chronic pain of right knee  Difficulty in walking, not elsewhere classified  Muscle weakness (generalized)  Unsteadiness on feet  Other abnormalities of gait and mobility    SUBJECTIVE: No pain today. "  PAIN:   Are you having pain? No Worst 5/10, Best 0/10 Pain location: L lumbar Pain orientation: Left  PAIN TYPE: sharp, it just "hurts"  Pain description: intermittent  Aggravating factors: sitting too long, lying on L side while sleeping,  Relieving factors: walking/movement    OBJECTIVE: PERTINENT HISTORY:  Peripheral neuropathy      PRECAUTIONS: None   WEIGHT BEARING RESTRICTIONS No   FALLS:  Has patient fallen in last 6 months? No,   LIVING ENVIRONMENT: Lives with: lives with their family and lives alone Lives in: apartment Stairs: Yes; External: 6 steps; Rail on both sides going up Has following equipment at home: None   PLOF: Independent   PATIENT GOALS Pt states she would like to get rid of the pain, stay shape, preventing falls. Pt states her biggest challenge  is balance.      OBJECTIVE:    DIAGNOSTIC FINDINGS:  No imaging found for L/S in chart review   PATIENT SURVEYS:  Modified Oswestry 18 / 50 or 36 %    SCREENING FOR RED FLAGS: Bowel or bladder incontinence: No Spinal tumors: No Cauda equina syndrome: No Compression fracture: No     COGNITION:          Overall cognitive status: Within functional limits for tasks assessed                        SENSATION:          Light touch: Appears intact          HOME EXERCISE PROGRAM: Access Code: [email or web address]       ASSESSMENT:   CLINICAL IMPRESSION: Added  core strengthening exercises today.  VC for tightened core throughout. Instruction on hip hinges and proper technique for STS. Pt completes STS with good execution from 3rd water step shifting weight properly.  She engages in all exercises without increased discomfort and good toleration.   REHAB POTENTIAL: Good   CLINICAL DECISION MAKING: Stable/uncomplicated   EVALUATION COMPLEXITY: Low     GOALS:   SHORT TERM GOALS:   STG Name Target Date Goal status  1 Pt will become independent with HEP in order to demonstrate synthesis of PT education.   Baseline:  04/29/2021 INITIAL  2 Pt will demonstrate at least a 12.8 improvement in Oswestry Index in order to demonstrate a clinically significant change in LBP and function.   Baseline:  05/13/2021   INITIAL  3 Pt will be able to demonstrate ability to perform SLS for >10s on each limb in order to demonstrate functional improvement in balance for self-care and house hold duties.   Baseline: 05/13/2021 INITIAL  4 Pt will report at least 2 pt reduction on VAS scale for pain in order to demonstrate functional improvement with household activity, self care, and ADL.     Baseline: 05/13/2021 INITIAL    LONG TERM GOALS:    LTG Name Target Date Goal status  1 Pt  will become independent with final HEP in order to demonstrate synthesis of PT education.   06/10/2021 INITIAL  2 Pt will be able to demonstrate/report ability to sit/stand/sleep for extended periods of time without pain in order to demonstrate functional improvement and tolerance to static positioning.   06/10/2021 INITIAL  3 Pt will demonstrate at least a 25.6 improvement in Oswestry Index in order to demonstrate a clinically significant change in LBP and function.     06/10/2021 INITIAL  4 Pt will be able to perform 5XSTS in under 12s  in order to demonstrate functional improvement above the cut off score for adults.       06/10/2021 INITIAL    PLAN: PT FREQUENCY: 2x/week   PT  DURATION: 8 weeks   PLANNED INTERVENTIONS: Therapeutic exercises, Therapeutic activity, Neuro Muscular re-education, Balance training, Gait training, Patient/Family education, Joint mobilization, Stair training, Orthotic/Fit training, Aquatic Therapy, Dry Needling, Electrical stimulation, Spinal mobilization, Cryotherapy, Moist heat, Taping, Vasopneumatic device, Traction, Ultrasound, Ionotophoresis 4mg /ml Dexamethasone, and Manual therapy       TODAY'S TREATMENT:   04/29/21 Pt seen for aquatic therapy today.  Treatment took place in water 3.25-4.8 ft in depth at the [facility] pool. Temp of water was 95.  Pt entered/exited the pool via stairs step to pattern independently with bilat rail. Warm up: forward, backward and side  stepping/walking cues for increased step length, increased speed, hand placement to increase resistance.  Pt entered water for aquatic therapy for first time and was introduced to principles and therapeutic effects of water as she ambulated and acclimated to pool. Pt ambulated forward, backwards and side stepping engaging posterior and anterior chain.  4-6 lengths each cues for increased step length and speed for increasing resistance.  Seated Stretching Gastroc, hamstring, and adductor 3 x 30 second hold -knee flex/ext 2 x 10 reps VC for increasing speed as able  Standing Using kick board hip hinges 2x10 -lateral core rotation 2 x 10 -kick board push downs 2 x 15 water perturbations. VC for tightened core. -STS from 3rd water step wihtout ue assist 2 x 10.  VC and demo for proper execution Using ankle buoys and holding to pool wall -hip add/abd 2 x 10 -hip extension 2 x 10  -marching 2x 10 -hip flex2 x 10   Water walking 2 widths between most exercises for recovery and stretching forward/backward and sidestepping.  04/22/21   Pt requires buoyancy for support and to offload joints with strengthening exercises. Viscosity of the water is needed for  resistance of strengthening; water current perturbations provides challenge to standing balance unsupported, requiring increased core activation.  Pt seen for aquatic therapy today.  Treatment took place in water 3.25-4.8 ft in depth at the [facility] pool. Temp of water was 91.  Pt entered/exited the pool via stairs step to pattern independently with bilat rail. Warm up: forward, backward and side stepping/walking cues for increased step length, increased speed, hand placement to increase resistance.  Pt entered water for aquatic therapy for first time and was introduced to principles and therapeutic effects of water as she ambulated and acclimated to pool. Pt ambulated forward, backwards and side stepping engaging posterior and anterior chain.  4-6 lengths each cues for increased step length and speed for increasing resistance.  Seated Stretching Gastroc, hamstring, and adductor 3 x 30 second hold -knee flex/ext 2 x 10 reps VC for increasing speed as able  Standing Using ankle buoys and holding to pool wall -hip add/abd 2 x 10 VC and demonstration for technique. Pt tends to hip flex lle. -marching 2x 10 Hip flex stretch with noodle R/L 3 x 30 sec. Right gastroc cramp -knee to wall hip flex strengthening x 10 R/L     Pt requires buoyancy for support and to offload joints with strengthening exercises. Viscosity of the water is needed for resistance of strengthening; water current perturbations provides challenge to standing balance unsupported, requiring increased core activation.    PATIENT EDUCATION:  Water properties, benefits of aquatic therapy.  PLAN FOR NEXT SESSION: hip extensor stretch R. Lenoir Facchini ([place]) Rohil Lesch MPT  04/29/2021, 2:05 PM

## 2021-05-01 ENCOUNTER — Ambulatory Visit (HOSPITAL_BASED_OUTPATIENT_CLINIC_OR_DEPARTMENT_OTHER): Payer: Medicare Other | Admitting: Physical Therapy

## 2021-05-01 ENCOUNTER — Other Ambulatory Visit: Payer: Self-pay

## 2021-05-01 ENCOUNTER — Encounter (HOSPITAL_BASED_OUTPATIENT_CLINIC_OR_DEPARTMENT_OTHER): Payer: Self-pay | Admitting: Physical Therapy

## 2021-05-01 DIAGNOSIS — M6281 Muscle weakness (generalized): Secondary | ICD-10-CM

## 2021-05-01 DIAGNOSIS — G8929 Other chronic pain: Secondary | ICD-10-CM

## 2021-05-01 DIAGNOSIS — R2689 Other abnormalities of gait and mobility: Secondary | ICD-10-CM | POA: Diagnosis not present

## 2021-05-01 DIAGNOSIS — R262 Difficulty in walking, not elsewhere classified: Secondary | ICD-10-CM

## 2021-05-01 DIAGNOSIS — R2681 Unsteadiness on feet: Secondary | ICD-10-CM

## 2021-05-01 DIAGNOSIS — R293 Abnormal posture: Secondary | ICD-10-CM

## 2021-05-01 DIAGNOSIS — M545 Low back pain, unspecified: Secondary | ICD-10-CM | POA: Diagnosis not present

## 2021-05-01 DIAGNOSIS — M25561 Pain in right knee: Secondary | ICD-10-CM

## 2021-05-01 NOTE — Therapy (Signed)
OUTPATIENT PHYSICAL THERAPY TREATMENT NOTE   Patient Name: Sylvia Maldonado MRN: [id number] DOB:[birth year], 80 y.o., female Today's Date: 05/01/2021  PCP: Kathyrn Lass, MD REFERRING PROVIDER: Kathyrn Lass, MD   PT End of Session - 05/01/21 1038     Visit Number 4    Number of Visits 17    Date for PT Re-Evaluation 07/14/21    Authorization Type Medicare    PT Start Time 1034    Equipment Utilized During Treatment Other (comment)    Activity Tolerance Patient tolerated treatment well    Behavior During Therapy [facility] for tasks assessed/performed             Past Medical History:  Diagnosis Date   Cataract    Depression    Diverticulosis    Dry eyes    Hx of adenomatous colonic polyps    Internal hemorrhoids    Parkinsonism ([place]) 07/19/2018   On Abilify   Past Surgical History:  Procedure Laterality Date   cataracts surg     COLONOSCOPY     EYE SURGERY     PELVIC FLOOR REPAIR     TONSILLECTOMY     VAGINAL HYSTERECTOMY     Patient Active Problem List   Diagnosis Date Noted   Plantar flexed metatarsal bone of left foot 02/16/2021   Plantar flexed metatarsal bone of right foot 02/16/2021   Degeneration of lumbar intervertebral disc 11/26/2020   Incontinence of feces 11/26/2020   Other specified disorders of bone density and structure, other site 11/26/2020   Personal history of malignant melanoma of skin 11/26/2020   Pure hypercholesterolemia 11/26/2020   Recurrent major depression ([place]) 11/26/2020   Unspecified abnormal finding in specimens from other organs, systems and tissues 11/26/2020   Vitamin D deficiency 11/26/2020   Vulvar intraepithelial neoplasia (VIN) grade 1 11/26/2020   Hammer toe, acquired 05/08/2019   Hav (hallux abducto valgus), unspecified laterality 05/08/2019   Pain due to onychomycosis of toenails of both feet 01/30/2019   Corns and callosities 01/30/2019   Parkinsonism ([place]) 07/19/2018   Depression, major, recurrent, moderate ([place])  09/03/2014    Class: Chronic   Diarrhea 06/18/2014   Leukopenia 02/03/2013   History of depression 02/03/2013   Chest pain 02/02/2013   Changing skin lesion 09/12/2012    REFERRING DIAG: Lumbar spondylosis Peripheral Neuropathy   THERAPY DIAG:  No diagnosis found.    SUBJECTIVE: No pain today.  No questions or concerns  PAIN:   Are you having pain? No Worst 5/10, Best 0/10 Pain location: L lumbar Pain orientation: Left  PAIN TYPE: sharp, it just "hurts"  Pain description: intermittent  Aggravating factors: sitting too long, lying on L side while sleeping,  Relieving factors: walking/movement    OBJECTIVE: PERTINENT HISTORY:  Peripheral neuropathy      PRECAUTIONS: None   WEIGHT BEARING RESTRICTIONS No   FALLS:  Has patient fallen in last 6 months? No,   LIVING ENVIRONMENT: Lives with: lives with their family and lives alone Lives in: apartment Stairs: Yes; External: 6 steps; Rail on both sides going up Has following equipment at home: None   PLOF: Independent   PATIENT GOALS Pt states she would like to get rid of the pain, stay shape, preventing falls. Pt states her biggest challenge is balance.      OBJECTIVE:    DIAGNOSTIC FINDINGS:  No imaging found for L/S in chart review   PATIENT SURVEYS:  Modified Oswestry 18 / 50 or 36 %    SCREENING  FOR RED FLAGS: Bowel or bladder incontinence: No Spinal tumors: No Cauda equina syndrome: No Compression fracture: No     COGNITION:          Overall cognitive status: Within functional limits for tasks assessed                        SENSATION:          Light touch: Appears intact          HOME EXERCISE PROGRAM: Access Code: [email or web address]       ASSESSMENT:   CLINICAL IMPRESSION: Progressed core strengthening. Pt with noted difficulty with backward movements, maintaining balance. Weight shifting with STS and stair climbing improved no LOB.  Pt completes all activities without discomfort, reporting  no LBP.   REHAB POTENTIAL: Good   CLINICAL DECISION MAKING: Stable/uncomplicated   EVALUATION COMPLEXITY: Low     GOALS:   SHORT TERM GOALS:   STG Name Target Date Goal status  1 Pt will become independent with HEP in order to demonstrate synthesis of PT education.   Baseline:  04/29/2021 INITIAL  2 Pt will demonstrate at least a 12.8 improvement in Oswestry Index in order to demonstrate a clinically significant change in LBP and function.   Baseline:  05/13/2021   INITIAL  3 Pt will be able to demonstrate ability to perform SLS for >10s on each limb in order to demonstrate functional improvement in balance for self-care and house hold duties.   Baseline: 05/13/2021 INITIAL  4 Pt will report at least 2 pt reduction on VAS scale for pain in order to demonstrate functional improvement with household activity, self care, and ADL.     Baseline: 05/13/2021 INITIAL    LONG TERM GOALS:    LTG Name Target Date Goal status  1 Pt  will become independent with final HEP in order to demonstrate synthesis of PT education.   06/10/2021 INITIAL  2 Pt will be able to demonstrate/report ability to sit/stand/sleep for extended periods of time without pain in order to demonstrate functional improvement and tolerance to static positioning.   06/10/2021 INITIAL  3 Pt will demonstrate at least a 25.6 improvement in Oswestry Index in order to demonstrate a clinically significant change in LBP and function.     06/10/2021 INITIAL  4 Pt will be able to perform 5XSTS in under 12s  in order to demonstrate functional improvement above the cut off score for adults.       06/10/2021 INITIAL    PLAN: PT FREQUENCY: 2x/week   PT DURATION: 8 weeks   PLANNED INTERVENTIONS: Therapeutic exercises, Therapeutic activity, Neuro Muscular re-education, Balance training, Gait training, Patient/Family education, Joint mobilization, Stair training, Orthotic/Fit training, Aquatic Therapy, Dry Needling, Electrical  stimulation, Spinal mobilization, Cryotherapy, Moist heat, Taping, Vasopneumatic device, Traction, Ultrasound, Ionotophoresis 4mg /ml Dexamethasone, and Manual therapy       TODAY'S TREATMENT:  05/01/21  Pt seen for aquatic therapy today.  Treatment took place in water 3.25-4.8 ft in depth at the [facility] pool. Temp of water was 94.  Pt entered/exited the pool via stairs step to pattern independently with bilat rail. Warm up: forward, backward and side stepping/walking cues for increased step length, increased speed, hand placement to increase resistance.  Pt entered water for aquatic therapy for first time and was introduced to principles and therapeutic effects of water as she ambulated and acclimated to pool. Pt ambulated forward, backwards and side stepping engaging posterior and anterior chain.  4-6 lengths each cues for increased step length and speed for increasing resistance.  Seated Stretching Gastroc, hamstring, and adductor 3 x 30 second hold -knee flex/ext 2 x 10 reps VC for increasing speed as able -flutter kicking at hips 3x20  Standing Using kick board hip hinges 2x10 -lateral core rotation 2 x 10 -kick board push downs 2 x 15 water perturbations. VC for tightened core. -STS from 3rd water step wihtout ue assist 2 x 10.   Step ups on bottom water step forward, backward and side stepping R/L x 10. VC and demonstration for weight shifting and maintaining tight core. Water walking 2 widths between most exercises for recovery and stretching forward/backward and sidestepping.    04/29/21 Pt seen for aquatic therapy today.  Treatment took place in water 3.25-4.8 ft in depth at the [facility] pool. Temp of water was 95.  Pt entered/exited the pool via stairs step to pattern independently with bilat rail. Warm up: forward, backward and side stepping/walking cues for increased step length, increased speed, hand placement to increase resistance.  Pt  entered water for aquatic therapy for first time and was introduced to principles and therapeutic effects of water as she ambulated and acclimated to pool. Pt ambulated forward, backwards and side stepping engaging posterior and anterior chain.  4-6 lengths each cues for increased step length and speed for increasing resistance.  Seated Stretching Gastroc, hamstring, and adductor 3 x 30 second hold -knee flex/ext 2 x 10 reps VC for increasing speed as able  Standing Using kick board hip hinges 2x10 -lateral core rotation 2 x 10 -kick board push downs 2 x 15 water perturbations. VC for tightened core. -STS from 3rd water step wihtout ue assist 2 x 10.  VC and demo for proper execution Using ankle buoys and holding to pool wall -hip add/abd 2 x 10 -hip extension 2 x 10  -marching 2x 10 -hip flex2 x 10  Water walking 2 widths between most exercises for recovery and stretching forward/backward and sidestepping.  Pt requires buoyancy for support and to offload joints with strengthening exercises. Viscosity of the water is needed for resistance of strengthening; water current perturbations provides challenge to standing balance unsupported, requiring increased core activation.   04/22/21   Pt requires buoyancy for support and to offload joints with strengthening exercises. Viscosity of the water is needed for resistance of strengthening; water current perturbations provides challenge to standing balance unsupported, requiring increased core activation.  Pt seen for aquatic therapy today.  Treatment took place in water 3.25-4.8 ft in depth at the [facility] pool. Temp of water was 91.  Pt entered/exited the pool via stairs step to pattern independently with bilat rail. Warm up: forward, backward and side stepping/walking cues for increased step length, increased speed, hand placement to increase resistance.  Pt entered water for aquatic therapy for first time and was introduced to  principles and therapeutic effects of water as she ambulated and acclimated to pool. Pt ambulated forward, backwards and side stepping engaging posterior and anterior chain.  4-6 lengths each cues for increased step length and speed for increasing resistance.  Seated Stretching Gastroc, hamstring, and adductor 3 x 30 second hold  -knee flex/ext 2 x 10 reps VC for increasing speed as able  Standing Using ankle buoys and holding to pool wall -hip add/abd 2 x 10 VC and demonstration for technique. Pt tends to hip flex lle. -marching 2x 10 Hip flex stretch with noodle R/L 3 x 30  sec. Right gastroc cramp -knee to wall hip flex strengthening x 10 R/L     Pt requires buoyancy for support and to offload joints with strengthening exercises. Viscosity of the water is needed for resistance of strengthening; water current perturbations provides challenge to standing balance unsupported, requiring increased core activation.    PATIENT EDUCATION:  Water properties, benefits of aquatic therapy.  PLAN FOR NEXT SESSION: hip extensor stretch R. Jarmarcus Wambold (Frankie) Drayton Tieu MPT  05/01/2021, 10:39 AM

## 2021-05-04 ENCOUNTER — Encounter: Payer: Self-pay | Admitting: Podiatry

## 2021-05-04 ENCOUNTER — Ambulatory Visit (INDEPENDENT_AMBULATORY_CARE_PROVIDER_SITE_OTHER): Payer: Medicare Other | Admitting: Podiatry

## 2021-05-04 ENCOUNTER — Other Ambulatory Visit: Payer: Self-pay

## 2021-05-04 DIAGNOSIS — M79674 Pain in right toe(s): Secondary | ICD-10-CM

## 2021-05-04 DIAGNOSIS — M79675 Pain in left toe(s): Secondary | ICD-10-CM

## 2021-05-04 DIAGNOSIS — M216X1 Other acquired deformities of right foot: Secondary | ICD-10-CM

## 2021-05-04 DIAGNOSIS — L84 Corns and callosities: Secondary | ICD-10-CM

## 2021-05-04 DIAGNOSIS — B351 Tinea unguium: Secondary | ICD-10-CM | POA: Diagnosis not present

## 2021-05-04 DIAGNOSIS — M216X2 Other acquired deformities of left foot: Secondary | ICD-10-CM

## 2021-05-04 DIAGNOSIS — M2010 Hallux valgus (acquired), unspecified foot: Secondary | ICD-10-CM

## 2021-05-04 NOTE — Progress Notes (Signed)
This patient returns to the office for evaluation and treatment of long thick painful nails .  This patient is unable to trim her own nails since the patient cannot reach her feet.  Patient says the nails are painful walking and wearing his shoes.  She returns for preventive foot care services.  General Appearance  Alert, conversant and in no acute stress.  Vascular  Dorsalis pedis and posterior tibial  pulses are palpable  bilaterally.  Capillary return is within normal limits  bilaterally. Temperature is within normal limits  bilaterally.  Neurologic  Senn-Weinstein monofilament wire test within normal limits  bilaterally. Muscle power within normal limits bilaterally.  Nails Thick disfigured discolored nails with subungual debris  from hallux to fifth toes bilaterally. No evidence of bacterial infection or drainage bilaterally.  Orthopedic  No limitations of motion  feet .  No crepitus or effusions noted.   HAV 1st MPJ  B/L.  Hammer toes 2-5  B/L. Plantar flexed fifth met  B/L.  Skin  normotropic skin with no porokeratosis noted bilaterally.  No signs of infections or ulcers noted.   Callus subfifth met  B/L.  Onychomycosis  Pain in toes right foot  Pain in toes left foot  Porokeratosis  B/L.  Debridement  of nails  1-5  B/L with a nail nipper.  Nails were then filed using a dremel tool with no incidents. Debride porokeratosis with # 15 blade  B/L. Calluses seem to be improving. RTC 10 weeks    Gardiner Barefoot DPM

## 2021-05-06 ENCOUNTER — Other Ambulatory Visit: Payer: Self-pay

## 2021-05-06 ENCOUNTER — Ambulatory Visit (HOSPITAL_BASED_OUTPATIENT_CLINIC_OR_DEPARTMENT_OTHER): Payer: Medicare Other | Admitting: Physical Therapy

## 2021-05-06 ENCOUNTER — Encounter (HOSPITAL_BASED_OUTPATIENT_CLINIC_OR_DEPARTMENT_OTHER): Payer: Self-pay | Admitting: Physical Therapy

## 2021-05-06 DIAGNOSIS — G8929 Other chronic pain: Secondary | ICD-10-CM

## 2021-05-06 DIAGNOSIS — M545 Low back pain, unspecified: Secondary | ICD-10-CM | POA: Diagnosis not present

## 2021-05-06 DIAGNOSIS — R2689 Other abnormalities of gait and mobility: Secondary | ICD-10-CM

## 2021-05-06 DIAGNOSIS — R262 Difficulty in walking, not elsewhere classified: Secondary | ICD-10-CM | POA: Diagnosis not present

## 2021-05-06 DIAGNOSIS — M6281 Muscle weakness (generalized): Secondary | ICD-10-CM | POA: Diagnosis not present

## 2021-05-06 DIAGNOSIS — R293 Abnormal posture: Secondary | ICD-10-CM

## 2021-05-06 DIAGNOSIS — R2681 Unsteadiness on feet: Secondary | ICD-10-CM

## 2021-05-06 NOTE — Therapy (Signed)
OUTPATIENT PHYSICAL THERAPY TREATMENT NOTE   Patient Name: Sylvia Maldonado MRN: [id number] DOB:13-Feb-1941, 80 y.o., female Today's Date: 05/06/2021  PCP: Kathyrn Lass, MD REFERRING PROVIDER: Kathyrn Lass, MD   PT End of Session - 05/06/21 1039     Visit Number 5    Number of Visits 17    Date for PT Re-Evaluation 07/14/21    Authorization Type Medicare    PT Start Time 1035    PT Stop Time 1119    PT Time Calculation (min) 44 min    Equipment Utilized During Treatment Other (comment)    Activity Tolerance Patient tolerated treatment well    Behavior During Therapy [facility] for tasks assessed/performed             Past Medical History:  Diagnosis Date   Cataract    Depression    Diverticulosis    Dry eyes    Hx of adenomatous colonic polyps    Internal hemorrhoids    Parkinsonism ([place]) 07/19/2018   On Abilify   Past Surgical History:  Procedure Laterality Date   cataracts surg     COLONOSCOPY     EYE SURGERY     PELVIC FLOOR REPAIR     TONSILLECTOMY     VAGINAL HYSTERECTOMY     Patient Active Problem List   Diagnosis Date Noted   Plantar flexed metatarsal bone of left foot 02/16/2021   Plantar flexed metatarsal bone of right foot 02/16/2021   Degeneration of lumbar intervertebral disc 11/26/2020   Incontinence of feces 11/26/2020   Other specified disorders of bone density and structure, other site 11/26/2020   Personal history of malignant melanoma of skin 11/26/2020   Pure hypercholesterolemia 11/26/2020   Recurrent major depression ([place]) 11/26/2020   Unspecified abnormal finding in specimens from other organs, systems and tissues 11/26/2020   Vitamin D deficiency 11/26/2020   Vulvar intraepithelial neoplasia (VIN) grade 1 11/26/2020   Hammer toe, acquired 05/08/2019   Hav (hallux abducto valgus), unspecified laterality 05/08/2019   Pain due to onychomycosis of toenails of both feet 01/30/2019   Corns and callosities 01/30/2019   Parkinsonism ([place])  07/19/2018   Depression, major, recurrent, moderate ([address]) 09/03/2014    Class: Chronic   Diarrhea 06/18/2014   Leukopenia 02/03/2013   History of depression 02/03/2013   Chest pain 02/02/2013   Changing skin lesion 09/12/2012    REFERRING DIAG: Lumbar spondylosis Peripheral Neuropathy   THERAPY DIAG:  Abnormal posture  Other abnormalities of gait and mobility  Unsteadiness on feet  Chronic pain of right knee  Difficulty in walking, not elsewhere classified  Muscle weakness (generalized)    SUBJECTIVE: "I feel like my balance is getting worse"  PAIN:   Are you having pain? No Worst 5/10, Best 0/10 Pain location: L lumbar Pain orientation: Left  PAIN TYPE: sharp, it just "hurts"  Pain description: intermittent  Aggravating factors: sitting too long, lying on L side while sleeping,  Relieving factors: walking/movement    OBJECTIVE: PERTINENT HISTORY:  Peripheral neuropathy      PRECAUTIONS: None   WEIGHT BEARING RESTRICTIONS No   FALLS:  Has patient fallen in last 6 months? No,   LIVING ENVIRONMENT: Lives with: lives with their family and lives alone Lives in: apartment Stairs: Yes; External: 6 steps; Rail on both sides going up Has following equipment at home: None   PLOF: Independent   PATIENT GOALS Pt states she would like to get rid of the pain, stay shape, preventing falls. Pt  states her biggest challenge is balance.      OBJECTIVE:    DIAGNOSTIC FINDINGS:  No imaging found for L/S in chart review   PATIENT SURVEYS:  Modified Oswestry 18 / 50 or 36 %    SCREENING FOR RED FLAGS: Bowel or bladder incontinence: No Spinal tumors: No Cauda equina syndrome: No Compression fracture: No     COGNITION:          Overall cognitive status: Within functional limits for tasks assessed                        SENSATION:          Light touch: Appears intact          HOME EXERCISE PROGRAM: Access Code: [email or web address]       ASSESSMENT:    CLINICAL IMPRESSION:  Strength improving as evidenced by good toleration of addition of 3 lb weights. Focus today on core strength with balance. Progressed pt to standing supported by 2 foam buoys while completing le exercises for core activation. SLS and tandem indep with vision submerged to waist. Vision eliminated with best hold of 6 seconds. Initiated standing on noodle for adde challenge.  Throughout session vc for tighten core.  Pt reports session being difficult today.    REHAB POTENTIAL: Good   CLINICAL DECISION MAKING: Stable/uncomplicated   EVALUATION COMPLEXITY: Low     GOALS:   SHORT TERM GOALS:   STG Name Target Date Goal status  1 Pt will become independent with HEP in order to demonstrate synthesis of PT education.   Baseline:  04/29/2021 INITIAL  2 Pt will demonstrate at least a 12.8 improvement in Oswestry Index in order to demonstrate a clinically significant change in LBP and function.   Baseline:  05/13/2021   INITIAL  3 Pt will be able to demonstrate ability to perform SLS for >10s on each limb in order to demonstrate functional improvement in balance for self-care and house hold duties.   Baseline: 05/13/2021 INITIAL  4 Pt will report at least 2 pt reduction on VAS scale for pain in order to demonstrate functional improvement with household activity, self care, and ADL.     Baseline: 05/13/2021 INITIAL    LONG TERM GOALS:    LTG Name Target Date Goal status  1 Pt  will become independent with final HEP in order to demonstrate synthesis of PT education.   06/10/2021 INITIAL  2 Pt will be able to demonstrate/report ability to sit/stand/sleep for extended periods of time without pain in order to demonstrate functional improvement and tolerance to static positioning.   06/10/2021 INITIAL  3 Pt will demonstrate at least a 25.6 improvement in Oswestry Index in order to demonstrate a clinically significant change in LBP and function.     06/10/2021 INITIAL  4  Pt will be able to perform 5XSTS in under 12s  in order to demonstrate functional improvement above the cut off score for adults.       06/10/2021 INITIAL    PLAN: PT FREQUENCY: 2x/week   PT DURATION: 8 weeks   PLANNED INTERVENTIONS: Therapeutic exercises, Therapeutic activity, Neuro Muscular re-education, Balance training, Gait training, Patient/Family education, Joint mobilization, Stair training, Orthotic/Fit training, Aquatic Therapy, Dry Needling, Electrical stimulation, Spinal mobilization, Cryotherapy, Moist heat, Taping, Vasopneumatic device, Traction, Ultrasound, Ionotophoresis 4mg /ml Dexamethasone, and Manual therapy       TODAY'S TREATMENT:  05/01/21  Pt seen for aquatic therapy today.  Treatment took place in  water 3.25-4.8 ft in depth at the [facility] pool. Temp of water was 94.  Pt entered/exited the pool via stairs step to pattern independently with bilat rail. Warm up: forward, backward and side stepping/walking cues for increased step length, increased speed, hand placement to increase resistance.  Pt entered water for aquatic therapy for first time and was introduced to principles and therapeutic effects of water as she ambulated and acclimated to pool. Pt ambulated forward, backwards and side stepping engaging posterior and anterior chain.  4-6 lengths each cues for increased step length and speed for increasing resistance.  Seated Stretching Gastroc, hamstring, and adductor 3 x 30 second hold -knee flex/ext 2 x 12 reps VC for increasing speed added 3 lb weight -flutter kicking at hip 3 x 20 3lb weight  Standing -hip circles 2 x 20 ea R/L in hip flex then abduction Using 3 lb ankle wt -hip add/abd 2 x 10 -hip extension 2 x 10  -marching 2x 10 -hip flex2 x 10  Balance -SLS with vision 2 trials of 20 seconds -Tandem with vision 2 trails of 20 sec.  Vision eliminated after several tries best 6 sec Noodle- trials of standing balance.  Water  walking 2 widths between most exercises for recovery and stretching forward/backward and sidestepping.    04/29/21 Pt seen for aquatic therapy today.  Treatment took place in water 3.25-4.8 ft in depth at the [facility] pool. Temp of water was 95.  Pt entered/exited the pool via stairs step to pattern independently with bilat rail. Warm up: forward, backward and side stepping/walking cues for increased step length, increased speed, hand placement to increase resistance.  Pt entered water for aquatic therapy for first time and was introduced to principles and therapeutic effects of water as she ambulated and acclimated to pool. Pt ambulated forward, backwards and side stepping engaging posterior and anterior chain.  4-6 lengths each cues for increased step length and speed for increasing resistance.  Seated Stretching Gastroc, hamstring, and adductor 3 x 30 second hold -knee flex/ext 2 x 10 reps VC for increasing speed as able -flutter kicking at hips 3x20   Standing -knee flex/ext 2 x 12 reps VC for increasing speed added 3 lb weight -flutter kicking at hip 3 x 20 3lb weight Using kick board hip hinges 2x10 -lateral core rotation 2 x 10 -kick board push downs 2 x 15 water perturbations. VC for tightened core. -STS from 3rd water step wihtout ue assist 2 x 10.  VC and demo for proper execution Using ankle buoy -hip add/abd 2 x 10 -hip extension 2 x 10  -marching 2x 10 -hip flex2 x 10  Water walking 2 widths between most exercises for recovery and stretching forward/backward and sidestepping.  Pt requires buoyancy for support and to offload joints with strengthening exercises. Viscosity of the water is needed for resistance of strengthening; water current perturbations provides challenge to standing balance unsupported, requiring increased core activation.   04/22/21   Pt requires buoyancy for support and to offload joints with strengthening exercises. Viscosity of the  water is needed for resistance of strengthening; water current perturbations provides challenge to standing balance unsupported, requiring increased core activation.  Pt seen for aquatic therapy today.  Treatment took place in water 3.25-4.8 ft in depth at the [facility] pool. Temp of water was 91.  Pt entered/exited the pool via stairs step to pattern independently with bilat rail. Warm up: forward, backward and side stepping/walking cues for increased step  length, increased speed, hand placement to increase resistance.  Pt entered water for aquatic therapy for first time and was introduced to principles and therapeutic effects of water as she ambulated and acclimated to pool. Pt ambulated forward, backwards and side stepping engaging posterior and anterior chain.  4-6 lengths each cues for increased step length and speed for increasing resistance.  Seated Stretching Gastroc, hamstring, and adductor 3 x 30 second hold  -knee flex/ext 2 x 10 reps VC for increasing speed as able  Standing Using ankle buoys and holding to pool wall -hip add/abd 2 x 10 VC and demonstration for technique. Pt tends to hip flex lle. -marching 2x 10 Hip flex stretch with noodle R/L 3 x 30 sec. Right gastroc cramp -knee to wall hip flex strengthening x 10 R/L     Pt requires buoyancy for support and to offload joints with strengthening exercises. Viscosity of the water is needed for resistance of strengthening; water current perturbations provides challenge to standing balance unsupported, requiring increased core activation.    PATIENT EDUCATION:  Water properties, benefits of aquatic therapy.  PLAN FOR NEXT SESSION: hip extensor stretch R. Sylvia Maldonado (Sylvia Maldonado) Sylvia Maldonado MPT  05/06/2021, 1:53 PM

## 2021-05-08 ENCOUNTER — Ambulatory Visit (HOSPITAL_BASED_OUTPATIENT_CLINIC_OR_DEPARTMENT_OTHER): Payer: Medicare Other | Admitting: Physical Therapy

## 2021-05-08 ENCOUNTER — Encounter (HOSPITAL_BASED_OUTPATIENT_CLINIC_OR_DEPARTMENT_OTHER): Payer: Self-pay | Admitting: Physical Therapy

## 2021-05-08 ENCOUNTER — Other Ambulatory Visit: Payer: Self-pay

## 2021-05-08 DIAGNOSIS — M6281 Muscle weakness (generalized): Secondary | ICD-10-CM | POA: Diagnosis not present

## 2021-05-08 DIAGNOSIS — R262 Difficulty in walking, not elsewhere classified: Secondary | ICD-10-CM | POA: Diagnosis not present

## 2021-05-08 DIAGNOSIS — M545 Low back pain, unspecified: Secondary | ICD-10-CM | POA: Diagnosis not present

## 2021-05-08 DIAGNOSIS — M25561 Pain in right knee: Secondary | ICD-10-CM

## 2021-05-08 DIAGNOSIS — R2681 Unsteadiness on feet: Secondary | ICD-10-CM

## 2021-05-08 DIAGNOSIS — R2689 Other abnormalities of gait and mobility: Secondary | ICD-10-CM | POA: Diagnosis not present

## 2021-05-08 DIAGNOSIS — G8929 Other chronic pain: Secondary | ICD-10-CM

## 2021-05-08 DIAGNOSIS — R293 Abnormal posture: Secondary | ICD-10-CM

## 2021-05-08 NOTE — Therapy (Signed)
OUTPATIENT PHYSICAL THERAPY TREATMENT NOTE   Patient Name: Sylvia Maldonado MRN: [id number] DOB:1940/09/19, 80 y.o., female Today's Date: 05/08/2021  PCP: Kathyrn Lass, MD REFERRING PROVIDER: Kathyrn Lass, MD   PT End of Session - 05/08/21 1044     Visit Number 6    Number of Visits 17    Date for PT Re-Evaluation 07/14/21    Authorization Type Medicare    PT Start Time 1039    PT Stop Time 1117    PT Time Calculation (min) 38 min    Equipment Utilized During Treatment Other (comment)    Activity Tolerance Patient tolerated treatment well    Behavior During Therapy WFL for tasks assessed/performed             Past Medical History:  Diagnosis Date   Cataract    Depression    Diverticulosis    Dry eyes    Hx of adenomatous colonic polyps    Internal hemorrhoids    Parkinsonism ([place]) 07/19/2018   On Abilify   Past Surgical History:  Procedure Laterality Date   cataracts surg     COLONOSCOPY     EYE SURGERY     PELVIC FLOOR REPAIR     TONSILLECTOMY     VAGINAL HYSTERECTOMY     Patient Active Problem List   Diagnosis Date Noted   Plantar flexed metatarsal bone of left foot 02/16/2021   Plantar flexed metatarsal bone of right foot 02/16/2021   Degeneration of lumbar intervertebral disc 11/26/2020   Incontinence of feces 11/26/2020   Other specified disorders of bone density and structure, other site 11/26/2020   Personal history of malignant melanoma of skin 11/26/2020   Pure hypercholesterolemia 11/26/2020   Recurrent major depression ([place]) 11/26/2020   Unspecified abnormal finding in specimens from other organs, systems and tissues 11/26/2020   Vitamin D deficiency 11/26/2020   Vulvar intraepithelial neoplasia (VIN) grade 1 11/26/2020   Hammer toe, acquired 05/08/2019   Hav (hallux abducto valgus), unspecified laterality 05/08/2019   Pain due to onychomycosis of toenails of both feet 01/30/2019   Corns and callosities 01/30/2019   Parkinsonism ([place])  07/19/2018   Depression, major, recurrent, moderate ([place]) 09/03/2014    Class: Chronic   Diarrhea 06/18/2014   Leukopenia 02/03/2013   History of depression 02/03/2013   Chest pain 02/02/2013   Changing skin lesion 09/12/2012    REFERRING DIAG: Lumbar spondylosis Peripheral Neuropathy   THERAPY DIAG:  Abnormal posture  Other abnormalities of gait and mobility  Unsteadiness on feet  Chronic pain of right knee  Difficulty in walking, not elsewhere classified  Muscle weakness (generalized)    SUBJECTIVE: "I feel like my balance is getting worse"  PAIN:   Are you having pain? No 0/10 Pain location: L lumbar Pain orientation: Left  PAIN TYPE: sharp, it just "hurts"  Pain description: intermittent  Aggravating factors: sitting too long, lying on L side while sleeping,  Relieving factors: walking/movement    OBJECTIVE: PERTINENT HISTORY:  Peripheral neuropathy      PRECAUTIONS: None   WEIGHT BEARING RESTRICTIONS No   FALLS:  Has patient fallen in last 6 months? No,   LIVING ENVIRONMENT: Lives with: lives with their family and lives alone Lives in: apartment Stairs: Yes; External: 6 steps; Rail on both sides going up Has following equipment at home: None   PLOF: Independent   PATIENT GOALS Pt states she would like to get rid of the pain, stay shape, preventing falls. Pt states her biggest  challenge is balance.      OBJECTIVE:    DIAGNOSTIC FINDINGS:  No imaging found for L/S in chart review   PATIENT SURVEYS:  Modified Oswestry 18 / 50 or 36 %    SCREENING FOR RED FLAGS: Bowel or bladder incontinence: No Spinal tumors: No Cauda equina syndrome: No Compression fracture: No     COGNITION:          Overall cognitive status: Within functional limits for tasks assessed                        SENSATION:          Light touch: Appears intact          HOME EXERCISE PROGRAM: Access Code: [email or web address]       ASSESSMENT:   CLINICAL  IMPRESSION:  Pt reporting she is doing well, no discomfort. She completes all prescribed exercises without complaint of pain or any particular difficulty. Pt challenged with backward step ups for balance training.  Requires vc and demonstration for weight shifting with side and backward step ups.    REHAB POTENTIAL: Good   CLINICAL DECISION MAKING: Stable/uncomplicated   EVALUATION COMPLEXITY: Low     GOALS:   SHORT TERM GOALS:   STG Name Target Date Goal status  1 Pt will become independent with HEP in order to demonstrate synthesis of PT education.   Baseline:  04/29/2021 INITIAL  2 Pt will demonstrate at least a 12.8 improvement in Oswestry Index in order to demonstrate a clinically significant change in LBP and function.   Baseline:  05/13/2021   INITIAL  3 Pt will be able to demonstrate ability to perform SLS for >10s on each limb in order to demonstrate functional improvement in balance for self-care and house hold duties.   Baseline: 05/13/2021 INITIAL  4 Pt will report at least 2 pt reduction on VAS scale for pain in order to demonstrate functional improvement with household activity, self care, and ADL.     Baseline: 05/13/2021 INITIAL    LONG TERM GOALS:    LTG Name Target Date Goal status  1 Pt  will become independent with final HEP in order to demonstrate synthesis of PT education.   06/10/2021 INITIAL  2 Pt will be able to demonstrate/report ability to sit/stand/sleep for extended periods of time without pain in order to demonstrate functional improvement and tolerance to static positioning.   06/10/2021 INITIAL  3 Pt will demonstrate at least a 25.6 improvement in Oswestry Index in order to demonstrate a clinically significant change in LBP and function.     06/10/2021 INITIAL  4 Pt will be able to perform 5XSTS in under 12s  in order to demonstrate functional improvement above the cut off score for adults.       06/10/2021 INITIAL    PLAN: PT FREQUENCY:  2x/week   PT DURATION: 8 weeks   PLANNED INTERVENTIONS: Therapeutic exercises, Therapeutic activity, Neuro Muscular re-education, Balance training, Gait training, Patient/Family education, Joint mobilization, Stair training, Orthotic/Fit training, Aquatic Therapy, Dry Needling, Electrical stimulation, Spinal mobilization, Cryotherapy, Moist heat, Taping, Vasopneumatic device, Traction, Ultrasound, Ionotophoresis 4mg /ml Dexamethasone, and Manual therapy       TODAY'S TREATMENT:  05/01/21  Pt seen for aquatic therapy today.  Treatment took place in water 3.25-4.8 ft in depth at the [facility] pool. Temp of water was 94.  Pt entered/exited the pool via stairs step to pattern independently with bilat rail. Warm up: forward, backward  and side stepping/walking cues for increased step length, increased speed, hand placement to increase resistance.  Pt entered water for aquatic therapy for first time and was introduced to principles and therapeutic effects of water as she ambulated and acclimated to pool. Pt ambulated forward, backwards and side stepping engaging posterior and anterior chain.  4-6 lengths each cues for increased step length and speed for increasing resistance.  Seated Stretching Gastroc, hamstring, and adductor 3 x 30 second hold  -knee flex/ext 2 x 12 reps VC for increasing speed added 3 lb weight -flutter kicking at hip 3 x 20 sec cues for tight abdominals and glute while completing  Standing -noodle kick downs hip in neutral and then external rotation x 10 ea. R/L -Step ups x10 forward, side stepping and backward R/L -Kick board push downs 3 x 15 reps with manual perturbations -Kick board lateral rotations R/L x10 (resisted core rotator str)    Water walking 2 widths between most exercises for recovery and stretching forward/backward and sidestepping.    04/29/21 Pt seen for aquatic therapy today.  Treatment took place in water 3.25-4.8 ft in depth at the  [facility] pool. Temp of water was 95.  Pt entered/exited the pool via stairs step to pattern independently with bilat rail. Warm up: forward, backward and side stepping/walking cues for increased step length, increased speed, hand placement to increase resistance.  Pt entered water for aquatic therapy for first time and was introduced to principles and therapeutic effects of water as she ambulated and acclimated to pool. Pt ambulated forward, backwards and side stepping engaging posterior and anterior chain.  4-6 lengths each cues for increased step length and speed for increasing resistance.  Seated Stretching Gastroc, hamstring, and adductor 3 x 30 second hold -knee flex/ext 2 x 10 reps VC for increasing speed as able -flutter kicking at hips 3x20   Standing -knee flex/ext 2 x 12 reps VC for increasing speed added 3 lb weight -flutter kicking at hip 3 x 20 3lb weight Using kick board hip hinges 2x10 -lateral core rotation 2 x 10 -kick board push downs 2 x 15 water perturbations. VC for tightened core. -STS from 3rd water step wihtout ue assist 2 x 10.  VC and demo for proper execution Using ankle buoy -hip add/abd 2 x 10 -hip extension 2 x 10  -marching 2x 10 -hip flex2 x 10  Water walking 2 widths between most exercises for recovery and stretching forward/backward and sidestepping.  Pt requires buoyancy for support and to offload joints with strengthening exercises. Viscosity of the water is needed for resistance of strengthening; water current perturbations provides challenge to standing balance unsupported, requiring increased core activation.   04/22/21   Pt requires buoyancy for support and to offload joints with strengthening exercises. Viscosity of the water is needed for resistance of strengthening; water current perturbations provides challenge to standing balance unsupported, requiring increased core activation.  Pt seen for aquatic therapy today.  Treatment  took place in water 3.25-4.8 ft in depth at the [facility] pool. Temp of water was 91.  Pt entered/exited the pool via stairs step to pattern independently with bilat rail. Warm up: forward, backward and side stepping/walking cues for increased step length, increased speed, hand placement to increase resistance 4-6 lengths.  Seated Stretching Gastroc, hamstring, and adductor 3 x 30 second hold  -knee flex/ext 2 x 10 reps using ankle buoys VC for increasing speed as able. - flutter kicking core tight 3 x 20  sec trial.  Standing Using ankle buoys and holding to pool wall -hip add/abd 2 x 10 VC and demonstration for technique. Pt tends to hip flex lle. -marching 2x 10 Hip flex stretch with noodle R/L 3 x 30 sec. Right gastroc cramp -knee to wall hip flex strengthening x 10 R/L     Pt requires buoyancy for support and to offload joints with strengthening exercises. Viscosity of the water is needed for resistance of strengthening; water current perturbations provides challenge to standing balance unsupported, requiring increased core activation.    PATIENT EDUCATION:  Water properties, benefits of aquatic therapy.  PLAN FOR NEXT SESSION: hip extensor stretch R. Sebrina Kessner ([place]) Aston Lawhorn MPT  05/08/2021, 1:19 PM

## 2021-05-11 ENCOUNTER — Other Ambulatory Visit: Payer: Self-pay

## 2021-05-11 ENCOUNTER — Encounter (HOSPITAL_BASED_OUTPATIENT_CLINIC_OR_DEPARTMENT_OTHER): Payer: Self-pay | Admitting: Physical Therapy

## 2021-05-11 ENCOUNTER — Ambulatory Visit (HOSPITAL_BASED_OUTPATIENT_CLINIC_OR_DEPARTMENT_OTHER): Payer: Medicare Other | Admitting: Physical Therapy

## 2021-05-11 DIAGNOSIS — M6281 Muscle weakness (generalized): Secondary | ICD-10-CM | POA: Diagnosis not present

## 2021-05-11 DIAGNOSIS — G8929 Other chronic pain: Secondary | ICD-10-CM

## 2021-05-11 DIAGNOSIS — R262 Difficulty in walking, not elsewhere classified: Secondary | ICD-10-CM

## 2021-05-11 DIAGNOSIS — R2689 Other abnormalities of gait and mobility: Secondary | ICD-10-CM | POA: Diagnosis not present

## 2021-05-11 DIAGNOSIS — R2681 Unsteadiness on feet: Secondary | ICD-10-CM

## 2021-05-11 DIAGNOSIS — M545 Low back pain, unspecified: Secondary | ICD-10-CM

## 2021-05-11 NOTE — Therapy (Signed)
OUTPATIENT PHYSICAL THERAPY PROGRESS NOTE   Patient Name: Sylvia Maldonado MRN: [id number] DOB:[birth year], 80 y.o., female Today's Date: 05/11/2021  PCP: Kathyrn Lass, MD REFERRING PROVIDER: Kathyrn Lass, MD   PT End of Session - 05/11/21 1015     Visit Number 7    Number of Visits 17    Date for PT Re-Evaluation 07/14/21    Authorization Type Medicare    PT Start Time 1020    PT Stop Time 1100    PT Time Calculation (min) 40 min    Equipment Utilized During Treatment Other (comment)    Activity Tolerance Patient tolerated treatment well    Behavior During Therapy WFL for tasks assessed/performed              Past Medical History:  Diagnosis Date   Cataract    Depression    Diverticulosis    Dry eyes    Hx of adenomatous colonic polyps    Internal hemorrhoids    Parkinsonism ([place]) 07/19/2018   On Abilify   Past Surgical History:  Procedure Laterality Date   cataracts surg     COLONOSCOPY     EYE SURGERY     PELVIC FLOOR REPAIR     TONSILLECTOMY     VAGINAL HYSTERECTOMY     Patient Active Problem List   Diagnosis Date Noted   Plantar flexed metatarsal bone of left foot 02/16/2021   Plantar flexed metatarsal bone of right foot 02/16/2021   Degeneration of lumbar intervertebral disc 11/26/2020   Incontinence of feces 11/26/2020   Other specified disorders of bone density and structure, other site 11/26/2020   Personal history of malignant melanoma of skin 11/26/2020   Pure hypercholesterolemia 11/26/2020   Recurrent major depression ([place]) 11/26/2020   Unspecified abnormal finding in specimens from other organs, systems and tissues 11/26/2020   Vitamin D deficiency 11/26/2020   Vulvar intraepithelial neoplasia (VIN) grade 1 11/26/2020   Hammer toe, acquired 05/08/2019   Hav (hallux abducto valgus), unspecified laterality 05/08/2019   Pain due to onychomycosis of toenails of both feet 01/30/2019   Corns and callosities 01/30/2019   Parkinsonism ([place])  07/19/2018   Depression, major, recurrent, moderate ([place]) 09/03/2014    Class: Chronic   Diarrhea 06/18/2014   Leukopenia 02/03/2013   History of depression 02/03/2013   Chest pain 02/02/2013   Changing skin lesion 09/12/2012    REFERRING DIAG: Lumbar spondylosis Peripheral Neuropathy   THERAPY DIAG:  Unsteadiness on feet  Chronic pain of right knee  Difficulty in walking, not elsewhere classified  Muscle weakness (generalized)  Pain, lumbar region    SUBJECTIVE: Pt states she is doing very well. She thorouhly enjoys the home balance exercises and she states her back pain is much better than before. She would like more balance exercises for home. Pt states she has been getting rashes on her legs are arms and thinks it maybe due to the chemicals in pool. Pt states she is about 75% better in terms of her back.   PAIN:   Are you having pain? No 0/10 Pain location: L lumbar Pain orientation: Left  PAIN TYPE: sharp, it just "hurts"  Pain description: intermittent  Aggravating factors: sitting too long, lying on L side while sleeping,  Relieving factors: walking/movement    OBJECTIVE: PERTINENT HISTORY:  Peripheral neuropathy      PRECAUTIONS: None   WEIGHT BEARING RESTRICTIONS No   FALLS:  Has patient fallen in last 6 months? No,   LIVING ENVIRONMENT: Lives  with: lives with their family and lives alone Lives in: apartment Stairs: Yes; External: 6 steps; Rail on both sides going up Has following equipment at home: None   PLOF: Independent   PATIENT GOALS Pt states she would like to get rid of the pain, stay shape, preventing falls. Pt states her biggest challenge is balance.      OBJECTIVE:    PATIENT SURVEYS:  Oswestry Score:  3 / 50 or 6 %   4 Stage Balance:   NBOS: >10s Semi-tandem: >10s Tandem: >10s SLS: 6s on R, >10s on L   POSTURE:  Mild kyphosis and rounded shoulders   LUMBARAROM/PROM   A/PROM A/PROM  04/15/2021  Flexion WFL   Extension 75%   Right lateral flexion 65% p! On L  Left lateral flexion 70% p! On L  Right rotation WFL  Left rotation WFL   (Blank rows = not tested)     (Blank rows = not tested)   LE MMT:   MMT Right 04/15/2021 Left 04/15/2021  Hip flexion 4+/5 4+/5  Hip extension 4+/5 4+/5  Hip abduction 4+/5 4+/5  Hip adduction 4+/5 4+/5  Knee extension 4+/5 4+/5   (Blank rows = not tested)    FUNCTIONAL TESTS:  5 times sit to stand: 10.8s    GAIT: Distance walked: 103f Assistive device utilized: None Comments: WFL during straight line, discontinuous stepping with turns       TODAY'S TREATMENT     HEP reviewed and discussed, especially with safety and home set up during balance exercise  Exercises Supine Lower Trunk Rotation - 2 x daily - 7 x weekly - 2 sets - 10 reps Supine Posterior Pelvic Tilt - 2 x daily - 7 x weekly - 2 sets - 10 reps - 3 hold Seated Quadratus Lumborum Stretch in Chair - 2 x daily - 7 x weekly - 1 sets - 3 reps - 30 hold Standing Tandem Balance with Counter Support - 1 x daily - 7 x weekly - 1 sets - 10 reps - 20 hold Standing with Head Rotation - 1 x daily - 7 x weekly - 1 sets - 5 reps - 30 hold (70 BPM)  Backward Walking with Counter Support - 1 x daily - 7 x weekly - 1 sets - 3 reps - [id number]          PATIENT EDUCATION:  Education details:  anatomy, exercise progression, DOMS expectations, muscle firing,  envelope of function, HEP, POC   Person educated: Patient Education method: Explanation, Demonstration, Tactile cues, Verbal cues, and Handouts Education comprehension: verbalized understanding and returned demonstration     HOME EXERCISE PROGRAM: Access Code: [email or web address]:   CLINICAL IMPRESSION: Pt with signficant functional improvements in pt reported outcome and objective measures. Pt still with LE weakness noted during balance activity but has increased LE strength with 5XSTS test. Pt HEP updated to improve dynamic  stability and pt will transition to land in order to improve dynamic stability and to reduce pt report of skin rash from pool environment. Pt with difficulty still with dynamic gait and unsteadiness during clinical observation. Pt to continue with more dynamic balance during land sessions as back pain has seemed to improve greatly overall. Pt would benefit from continued skilled therapy in order to reach goals and maximize functional lumbopelvic strength/ROM and balance for prevention of further functional decline.    Objective impairments include decreased activity tolerance, decreased balance, decreased endurance, decreased mobility, difficulty walking, decreased ROM,  decreased strength, hypomobility, increased muscle spasms, impaired flexibility, improper body mechanics, postural dysfunction, and pain. These impairments are limiting patient from cleaning, community activity, driving, and recreation, and exercise . Personal factors including Age, Social background, Time since onset of injury/illness/exacerbation, and 1 comorbidity:    are also affecting patient's functional outcome. Patient will benefit from skilled PT to address above impairments and improve overall function.   REHAB POTENTIAL: Good   CLINICAL DECISION MAKING: Stable/uncomplicated   EVALUATION COMPLEXITY: Low     GOALS:   SHORT TERM GOALS:   STG Name Target Date Goal status  1 Pt will become independent with HEP in order to demonstrate synthesis of PT education.   Baseline:  04/29/2021 Achieved  2 Pt will demonstrate at least a 12.8 improvement in Oswestry Index in order to demonstrate a clinically significant change in LBP and function.   Baseline:  05/13/2021   Achieved  3 Pt will be able to demonstrate ability to perform SLS for >10s on each limb in order to demonstrate functional improvement in balance for self-care and house hold duties.   Baseline: 05/13/2021 Partially Met  4 Pt will report at least 2 pt reduction  on VAS scale for pain in order to demonstrate functional improvement with household activity, self care, and ADL.     Baseline: 05/13/2021 Achieved    LONG TERM GOALS:    LTG Name Target Date Goal status  1 Pt  will become independent with final HEP in order to demonstrate synthesis of PT education.   06/10/2021 Partially Met  2 Pt will be able to demonstrate/report ability to sit/stand/sleep for extended periods of time without pain in order to demonstrate functional improvement and tolerance to static positioning.   06/10/2021 Partially Met  3 Pt will demonstrate at least a 25.6 improvement in Oswestry Index in order to demonstrate a clinically significant change in LBP and function.     06/10/2021 Achieved  4 Pt will be able to perform 5XSTS in under 12s  in order to demonstrate functional improvement above the cut off score for adults.       06/10/2021 Achieved  5 Pt will be able to demonstrate fluid dynamic changes of direction without LOB in order to demonstrate functional improvement in balance for prevention of falls. 06/10/2021 NEW    PLAN: PT FREQUENCY: 2x/week   PT DURATION: 8 weeks   PLANNED INTERVENTIONS: Therapeutic exercises, Therapeutic activity, Neuro Muscular re-education, Balance training, Gait training, Patient/Family education, Joint mobilization, Stair training, Orthotic/Fit training, Aquatic Therapy, Dry Needling, Electrical stimulation, Spinal mobilization, Cryotherapy, Moist heat, Taping, Vasopneumatic device, Traction, Ultrasound, Ionotophoresis 16m/ml Dexamethasone, and Manual therapy   PLAN FOR NEXT SESSION: dynamic balance, stepping/turn, obstacle navigation        ADaleen BoPT, DPT 05/11/21 1:09 PM

## 2021-05-14 ENCOUNTER — Ambulatory Visit (INDEPENDENT_AMBULATORY_CARE_PROVIDER_SITE_OTHER): Payer: Medicare Other | Admitting: Psychology

## 2021-05-14 DIAGNOSIS — F331 Major depressive disorder, recurrent, moderate: Secondary | ICD-10-CM

## 2021-05-18 ENCOUNTER — Ambulatory Visit (HOSPITAL_BASED_OUTPATIENT_CLINIC_OR_DEPARTMENT_OTHER): Payer: Medicare Other | Admitting: Physical Therapy

## 2021-05-20 ENCOUNTER — Encounter (HOSPITAL_BASED_OUTPATIENT_CLINIC_OR_DEPARTMENT_OTHER): Payer: Self-pay | Admitting: Physical Therapy

## 2021-05-20 ENCOUNTER — Ambulatory Visit (HOSPITAL_BASED_OUTPATIENT_CLINIC_OR_DEPARTMENT_OTHER): Payer: Medicare Other | Attending: Sports Medicine | Admitting: Physical Therapy

## 2021-05-20 ENCOUNTER — Other Ambulatory Visit: Payer: Self-pay

## 2021-05-20 DIAGNOSIS — R2681 Unsteadiness on feet: Secondary | ICD-10-CM | POA: Diagnosis not present

## 2021-05-20 DIAGNOSIS — R262 Difficulty in walking, not elsewhere classified: Secondary | ICD-10-CM | POA: Diagnosis not present

## 2021-05-20 DIAGNOSIS — M545 Low back pain, unspecified: Secondary | ICD-10-CM | POA: Insufficient documentation

## 2021-05-20 DIAGNOSIS — M48061 Spinal stenosis, lumbar region without neurogenic claudication: Secondary | ICD-10-CM | POA: Diagnosis not present

## 2021-05-20 DIAGNOSIS — M6281 Muscle weakness (generalized): Secondary | ICD-10-CM | POA: Diagnosis not present

## 2021-05-20 NOTE — Therapy (Signed)
OUTPATIENT PHYSICAL THERAPY PROGRESS NOTE   Patient Name: Sylvia Maldonado MRN: [id number] DOB:07/07/1941, 80 y.o., female Today's Date: 05/20/2021  PCP: Kathyrn Lass, MD REFERRING PROVIDER: Kathyrn Lass, MD   PT End of Session - 05/20/21 1440     Visit Number 8    Number of Visits 17    Date for PT Re-Evaluation 07/14/21    Authorization Type Medicare    PT Start Time 1435    PT Stop Time 1515    PT Time Calculation (min) 40 min    Equipment Utilized During Treatment Other (comment)    Activity Tolerance Patient tolerated treatment well    Behavior During Therapy WFL for tasks assessed/performed              Past Medical History:  Diagnosis Date   Cataract    Depression    Diverticulosis    Dry eyes    Hx of adenomatous colonic polyps    Internal hemorrhoids    Parkinsonism ([place]) 07/19/2018   On Abilify   Past Surgical History:  Procedure Laterality Date   cataracts surg     COLONOSCOPY     EYE SURGERY     PELVIC FLOOR REPAIR     TONSILLECTOMY     VAGINAL HYSTERECTOMY     Patient Active Problem List   Diagnosis Date Noted   Plantar flexed metatarsal bone of left foot 02/16/2021   Plantar flexed metatarsal bone of right foot 02/16/2021   Degeneration of lumbar intervertebral disc 11/26/2020   Incontinence of feces 11/26/2020   Other specified disorders of bone density and structure, other site 11/26/2020   Personal history of malignant melanoma of skin 11/26/2020   Pure hypercholesterolemia 11/26/2020   Recurrent major depression ([place]) 11/26/2020   Unspecified abnormal finding in specimens from other organs, systems and tissues 11/26/2020   Vitamin D deficiency 11/26/2020   Vulvar intraepithelial neoplasia (VIN) grade 1 11/26/2020   Hammer toe, acquired 05/08/2019   Hav (hallux abducto valgus), unspecified laterality 05/08/2019   Pain due to onychomycosis of toenails of both feet 01/30/2019   Corns and callosities 01/30/2019   Parkinsonism ([place])  07/19/2018   Depression, major, recurrent, moderate ([place]) 09/03/2014    Class: Chronic   Diarrhea 06/18/2014   Leukopenia 02/03/2013   History of depression 02/03/2013   Chest pain 02/02/2013   Changing skin lesion 09/12/2012    REFERRING DIAG: Lumbar spondylosis Peripheral Neuropathy   THERAPY DIAG:  Unsteadiness on feet  Muscle weakness (generalized)  Pain, lumbar region  Difficulty walking    SUBJECTIVE: Pt states she is doing very well. She is consistently doing her balance exercises and does feel it is progressing.   PAIN:   Are you having pain? Yes 2/10 Pain location: L lumbar Pain orientation: Left  PAIN TYPE: sharp, it just "hurts"  Pain description: intermittent  Aggravating factors: sitting too long, lying on L side while sleeping,  Relieving factors: walking/movement    OBJECTIVE: PERTINENT HISTORY:  Peripheral neuropathy      PRECAUTIONS: None   WEIGHT BEARING RESTRICTIONS No   FALLS:  Has patient fallen in last 6 months? No,   LIVING ENVIRONMENT: Lives with: lives with their family and lives alone Lives in: apartment Stairs: Yes; External: 6 steps; Rail on both sides going up Has following equipment at home: None   PLOF: Independent   PATIENT GOALS Pt states she would like to get rid of the pain, stay shape, preventing falls. Pt states her biggest challenge is  balance.      OBJECTIVE:       TODAY'S TREATMENT     HEP reviewed and discussed, especially with safety and home set up during balance exercise  Change of pace walking 114f x2 Change of direction 1068fx2 Standing with Head Rotation 30 hold (70 BPM up to 100 BPM) d/c for home  Backward Walking with Counter Support - cognitive counting tasks simultaneously- 10026f2 Cone weaving lateral, fwd, and retro Box drill CW and CCW 3x each Carioca 4x 25f27f PATIENT EDUCATION:  Education details:  anatomy, exercise progression, DOMS expectations, muscle firing,  envelope of  function, HEP, POC   Person educated: Patient Education method: Explanation, Demonstration, Tactile cues, Verbal cues, and Handouts Education comprehension: verbalized understanding and returned demonstration     HOME EXERCISE PROGRAM: Access Code: [email or web address]:   CLINICAL IMPRESSION: Pt continues to progress very well with focus today on her dynamic balance activities. Pt was able to demonstrate good VOR motion up to 100 BPM without LOB or dizziness. Session was then progressed to dynamic change of speed/direction movements. Pt able to progress HEP to include cognitive challenge with backwards walking. In clinic, pt does demonstrate decrease stride length, arm swing, and hesitation with movements when cog challenge is incorporated. Pt does have tendency to have downward gaze with dynamic activity but is able to correct when given VC. Pt required on SBA to CGA throughout session. HEP updated accordingly to progress home balance exercise. Pt without complaints of LBP at end of session. Likely 2-3 more visits and then D/C. Pt would benefit from continued skilled therapy in order to reach goals and maximize functional lumbopelvic strength/ROM and balance for prevention of further functional decline.    Objective impairments include decreased activity tolerance, decreased balance, decreased endurance, decreased mobility, difficulty walking, decreased ROM, decreased strength, hypomobility, increased muscle spasms, impaired flexibility, improper body mechanics, postural dysfunction, and pain. These impairments are limiting patient from cleaning, community activity, driving, and recreation, and exercise . Personal factors including Age, Social background, Time since onset of injury/illness/exacerbation, and 1 comorbidity:    are also affecting patient's functional outcome. Patient will benefit from skilled PT to address above impairments and improve overall function.   REHAB POTENTIAL: Good    CLINICAL DECISION MAKING: Stable/uncomplicated   EVALUATION COMPLEXITY: Low     GOALS:   SHORT TERM GOALS:   STG Name Target Date Goal status  1 Pt will become independent with HEP in order to demonstrate synthesis of PT education.   Baseline:  04/29/2021 Achieved  2 Pt will demonstrate at least a 12.8 improvement in Oswestry Index in order to demonstrate a clinically significant change in LBP and function.   Baseline:  05/13/2021   Achieved  3 Pt will be able to demonstrate ability to perform SLS for >10s on each limb in order to demonstrate functional improvement in balance for self-care and house hold duties.   Baseline: 05/13/2021 Partially Met  4 Pt will report at least 2 pt reduction on VAS scale for pain in order to demonstrate functional improvement with household activity, self care, and ADL.     Baseline: 05/13/2021 Achieved    LONG TERM GOALS:    LTG Name Target Date Goal status  1 Pt  will become independent with final HEP in order to demonstrate synthesis of PT education.   06/10/2021 Partially Met  2 Pt will be able to demonstrate/report ability to sit/stand/sleep for extended periods of time without  pain in order to demonstrate functional improvement and tolerance to static positioning.   06/10/2021 Partially Met  3 Pt will demonstrate at least a 25.6 improvement in Oswestry Index in order to demonstrate a clinically significant change in LBP and function.     06/10/2021 Achieved  4 Pt will be able to perform 5XSTS in under 12s  in order to demonstrate functional improvement above the cut off score for adults.       06/10/2021 Achieved  5 Pt will be able to demonstrate fluid dynamic changes of direction without LOB in order to demonstrate functional improvement in balance for prevention of falls. 06/10/2021 NEW    PLAN: PT FREQUENCY: 2x/week   PT DURATION: 8 weeks   PLANNED INTERVENTIONS: Therapeutic exercises, Therapeutic activity, Neuro Muscular  re-education, Balance training, Gait training, Patient/Family education, Joint mobilization, Stair training, Orthotic/Fit training, Aquatic Therapy, Dry Needling, Electrical stimulation, Spinal mobilization, Cryotherapy, Moist heat, Taping, Vasopneumatic device, Traction, Ultrasound, Ionotophoresis 9m/ml Dexamethasone, and Manual therapy   PLAN FOR NEXT SESSION: progress dynamic balance, uneven surface stepping, outdoor surface navigation        ADaleen BoPT, DPT 05/20/21 3:20 PM

## 2021-05-21 ENCOUNTER — Ambulatory Visit (HOSPITAL_BASED_OUTPATIENT_CLINIC_OR_DEPARTMENT_OTHER): Payer: Self-pay | Admitting: Physical Therapy

## 2021-05-25 ENCOUNTER — Ambulatory Visit (HOSPITAL_BASED_OUTPATIENT_CLINIC_OR_DEPARTMENT_OTHER): Payer: Medicare Other | Admitting: Physical Therapy

## 2021-05-26 ENCOUNTER — Ambulatory Visit (HOSPITAL_BASED_OUTPATIENT_CLINIC_OR_DEPARTMENT_OTHER): Payer: Self-pay | Admitting: Physical Therapy

## 2021-05-27 ENCOUNTER — Encounter (HOSPITAL_BASED_OUTPATIENT_CLINIC_OR_DEPARTMENT_OTHER): Payer: Self-pay | Admitting: Physical Therapy

## 2021-05-27 ENCOUNTER — Ambulatory Visit (HOSPITAL_BASED_OUTPATIENT_CLINIC_OR_DEPARTMENT_OTHER): Payer: Self-pay | Admitting: Physical Therapy

## 2021-05-27 DIAGNOSIS — D1801 Hemangioma of skin and subcutaneous tissue: Secondary | ICD-10-CM | POA: Diagnosis not present

## 2021-05-27 DIAGNOSIS — L821 Other seborrheic keratosis: Secondary | ICD-10-CM | POA: Diagnosis not present

## 2021-05-27 DIAGNOSIS — L814 Other melanin hyperpigmentation: Secondary | ICD-10-CM | POA: Diagnosis not present

## 2021-05-28 ENCOUNTER — Ambulatory Visit (INDEPENDENT_AMBULATORY_CARE_PROVIDER_SITE_OTHER): Payer: Medicare Other | Admitting: Psychology

## 2021-05-28 ENCOUNTER — Ambulatory Visit (HOSPITAL_BASED_OUTPATIENT_CLINIC_OR_DEPARTMENT_OTHER): Payer: Medicare Other | Admitting: Physical Therapy

## 2021-05-28 DIAGNOSIS — F331 Major depressive disorder, recurrent, moderate: Secondary | ICD-10-CM | POA: Diagnosis not present

## 2021-05-29 ENCOUNTER — Ambulatory Visit (HOSPITAL_BASED_OUTPATIENT_CLINIC_OR_DEPARTMENT_OTHER): Payer: Self-pay | Admitting: Physical Therapy

## 2021-05-29 ENCOUNTER — Encounter (HOSPITAL_BASED_OUTPATIENT_CLINIC_OR_DEPARTMENT_OTHER): Payer: Self-pay | Admitting: Physical Therapy

## 2021-06-01 ENCOUNTER — Ambulatory Visit (HOSPITAL_BASED_OUTPATIENT_CLINIC_OR_DEPARTMENT_OTHER): Payer: Self-pay | Admitting: Physical Therapy

## 2021-06-02 ENCOUNTER — Ambulatory Visit (HOSPITAL_BASED_OUTPATIENT_CLINIC_OR_DEPARTMENT_OTHER): Payer: Self-pay | Admitting: Physical Therapy

## 2021-06-02 ENCOUNTER — Encounter (HOSPITAL_BASED_OUTPATIENT_CLINIC_OR_DEPARTMENT_OTHER): Payer: Self-pay | Admitting: Physical Therapy

## 2021-06-03 ENCOUNTER — Encounter (HOSPITAL_BASED_OUTPATIENT_CLINIC_OR_DEPARTMENT_OTHER): Payer: Self-pay | Admitting: Physical Therapy

## 2021-06-03 ENCOUNTER — Ambulatory Visit (HOSPITAL_BASED_OUTPATIENT_CLINIC_OR_DEPARTMENT_OTHER): Payer: Medicare Other | Admitting: Physical Therapy

## 2021-06-03 ENCOUNTER — Other Ambulatory Visit: Payer: Self-pay

## 2021-06-03 DIAGNOSIS — M6281 Muscle weakness (generalized): Secondary | ICD-10-CM

## 2021-06-03 DIAGNOSIS — R2681 Unsteadiness on feet: Secondary | ICD-10-CM

## 2021-06-03 DIAGNOSIS — R262 Difficulty in walking, not elsewhere classified: Secondary | ICD-10-CM

## 2021-06-03 DIAGNOSIS — M545 Low back pain, unspecified: Secondary | ICD-10-CM

## 2021-06-03 NOTE — Therapy (Addendum)
OUTPATIENT PHYSICAL THERAPY D/C NOTE  PHYSICAL THERAPY DISCHARGE SUMMARY  Visits from Start of Care: 9   Plan: Patient agrees to discharge.  Patient goals were mostly met. Patient is being discharged due to meeting the majority of stated rehab goals and not returning to therapy.       Patient Name: Sylvia Maldonado MRN: [id number] DOB:12-08-1940, 80 y.o., female Today's Date: 06/03/2021  PCP: Kathyrn Lass, MD REFERRING PROVIDER: Kathyrn Lass, MD   PT End of Session - 06/03/21 1132     Visit Number 9    Number of Visits 17    Date for PT Re-Evaluation 07/14/21    Authorization Type Medicare    PT Start Time 1100    PT Stop Time 1130    PT Time Calculation (min) 30 min    Equipment Utilized During Treatment Other (comment)    Activity Tolerance Patient tolerated treatment well    Behavior During Therapy [facility] for tasks assessed/performed               Past Medical History:  Diagnosis Date   Cataract    Depression    Diverticulosis    Dry eyes    Hx of adenomatous colonic polyps    Internal hemorrhoids    Parkinsonism ([place]) 07/19/2018   On Abilify   Past Surgical History:  Procedure Laterality Date   cataracts surg     COLONOSCOPY     EYE SURGERY     PELVIC FLOOR REPAIR     TONSILLECTOMY     VAGINAL HYSTERECTOMY     Patient Active Problem List   Diagnosis Date Noted   Plantar flexed metatarsal bone of left foot 02/16/2021   Plantar flexed metatarsal bone of right foot 02/16/2021   Degeneration of lumbar intervertebral disc 11/26/2020   Incontinence of feces 11/26/2020   Other specified disorders of bone density and structure, other site 11/26/2020   Personal history of malignant melanoma of skin 11/26/2020   Pure hypercholesterolemia 11/26/2020   Recurrent major depression ([place]) 11/26/2020   Unspecified abnormal finding in specimens from other organs, systems and tissues 11/26/2020   Vitamin D deficiency 11/26/2020   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   Hammer toe, acquired 05/08/2019   Hav (hallux abducto valgus), unspecified laterality 05/08/2019   Pain due to onychomycosis of toenails of both feet 01/30/2019   Corns and callosities 01/30/2019   Parkinsonism ([place]) 07/19/2018   Depression, major, recurrent, moderate ([place]) 09/03/2014    Class: Chronic   Diarrhea 06/18/2014   Leukopenia 02/03/2013   History of depression 02/03/2013   Chest pain 02/02/2013   Changing skin lesion 09/12/2012    REFERRING DIAG: Lumbar spondylosis Peripheral Neuropathy   THERAPY DIAG:  Unsteadiness on feet  Muscle weakness (generalized)  Pain, lumbar region  Difficulty walking    SUBJECTIVE: Pt states the balance is better and her back pain is better. She feels like she is 90% back to normal.   PAIN:   Are you having pain? No 0/10 Pain location: L lumbar Pain orientation: Left  PAIN TYPE: sharp, it just "hurts"  Pain description: intermittent  Aggravating factors: sitting too long, lying on L side while sleeping,  Relieving factors: walking/movement    OBJECTIVE: PERTINENT HISTORY:  Peripheral neuropathy      PRECAUTIONS: None   WEIGHT BEARING RESTRICTIONS No   FALLS:  Has patient fallen in last 6 months? No,   LIVING ENVIRONMENT: Lives with: lives with their family and lives alone  Lives in: apartment Stairs: Yes; External: 6 steps; Rail on both sides going up Has following equipment at home: None   PLOF: Independent   PATIENT GOALS Pt states she would like to get rid of the pain, stay shape, preventing falls. Pt states her biggest challenge is balance.      OBJECTIVE:       TODAY'S TREATMENT     HEP reviewed and discussed, especially with safety and home set up during balance exercise  Change of pace walking 115f x2 Change of direction  multi directional 1015fx2  Foam balance- tandem, semi tandem, NBOS Obstacle stepping and navigation Carioca 4x 3059f6" box step up 2x10  each leg   PATIENT EDUCATION:  Education details:  anatomy, exercise progression, DOMS expectations, muscle firing,  envelope of function, HEP, POC   Person educated: Patient Education method: Explanation, Demonstration, Tactile cues, Verbal cues, and Handouts Education comprehension: verbalized understanding and returned demonstration     HOME EXERCISE PROGRAM: Access Code: [email or web address]: [email or web address] Date: 06/03/2021 Prepared by: AlaDaleen Boxercises Supine Lower Trunk Rotation - 2 x daily - 7 x weekly - 2 sets - 10 reps Supine Posterior Pelvic Tilt - 2 x daily - 7 x weekly - 2 sets - 10 reps - 3 hold Seated Quadratus Lumborum Stretch in Chair - 2 x daily - 7 x weekly - 1 sets - 3 reps - 30 hold Standing Tandem Balance with Counter Support - 1 x daily - 3-4 x weekly - 1 sets - 10 reps - 20 hold Backward Walking with Counter Support - 1 x daily - 3-4 x weekly - 1 sets - 3 reps - 75f1fld Braided Sidestepping - 1 x daily - 3-4 x weekly - 1 sets - 5 reps - 75ft6fd Step Up - 1 x daily - 3-4 x weekly - 2 sets - 10 reps   ASSESSMENT:   CLINICAL IMPRESSION: Pt demonstrates easy with dynamic movement, change of direction, and pliant surface balance at today's session. Pt's HEP updated at this time to include LE strengthening exercise. Pt without LOB today. Pt without complaints of back pain. Pt reports concern with balance but is able to perform all clinical balance testing/exercise without LOB or assist. Pt was supervision for all balance exercise today. Pt likely to full D/C at next session. Pt to schedule PRN. Pt independent with HEP at this time. Pt may benefit from continued skilled therapy in order to reach goals and maximize functional lumbopelvic strength/ROM and balance for prevention of further functional decline.    Objective impairments include decreased activity tolerance, decreased balance, decreased endurance, decreased mobility, difficulty walking,  decreased ROM, decreased strength, hypomobility, increased muscle spasms, impaired flexibility, improper body mechanics, postural dysfunction, and pain. These impairments are limiting patient from cleaning, community activity, driving, and recreation, and exercise . Personal factors including Age, Social background, Time since onset of injury/illness/exacerbation, and 1 comorbidity:    are also affecting patient's functional outcome. Patient will benefit from skilled PT to address above impairments and improve overall function.   REHAB POTENTIAL: Good   CLINICAL DECISION MAKING: Stable/uncomplicated   EVALUATION COMPLEXITY: Low     GOALS:   SHORT TERM GOALS:   STG Name Target Date Goal status  1 Pt will become independent with HEP in order to demonstrate synthesis of PT education.   Baseline:  04/29/2021 Achieved  2 Pt will demonstrate at least a 12.8 improvement in Oswestry Index in order to demonstrate a  clinically significant change in LBP and function.   Baseline:  05/13/2021   Achieved  3 Pt will be able to demonstrate ability to perform SLS for >10s on each limb in order to demonstrate functional improvement in balance for self-care and house hold duties.   Baseline: 05/13/2021 Partially Met  4 Pt will report at least 2 pt reduction on VAS scale for pain in order to demonstrate functional improvement with household activity, self care, and ADL.     Baseline: 05/13/2021 Achieved    LONG TERM GOALS:    LTG Name Target Date Goal status  1 Pt  will become independent with final HEP in order to demonstrate synthesis of PT education.   06/10/2021 Partially Met  2 Pt will be able to demonstrate/report ability to sit/stand/sleep for extended periods of time without pain in order to demonstrate functional improvement and tolerance to static positioning.   06/10/2021 Partially Met  3 Pt will demonstrate at least a 25.6 improvement in Oswestry Index in order to demonstrate a clinically  significant change in LBP and function.     06/10/2021 Achieved  4 Pt will be able to perform 5XSTS in under 12s  in order to demonstrate functional improvement above the cut off score for adults.       06/10/2021 Achieved  5 Pt will be able to demonstrate fluid dynamic changes of direction without LOB in order to demonstrate functional improvement in balance for prevention of falls. 06/10/2021 MET    PLAN: PT FREQUENCY: 2x/week   PT DURATION: 8 weeks   PLANNED INTERVENTIONS: Therapeutic exercises, Therapeutic activity, Neuro Muscular re-education, Balance training, Gait training, Patient/Family education, Joint mobilization, Stair training, Orthotic/Fit training, Aquatic Therapy, Dry Needling, Electrical stimulation, Spinal mobilization, Cryotherapy, Moist heat, Taping, Vasopneumatic device, Traction, Ultrasound, Ionotophoresis 66m/ml Dexamethasone, and Manual therapy   PLAN FOR NEXT SESSION: D/C        ADaleen BoPT, DPT 06/03/21 11:33 AM

## 2021-06-08 ENCOUNTER — Ambulatory Visit (HOSPITAL_BASED_OUTPATIENT_CLINIC_OR_DEPARTMENT_OTHER): Payer: Self-pay | Admitting: Physical Therapy

## 2021-06-10 ENCOUNTER — Encounter (HOSPITAL_BASED_OUTPATIENT_CLINIC_OR_DEPARTMENT_OTHER): Payer: Self-pay

## 2021-06-10 ENCOUNTER — Ambulatory Visit (HOSPITAL_BASED_OUTPATIENT_CLINIC_OR_DEPARTMENT_OTHER): Payer: Self-pay | Admitting: Physical Therapy

## 2021-06-10 ENCOUNTER — Encounter (HOSPITAL_BASED_OUTPATIENT_CLINIC_OR_DEPARTMENT_OTHER): Payer: Medicare Other | Admitting: Physical Therapy

## 2021-06-11 ENCOUNTER — Ambulatory Visit (INDEPENDENT_AMBULATORY_CARE_PROVIDER_SITE_OTHER): Payer: Medicare Other | Admitting: Psychology

## 2021-06-11 DIAGNOSIS — F331 Major depressive disorder, recurrent, moderate: Secondary | ICD-10-CM | POA: Diagnosis not present

## 2021-06-12 ENCOUNTER — Encounter (HOSPITAL_BASED_OUTPATIENT_CLINIC_OR_DEPARTMENT_OTHER): Payer: Self-pay | Admitting: Physical Therapy

## 2021-06-18 ENCOUNTER — Other Ambulatory Visit: Payer: Self-pay

## 2021-06-18 ENCOUNTER — Ambulatory Visit: Payer: Medicare Other | Admitting: Podiatry

## 2021-06-18 ENCOUNTER — Ambulatory Visit (INDEPENDENT_AMBULATORY_CARE_PROVIDER_SITE_OTHER): Payer: Medicare Other | Admitting: Podiatry

## 2021-06-18 DIAGNOSIS — L84 Corns and callosities: Secondary | ICD-10-CM

## 2021-06-18 DIAGNOSIS — M216X2 Other acquired deformities of left foot: Secondary | ICD-10-CM | POA: Diagnosis not present

## 2021-06-18 DIAGNOSIS — M216X1 Other acquired deformities of right foot: Secondary | ICD-10-CM | POA: Diagnosis not present

## 2021-06-18 DIAGNOSIS — R52 Pain, unspecified: Secondary | ICD-10-CM

## 2021-06-18 NOTE — Progress Notes (Signed)
  Subjective:  Patient ID: Sylvia Maldonado, female    DOB: 1941-01-28,  MRN: [id number]  Chief Complaint  Patient presents with   [place]ions      BIL pinky toe bone is coming through     80 y.o. female presents with the above complaint. History confirmed with patient.  Most of the pain is at submetatarsal 5 area  Objective:  Physical Exam: warm, good capillary refill, no trophic changes or ulcerative lesions, normal DP and PT pulses, normal sensory exam, and bilaterally submetatarsal 5 callus with prominence of the fifth metatarsal head plantarly.  Assessment:   1. Callus of foot   2. Pain   3. Plantar flexed metatarsal bone of left foot   4. Plantar flexed metatarsal bone of right foot      Plan:  Patient was evaluated and treated and all questions answered.  Debrided the hyperkeratotic lesions as courtesy today advised her to use urea cream and pumice stone which she bought from our office today.  Discussed with her the pressure that goes on the fifth metatarsal head and the reason for the callus formation.  I specular shoe gear which appears to be appropriate and in good condition and I added dancers pads to offload the areas as well.  Hopefully this will offer her some relief  Return if symptoms worsen or fail to improve.

## 2021-06-18 NOTE — Patient Instructions (Signed)
Look for urea 40% cream or ointment and apply to the thickened dry skin / calluses. This can be bought over the counter, at a pharmacy or online such as [employer].  

## 2021-06-19 DIAGNOSIS — R059 Cough, unspecified: Secondary | ICD-10-CM | POA: Diagnosis not present

## 2021-06-19 DIAGNOSIS — J111 Influenza due to unidentified influenza virus with other respiratory manifestations: Secondary | ICD-10-CM | POA: Diagnosis not present

## 2021-06-19 DIAGNOSIS — Z6820 Body mass index (BMI) 20.0-20.9, adult: Secondary | ICD-10-CM | POA: Diagnosis not present

## 2021-06-19 DIAGNOSIS — J029 Acute pharyngitis, unspecified: Secondary | ICD-10-CM | POA: Diagnosis not present

## 2021-06-25 ENCOUNTER — Ambulatory Visit (INDEPENDENT_AMBULATORY_CARE_PROVIDER_SITE_OTHER): Payer: Medicare Other | Admitting: Psychology

## 2021-06-25 DIAGNOSIS — F331 Major depressive disorder, recurrent, moderate: Secondary | ICD-10-CM

## 2021-06-25 NOTE — Progress Notes (Signed)
[facility] Counselor/Therapist Progress Note  Patient ID: Sylvia Maldonado, MRN: [id number],    Date: 06/25/2021  Time Spent: 50 minutes  Treatment Type: Individual Therapy  Reported Symptoms: depression  Mental Status Exam: Appearance:  Casual     Behavior: Appropriate  Motor: Normal  Speech/Language:  Normal Rate  Affect: Appropriate  Mood: normal  Thought process: normal  Thought content:   WNL  Sensory/Perceptual disturbances:   WNL  Orientation: oriented to person, place, time/date, and situation  Attention: Good  Concentration: Good  Memory: WNL  Fund of knowledge:  Good  Insight:   Good  Judgment:  Good  Impulse Control: Good   Risk Assessment: Danger to Self:  No Self-injurious Behavior: No Danger to Others: No Duty to Warn:no Physical Aggression / Violence:No  Access to Firearms a concern: No  Gang Involvement:No   Subjective: The patient attended an individual therapy session via video visit.  The patient gave verbal consent for the session to be the on video on WebEx.  The patient was in her home alone and the therapist was in the office.  The patient presents with a flat affect and mood is pleasant.  The patient reports that she is just getting over the flu.  The patient has a trip coming up at the end of the month and she is going to her granddaughter's wedding.  The patient talked about wanting to make sure that she is healthy.  We did some work around Armed forces logistics/support/administrative officer and talked about how she could communicate better with her daughters.  Interventions: Cognitive Behavioral Therapy and Assertiveness/Communication  Diagnosis:Major depressive disorder, recurrent episode, moderate (HCC)  Plan: Treatment Plan  Strengths/Abilities:  Intelligent, insightful  Treatment Preferences:  Outpatient Individual therapy  Statement of Needs:  "I need a therapist to help me with my depression"  Symptoms:  decreased motivation: (Status:  improved). poor concentration: (Status: improved). poor sleep: (Status: improved).  Problems Addressed:  Unipolar depression  Goals: 1. New Goal Statement for Unipolar Depression  Describe current and past experiences with depression including their impact on functioning and  attempts to resolve it. Target Date: 2021-09-20 Frequency: Biweekly Progress: 50 Modality: individual  2.Identify and replace thoughts and beliefs that support depression. Target Date: 2021-09-20 Frequency: Biweekly Progress: 60 Modality: individual  3.  Learn and implement behavioral strategies to overcome depression. Target Date: 2021-09-20 Frequency: Biweekly Progress: 60 Modality: individual  4.  Learn and implement problem-solving and decision-making skills. Target Date: 2021-09-20 Frequency: Biweekly Progress: 80 Modality: individual   Interventions by Therapist:  CBT , insight oriented approach and problem solving therapy  Daran Favaro G Juanito Gonyer, LCSW

## 2021-06-29 ENCOUNTER — Ambulatory Visit: Payer: Medicare Other | Admitting: Podiatry

## 2021-06-30 DIAGNOSIS — R6883 Chills (without fever): Secondary | ICD-10-CM | POA: Diagnosis not present

## 2021-06-30 DIAGNOSIS — R519 Headache, unspecified: Secondary | ICD-10-CM | POA: Diagnosis not present

## 2021-06-30 DIAGNOSIS — Z6820 Body mass index (BMI) 20.0-20.9, adult: Secondary | ICD-10-CM | POA: Diagnosis not present

## 2021-07-15 ENCOUNTER — Other Ambulatory Visit: Payer: Self-pay

## 2021-07-15 ENCOUNTER — Ambulatory Visit (INDEPENDENT_AMBULATORY_CARE_PROVIDER_SITE_OTHER): Payer: Medicare Other | Admitting: Podiatry

## 2021-07-15 ENCOUNTER — Encounter: Payer: Self-pay | Admitting: Podiatry

## 2021-07-15 DIAGNOSIS — M79675 Pain in left toe(s): Secondary | ICD-10-CM

## 2021-07-15 DIAGNOSIS — M216X2 Other acquired deformities of left foot: Secondary | ICD-10-CM

## 2021-07-15 DIAGNOSIS — M79674 Pain in right toe(s): Secondary | ICD-10-CM | POA: Diagnosis not present

## 2021-07-15 DIAGNOSIS — L84 Corns and callosities: Secondary | ICD-10-CM | POA: Diagnosis not present

## 2021-07-15 DIAGNOSIS — B351 Tinea unguium: Secondary | ICD-10-CM

## 2021-07-15 DIAGNOSIS — M216X1 Other acquired deformities of right foot: Secondary | ICD-10-CM

## 2021-07-15 NOTE — Progress Notes (Signed)
This patient returns to the office for evaluation and treatment of long thick painful nails .  This patient is unable to trim her own nails since the patient cannot reach her feet.  Patient says the nails are painful walking and wearing his shoes.  She returns for preventive foot care services.  General Appearance  Alert, conversant and in no acute stress.  Vascular  Dorsalis pedis and posterior tibial  pulses are palpable  bilaterally.  Capillary return is within normal limits  bilaterally. Temperature is within normal limits  bilaterally.  Neurologic  Senn-Weinstein monofilament wire test within normal limits  bilaterally. Muscle power within normal limits bilaterally.  Nails Thick disfigured discolored nails with subungual debris  from hallux to fifth toes bilaterally. No evidence of bacterial infection or drainage bilaterally.  Orthopedic  No limitations of motion  feet .  No crepitus or effusions noted.   HAV 1st MPJ  B/L.  Hammer toes 2-5  B/L. Plantar flexed fifth met  B/L.  Skin  normotropic skin with no porokeratosis noted bilaterally.  No signs of infections or ulcers noted.   Callus subfifth met  B/L.  Onychomycosis  Pain in toes right foot  Pain in toes left foot  Porokeratosis  B/L.  Debridement  of nails  1-5  B/L with a nail nipper.  Nails were then filed using a dremel tool with no incidents. Debride porokeratosis with # 15 blade  B/L. Calluses seem to be improving. RTC 10 weeks    Hailynn Slovacek DPM  

## 2021-07-23 ENCOUNTER — Ambulatory Visit (INDEPENDENT_AMBULATORY_CARE_PROVIDER_SITE_OTHER): Payer: Medicare Other | Admitting: Psychology

## 2021-07-23 DIAGNOSIS — F331 Major depressive disorder, recurrent, moderate: Secondary | ICD-10-CM | POA: Diagnosis not present

## 2021-07-23 NOTE — Progress Notes (Signed)
[facility] Counselor/Therapist Progress Note  Patient ID: Sylvia Maldonado, MRN: [id number],    Date: 07/23/2021  Time Spent: 60 minutes  Treatment Type: Individual Therapy  Reported Symptoms: depression  Mental Status Exam: Appearance:  Casual     Behavior: Appropriate  Motor: Normal  Speech/Language:  Normal Rate  Affect: Appropriate  Mood: normal  Thought process: normal  Thought content:   WNL  Sensory/Perceptual disturbances:   WNL  Orientation: oriented to person, place, time/date, and situation  Attention: Good  Concentration: Good  Memory: WNL  Fund of knowledge:  Good  Insight:   Good  Judgment:  Good  Impulse Control: Good   Risk Assessment: Danger to Self:  No Self-injurious Behavior: No Danger to Others: No Duty to Warn:no Physical Aggression / Violence:No  Access to Firearms a concern: No  Gang Involvement:No   Subjective: The patient attended an individual therapy session via video visit.  The patient gave verbal consent for the session to be the on video on WebEx.  The patient was in her home alone and the therapist was in the office.  The patient continues to present with a flat affect and mood is pleasant.  The patient states that she is tired from her travels over [place].  She says that she was sick for 12 days before she went to [place].  The patient says that she may lose her job.  We processed how to look for something else or how to ask for what she is interested in from her employer.  Interventions: Cognitive Behavioral Therapy and Assertiveness/Communication  Diagnosis:Major depressive disorder, recurrent episode, moderate (HCC)  Plan: Treatment Plan  Strengths/Abilities:  Intelligent, insightful  Treatment Preferences:  Outpatient Individual therapy  Statement of Needs:  "I need a therapist to help me with my depression"  Symptoms:  decreased motivation: (Status: improved). poor concentration: (Status: improved). poor  sleep: (Status: improved).  Problems Addressed:  Unipolar depression  Goals: 1. New Goal Statement for Unipolar Depression  Describe current and past experiences with depression including their impact on functioning and  attempts to resolve it. Target Date: 2021-09-20 Frequency: Biweekly Progress: 50 Modality: individual  2.Identify and replace thoughts and beliefs that support depression. Target Date: 2021-09-20 Frequency: Biweekly Progress: 60 Modality: individual  3.  Learn and implement behavioral strategies to overcome depression. Target Date: 2021-09-20 Frequency: Biweekly Progress: 60 Modality: individual  4.  Learn and implement problem-solving and decision-making skills. Target Date: 2021-09-20 Frequency: Biweekly Progress: 80 Modality: individual   Interventions by Therapist:  CBT , insight oriented approach and problem solving therapy  Lamesha Tibbits G Khamiyah Grefe, LCSW                  Fredericka Bottcher G Yonis Carreon, LCSW

## 2021-07-27 ENCOUNTER — Other Ambulatory Visit (HOSPITAL_COMMUNITY): Payer: Self-pay

## 2021-07-27 ENCOUNTER — Other Ambulatory Visit: Payer: Self-pay

## 2021-07-27 ENCOUNTER — Ambulatory Visit (INDEPENDENT_AMBULATORY_CARE_PROVIDER_SITE_OTHER): Payer: Medicare Other | Admitting: Podiatry

## 2021-07-27 DIAGNOSIS — M216X2 Other acquired deformities of left foot: Secondary | ICD-10-CM

## 2021-07-27 DIAGNOSIS — L84 Corns and callosities: Secondary | ICD-10-CM

## 2021-07-27 DIAGNOSIS — M216X1 Other acquired deformities of right foot: Secondary | ICD-10-CM | POA: Diagnosis not present

## 2021-07-27 MED ORDER — CITALOPRAM HYDROBROMIDE 20 MG PO TABS: 20.0000 mg | ORAL_TABLET | Freq: Every day | ORAL | 0 refills | Status: DC

## 2021-07-29 NOTE — Progress Notes (Signed)
°  Subjective:  Patient ID: Sylvia Maldonado, female    DOB: 08-17-1940,  MRN: [id number]  Chief Complaint  Patient presents with   Callouses    Painful callus    82 y.o. female returns for follow-up with the above complaint. History confirmed with patient.  She is doing very well.  The pads have helped quite a bit.  She returns with more shoes that she would like pads to be put in  Objective:  Physical Exam: warm, good capillary refill, no trophic changes or ulcerative lesions, normal DP and PT pulses, normal sensory exam, and bilaterally submetatarsal 5 callus with prominence of the fifth metatarsal head plantarly, much improved since last visit.  Assessment:   1. Callus of foot   2. Plantar flexed metatarsal bone of left foot   3. Plantar flexed metatarsal bone of right foot      Plan:  Patient was evaluated and treated and all questions answered.  Overall doing well and offloading with dancers pads has helped quite a bit.  I applied more of these to 4 pairs of shoes for her today.  She will follow-up as needed.  Return if symptoms worsen or fail to improve.

## 2021-08-06 ENCOUNTER — Ambulatory Visit (INDEPENDENT_AMBULATORY_CARE_PROVIDER_SITE_OTHER): Payer: Medicare Other | Admitting: Psychology

## 2021-08-06 DIAGNOSIS — F331 Major depressive disorder, recurrent, moderate: Secondary | ICD-10-CM

## 2021-08-06 NOTE — Progress Notes (Signed)
[facility] Counselor/Therapist Progress Note  Patient ID: Sylvia Maldonado, MRN: [id number],    Date: 08/06/2021  Time Spent: 50 minutes  Treatment Type: Individual Therapy  Reported Symptoms: depression  Mental Status Exam: Appearance:  Casual     Behavior: Appropriate  Motor: Normal  Speech/Language:  Normal Rate  Affect: Appropriate  Mood: normal  Thought process: normal  Thought content:   WNL  Sensory/Perceptual disturbances:   WNL  Orientation: oriented to person, place, time/date, and situation  Attention: Good  Concentration: Good  Memory: WNL  Fund of knowledge:  Good  Insight:   Good  Judgment:  Good  Impulse Control: Good   Risk Assessment: Danger to Self:  No Self-injurious Behavior: No Danger to Others: No Duty to Warn:no Physical Aggression / Violence:No  Access to Firearms a concern: No  Gang Involvement:No   Subjective: The patient attended an individual therapy session via video visit.  The patient gave verbal consent for the session to be the on video on WebEx.  The patient was in her home alone and the therapist was in the office.  The patient presents with a flat affect and mood is pleasant.  The patient reports that she got her hair cut and feels good about that.  The patient states that she has been working and going to the gym and doing yoga.  She states that she continues to do what she needs to do to take care of herself.  She is happy that she is continuing to be able to work and feels that work is very important for her.  We talked about self-care and she seems to be doing pretty well as far as her seasonal affective disorder.  She states that she does much better when it sunny outside.   Interventions: Cognitive Behavioral Therapy and Assertiveness/Communication  Diagnosis:Major depressive disorder, recurrent episode, moderate (HCC)  Plan: Treatment Plan  Strengths/Abilities:  Intelligent, insightful  Treatment  Preferences:  Outpatient Individual therapy  Statement of Needs:  "I need a therapist to help me with my depression"  Symptoms:  decreased motivation: (Status: improved). poor concentration: (Status: improved). poor sleep: (Status: improved).  Problems Addressed:  Unipolar depression  Goals: 1. New Goal Statement for Unipolar Depression  Describe current and past experiences with depression including their impact on functioning and  attempts to resolve it. Target Date: 2021-09-20 Frequency: Biweekly Progress: 50 Modality: individual  2.Identify and replace thoughts and beliefs that support depression. Target Date: 2021-09-20 Frequency: Biweekly Progress: 60 Modality: individual  3.  Learn and implement behavioral strategies to overcome depression. Target Date: 2021-09-20 Frequency: Biweekly Progress: 60 Modality: individual  4.  Learn and implement problem-solving and decision-making skills. Target Date: 2021-09-20 Frequency: Biweekly Progress: 80 Modality: individual   Interventions by Therapist:  CBT , insight oriented approach and problem solving therapy  Izabela Ow G Karita Dralle, LCSW

## 2021-08-12 ENCOUNTER — Other Ambulatory Visit: Payer: Self-pay

## 2021-08-12 ENCOUNTER — Telehealth (HOSPITAL_BASED_OUTPATIENT_CLINIC_OR_DEPARTMENT_OTHER): Payer: Medicare Other | Admitting: Psychiatry

## 2021-08-12 DIAGNOSIS — F325 Major depressive disorder, single episode, in full remission: Secondary | ICD-10-CM

## 2021-08-12 MED ORDER — ARIPIPRAZOLE 10 MG PO TABS: ORAL_TABLET | ORAL | 2 refills | Status: DC

## 2021-08-12 MED ORDER — CITALOPRAM HYDROBROMIDE 20 MG PO TABS: 20.0000 mg | ORAL_TABLET | Freq: Every day | ORAL | 8 refills | Status: DC

## 2021-08-12 NOTE — Progress Notes (Signed)
Patient ID: Sylvia Maldonado, female   DOB: [birth year], 81 y.o.   MRN: [id number] [facility] MD Progress Note  08/12/2021 1:51 PM Sylvia Maldonado  MRN:  [id number] Subjective: I am feel awful; I feel depressed. Principal Problem: Major depression in remission Diagnosis: Major depression recurrent   Today the patient is doing very well.  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  The patient loves what she does.  Patient has 4 grandchildren.  The oldest is 59 who recently got married in [place].  She has another 62 year old granddaughter is finishing physical therapy school.  She has 2 other grandchildren who live in this community who are ages 48 and 68 are doing fairly well.  So in essence her family is doing well.  The patient's health is very good.  She continues to walk on a regular basis.  She recently joined the [employer].  She has no chest pain shortness of breath or any neurological symptoms.  She recently turned 81 years of age.  Financially she is doing well.  She feels more well adjusted in this community and seems to accept the home she lives in.  She got a few good friends.  She denies daily depression or anhedonia.  She is sleeping and eating very well.  She is got good energy.  She has no problems thinking and concentrating.  She drinks no alcohol uses no drugs and has never been psychotic.  Patient is drawing a lot doing paintings.  I invited her to contribute one of her paintings to our office here.  Patient is very pleased with her life at this time.  We had long discussions about Abilify and its long-term use.  She understands the pros and cons of it and wishes to continue it.  Past efforts to reduce and have led to a clear decline in her mood state.  She will return to see me in 5 months and at that time we will perform an aims scale. Past Surgical History:  Procedure Laterality Date   cataracts surg      COLONOSCOPY     EYE SURGERY     PELVIC FLOOR REPAIR     TONSILLECTOMY     VAGINAL HYSTERECTOMY     Family History:  Family History  Problem Relation Age of Onset   CAD Neg Hx    Family Psychiatric  History:  Social History:  Social History   Substance and Sexual Activity  Alcohol Use No     Social History   Substance and Sexual Activity  Drug Use No    Social History   Socioeconomic History   Marital status: Single    Spouse name: Not on file   Number of children: Not on file   Years of education: Not on file   Highest education level: Not on file  Occupational History   Not on file  Tobacco Use   Smoking status: Never   Smokeless tobacco: Never  Vaping Use   Vaping Use: Never used  Substance and Sexual Activity   Alcohol use: No   Drug use: No   Sexual activity: Not Currently  Other Topics Concern   Not on file  Social History Narrative   Right handed    Lives alone at home    No caffeine usage    Social Determinants of Health   Financial Resource Strain:  Not on file  Food Insecurity: Not on file  Transportation Needs: Not on file  Physical Activity: Not on file  Stress: Not on file  Social Connections: Not on file   Additional Social History:                         Sleep: Good  Appetite:  Good  Current Medications: Current Outpatient Medications  Medication Sig Dispense Refill   ARIPiprazole (ABILIFY) 10 MG tablet 1  qam 90 tablet 2   Cholecalciferol (VITAMIN D-3) 1000 UNITS CAPS Take 2,000 Units by mouth daily.     citalopram (CELEXA) 20 MG tablet Take 1 tablet (20 mg total) by mouth daily. 30 tablet 8   cycloSPORINE (RESTASIS) 0.05 % ophthalmic emulsion Place 1 drop into both eyes daily.      loteprednol (LOTEMAX) 0.5 % ophthalmic suspension 1 drop into affected eye     Magnesium 200 MG TABS 1 tablet with a meal     SHINGRIX injection      Turmeric 500 MG CAPS 1 capsule     vitamin B-12 (CYANOCOBALAMIN) 1000 MCG tablet  Take 1,000 mcg by mouth daily.     No current facility-administered medications for this visit.    Lab Results: No results found for this or any previous visit (from the past 48 hour(s)).  Physical Findings: AIMS:  , ,  ,  ,    CIWA:    COWS:     Musculoskeletal: Strength & Muscle Tone: within normal limits Gait & Station: normal Patient leans: Right  Psychiatric Specialty Exam: ROS  There were no vitals taken for this visit.There is no height or weight on file to calculate BMI.  General Appearance: Casual  Eye Contact::  Good  Speech:  Clear and Coherent  Volume:  Normal  Mood:  NA  Affect:  Congruent  Thought Process:  Coherent  Orientation:  Full (Time, Place, and Person)  Thought Content:  WDL  Suicidal Thoughts:  No  Homicidal Thoughts:  No  Memory:  NA  Judgement:  NA  Insight:  Good  Psychomotor Activity:  Normal  Concentration:  Good  Recall:  Good  Fund of Knowledge:Good  Language: Good  Akathisia:  No  Handed:  Right  AIMS (if indicated):     Assets:  Desire for Improvement  ADL's:  Intact  Cognition: WNL  Sleep:       [name] 08/12/2021, 1:51 PM   This patient's first problem is that of major clinical depression.  She will continue taking 20 mg of Celexa and 10 mg of Abilify.  Recently per our suggestion she entered into psychotherapy with Bambi Cottle.  She seems to have made a good connection and is clearly benefiting by this therapeutic relationship.

## 2021-08-20 ENCOUNTER — Ambulatory Visit (INDEPENDENT_AMBULATORY_CARE_PROVIDER_SITE_OTHER): Payer: Medicare Other | Admitting: Psychology

## 2021-08-20 DIAGNOSIS — F331 Major depressive disorder, recurrent, moderate: Secondary | ICD-10-CM

## 2021-08-20 NOTE — Progress Notes (Signed)
[facility] Counselor/Therapist Progress Note  Patient ID: Sylvia Maldonado, MRN: [id number],    Date: 08/20/2021  Time Spent: 50 minutes  Treatment Type: Individual Therapy  Reported Symptoms: depression  Mental Status Exam: Appearance:  Casual     Behavior: Appropriate  Motor: Normal  Speech/Language:  Normal Rate  Affect: Appropriate  Mood: normal  Thought process: normal  Thought content:   WNL  Sensory/Perceptual disturbances:   WNL  Orientation: oriented to person, place, time/date, and situation  Attention: Good  Concentration: Good  Memory: WNL  Fund of knowledge:  Good  Insight:   Good  Judgment:  Good  Impulse Control: Good   Risk Assessment: Danger to Self:  No Self-injurious Behavior: No Danger to Others: No Duty to Warn:no Physical Aggression / Violence:No  Access to Firearms a concern: No  Gang Involvement:No   Subjective: The patient attended an individual therapy session via video visit.  The patient gave verbal consent for the session to be the on video on WebEx.  The patient was in her home alone and the therapist was in the office.  The patient presents with a flat affect and mood is anxious.  The patient states that she is having difficulty at work.  She just reports that one of her coworkers is reporting to her patient's son that she may not be doing what she needs to do to take care of his mother.  We did some problem solving and came up with a way for her to handle the situation with her employer.  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  We talked about next steps if there are problems or issues along the way.  Also encouraged her to call me if she had any concerns or need for session in between our regular every two week appointments.  Interventions: Cognitive  Behavioral Therapy and Assertiveness/Communication  Diagnosis:Major depressive disorder, recurrent episode, moderate (HCC)  Plan: Treatment Plan  Strengths/Abilities:  Intelligent, insightful  Treatment Preferences:  Outpatient Individual therapy  Statement of Needs:  "I need a therapist to help me with my depression"  Symptoms:  decreased motivation: (Status: improved). poor concentration: (Status: improved). poor sleep: (Status: improved).  Problems Addressed:  Unipolar depression  Goals: 1. New Goal Statement for Unipolar Depression  Describe current and past experiences with depression including their impact on functioning and  attempts to resolve it. Target Date: 2021-09-20 Frequency: Biweekly Progress: 50 Modality: individual  2.Identify and replace thoughts and beliefs that support depression. Target Date: 2021-09-20 Frequency: Biweekly Progress: 60 Modality: individual  3.  Learn and implement behavioral strategies to overcome depression. Target Date: 2021-09-20 Frequency: Biweekly Progress: 60 Modality: individual  4.  Learn and implement problem-solving and decision-making skills. Target Date: 2021-09-20 Frequency: Biweekly Progress: 80 Modality: individual   Interventions by Therapist:  CBT , insight oriented approach and problem solving therapy.  Treatment plan created with patient and patient approves plan.  Longino Trefz G Analeigha Nauman, LCSW

## 2021-09-03 ENCOUNTER — Other Ambulatory Visit (HOSPITAL_COMMUNITY): Payer: Self-pay | Admitting: *Deleted

## 2021-09-03 ENCOUNTER — Ambulatory Visit (INDEPENDENT_AMBULATORY_CARE_PROVIDER_SITE_OTHER): Payer: Medicare Other | Admitting: Psychology

## 2021-09-03 DIAGNOSIS — F331 Major depressive disorder, recurrent, moderate: Secondary | ICD-10-CM

## 2021-09-03 MED ORDER — CITALOPRAM HYDROBROMIDE 20 MG PO TABS: 20.0000 mg | ORAL_TABLET | Freq: Every day | ORAL | 1 refills | Status: DC

## 2021-09-03 NOTE — Progress Notes (Signed)
[facility] Counselor/Therapist Progress Note  Patient ID: Sylvia Maldonado, MRN: [id number],    Date: 09/03/2021  Time Spent: 50 minutes  Treatment Type: Individual Therapy  Reported Symptoms: depression  Mental Status Exam: Appearance:  Casual     Behavior: Appropriate  Motor: Normal  Speech/Language:  Normal Rate  Affect: Appropriate  Mood: normal  Thought process: normal  Thought content:   WNL  Sensory/Perceptual disturbances:   WNL  Orientation: oriented to person, place, time/date, and situation  Attention: Good  Concentration: Good  Memory: WNL  Fund of knowledge:  Good  Insight:   Good  Judgment:  Good  Impulse Control: Good   Risk Assessment: Danger to Self:  No Self-injurious Behavior: No Danger to Others: No Duty to Warn:no Physical Aggression / Violence:No  Access to Firearms a concern: No  Gang Involvement:No   Subjective: The patient attended an individual therapy session via video visit.  The patient gave verbal consent for the session to be the on video on WebEx.  The patient was in her home alone and the therapist was in the office.  The patient presents with a flat affect and mood is anxious.  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  The patient appears to be grieving some because of this current situation.  The patient would grieve if she lost her job but she also is grieving because she has a relationship with this lady that she is caregiving.  The situation at work with her coworker actually resolved itself.  I encouraged the patient to be thinking about what it is that she would like to do at work and what kind of patient she might want to work with because she feels like she needs to continue to work.  Provided supportive therapy and problem solving.  Interventions: Cognitive Behavioral Therapy and  Assertiveness/Communication  Diagnosis:Major depressive disorder, recurrent episode, moderate (HCC)  Plan: Treatment Plan  Strengths/Abilities:  Intelligent, insightful  Treatment Preferences:  Outpatient Individual therapy  Statement of Needs:  "I need a therapist to help me with my depression"  Symptoms:  decreased motivation: (Status: improved). poor concentration: (Status: improved). poor sleep: (Status: improved).  Problems Addressed:  Unipolar depression  Goals: 1. New Goal Statement for Unipolar Depression  Describe current and past experiences with depression including their impact on functioning and  attempts to resolve it. Target Date: 2021-09-20 Frequency: Biweekly Progress: 50 Modality: individual  2.Identify and replace thoughts and beliefs that support depression. Target Date: 2021-09-20 Frequency: Biweekly Progress: 60 Modality: individual  3.  Learn and implement behavioral strategies to overcome depression. Target Date: 2021-09-20 Frequency: Biweekly Progress: 60 Modality: individual  4.  Learn and implement problem-solving and decision-making skills. Target Date: 2021-09-20 Frequency: Biweekly Progress: 80 Modality: individual   Interventions by Therapist:  CBT , insight oriented approach and problem solving therapy.  Treatment plan created with patient and patient approves plan.  Murdock Jellison G Caymen Dubray, LCSW                                      Calisa Luckenbaugh G Honore Wipperfurth, LCSW

## 2021-09-17 ENCOUNTER — Ambulatory Visit: Payer: Medicare Other | Admitting: Psychology

## 2021-09-30 ENCOUNTER — Encounter: Payer: Self-pay | Admitting: Podiatry

## 2021-09-30 ENCOUNTER — Other Ambulatory Visit: Payer: Self-pay

## 2021-09-30 ENCOUNTER — Ambulatory Visit (INDEPENDENT_AMBULATORY_CARE_PROVIDER_SITE_OTHER): Payer: Medicare Other | Admitting: Podiatry

## 2021-09-30 DIAGNOSIS — L84 Corns and callosities: Secondary | ICD-10-CM

## 2021-09-30 DIAGNOSIS — M79674 Pain in right toe(s): Secondary | ICD-10-CM

## 2021-09-30 DIAGNOSIS — B351 Tinea unguium: Secondary | ICD-10-CM | POA: Diagnosis not present

## 2021-09-30 DIAGNOSIS — M79675 Pain in left toe(s): Secondary | ICD-10-CM

## 2021-09-30 DIAGNOSIS — M216X1 Other acquired deformities of right foot: Secondary | ICD-10-CM

## 2021-09-30 DIAGNOSIS — M216X2 Other acquired deformities of left foot: Secondary | ICD-10-CM

## 2021-09-30 NOTE — Progress Notes (Signed)
This patient returns to the office for evaluation and treatment of long thick painful nails .  This patient is unable to trim her own nails since the patient cannot reach her feet.  Patient says the nails are painful walking and wearing his shoes.  She returns for preventive foot care services.  General Appearance  Alert, conversant and in no acute stress.  Vascular  Dorsalis pedis and posterior tibial  pulses are palpable  bilaterally.  Capillary return is within normal limits  bilaterally. Temperature is within normal limits  bilaterally.  Neurologic  Senn-Weinstein monofilament wire test within normal limits  bilaterally. Muscle power within normal limits bilaterally.  Nails Thick disfigured discolored nails with subungual debris  from hallux to fifth toes bilaterally. No evidence of bacterial infection or drainage bilaterally.  Orthopedic  No limitations of motion  feet .  No crepitus or effusions noted.   HAV 1st MPJ  B/L.  Hammer toes 2-5  B/L. Plantar flexed fifth met  B/L.  Skin  normotropic skin with no porokeratosis noted bilaterally.  No signs of infections or ulcers noted.   Callus subfifth met  B/L.  Onychomycosis  Pain in toes right foot  Pain in toes left foot  Porokeratosis  B/L.  Debridement  of nails  1-5  B/L with a nail nipper.  Nails were then filed using a dremel tool with no incidents. Debride porokeratosis with # 15 blade  B/L. Calluses seem to be improving. RTC 10 weeks    Kaidence Callaway DPM  

## 2021-10-01 ENCOUNTER — Ambulatory Visit (INDEPENDENT_AMBULATORY_CARE_PROVIDER_SITE_OTHER): Payer: Medicare Other | Admitting: Psychology

## 2021-10-01 DIAGNOSIS — F331 Major depressive disorder, recurrent, moderate: Secondary | ICD-10-CM

## 2021-10-01 NOTE — Progress Notes (Signed)
[facility] Counselor/Therapist Progress Note ? ?Patient ID: Sylvia Maldonado, MRN: [id number],   ? ?Date: 10/01/2021 ? ?Time Spent: 50 minutes ? ?Treatment Type: Individual Therapy ? ?Reported Symptoms: depression ? ?Mental Status Exam: ?Appearance:  Casual     ?Behavior: Appropriate  ?Motor: Normal  ?Speech/Language:  Normal Rate  ?Affect: Appropriate  ?Mood: normal  ?Thought process: normal  ?Thought content:   WNL  ?Sensory/Perceptual disturbances:   WNL  ?Orientation: oriented to person, place, time/date, and situation  ?Attention: Good  ?Concentration: Good  ?Memory: WNL  ?Fund of knowledge:  Good  ?Insight:   Good  ?Judgment:  Good  ?Impulse Control: Good  ? ?Risk Assessment: ?Danger to Self:  No ?Self-injurious Behavior: No ?Danger to Others: No ?Duty to Warn:no ?Physical Aggression / Violence:No  ?Access to Firearms a concern: No  ?Gang Involvement:No  ? ?Subjective: The patient attended an individual therapy session via video visit.  The patient gave verbal consent for the session to be the on video on WebEx.  The patient was in her home alone and the therapist was in the office.  The patient presents with a flat affect and mood is pleasant.  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  She seems a little sad about this as she enjoys getting the money from caregiving.  Her agency does not have anybody that she can work with right now would be suitable for her.  The patient states that she is walking more and trying to fill her time with other activities.  I gave her the resource of senior resources of [place] to explore activities that she might be able to engage in.  We discussed going to once a month for therapy sessions as she does not have as many stressors as she had before. ? ?Interventions: Cognitive Behavioral Therapy and Assertiveness/Communication ? ?Diagnosis:Major depressive disorder, recurrent episode, moderate  ([place]) ? ?Plan: Treatment Plan ? ?Strengths/Abilities:  Intelligent, insightful ? ?Treatment Preferences:  Outpatient Individual therapy ? ?Statement of Needs:  "I need a therapist to help me with my depression" ? ?Symptoms:  decreased motivation: (Status: improved). poor concentration: (Status: improved). poor sleep: (Status: improved). ? ?Problems Addressed:  Unipolar depression ? ?Goals: 1. New Goal Statement for Unipolar Depression ? ?Describe current and past experiences with depression including their impact on functioning and  ?attempts to resolve it. ?Target Date: 2022-09-21 Frequency: monthly ?Progress: 50 Modality: individual ? ?2.Identify and replace thoughts and beliefs that support depression. ?Target Date: 2022-09-21 Frequency: monthly ?Progress: 60 Modality: individual ? ?3.  Learn and implement behavioral strategies to overcome depression. ?Target Date: 2022-09-21 Frequency: monthly ?Progress: 60 Modality: individual ? ?4.  Learn and implement problem-solving and decision-making skills. ?Target Date: 2022-09-21 Frequency: monthly ?Progress: 80 Modality: individual ? ? ?Interventions by Therapist:  CBT , insight oriented approach and problem solving therapy.  Treatment plan created with patient and patient approves plan. ? ?Aldred Mase G Iriel Nason, LCSW ? ? ? ? ? ? ? ? ? ? ? ? ? ? ? ? ? ? ? ? ? ? ? ? ? ? ? ? ? ? ? ? ? ? ? ? ? ?Emalyn Schou G Petro Talent, LCSW ? ? ? ? ? ? ? ? ? ? ? ? ? ? ?Jaydence Vanyo G Lailie Smead, LCSW ?

## 2021-10-07 DIAGNOSIS — Z01419 Encounter for gynecological examination (general) (routine) without abnormal findings: Secondary | ICD-10-CM | POA: Diagnosis not present

## 2021-10-07 DIAGNOSIS — Z1231 Encounter for screening mammogram for malignant neoplasm of breast: Secondary | ICD-10-CM | POA: Diagnosis not present

## 2021-10-15 ENCOUNTER — Ambulatory Visit: Payer: Medicare Other | Admitting: Psychology

## 2021-10-21 DIAGNOSIS — H40013 Open angle with borderline findings, low risk, bilateral: Secondary | ICD-10-CM | POA: Diagnosis not present

## 2021-10-27 ENCOUNTER — Ambulatory Visit (INDEPENDENT_AMBULATORY_CARE_PROVIDER_SITE_OTHER): Payer: Medicare Other | Admitting: Psychology

## 2021-10-27 DIAGNOSIS — F331 Major depressive disorder, recurrent, moderate: Secondary | ICD-10-CM

## 2021-10-27 NOTE — Progress Notes (Signed)
[facility] Counselor/Therapist Progress Note ? ?Patient ID: Niara Bunker, MRN: [id number],   ? ?Date: 10/27/2021 ? ?Time Spent: 50 minutes ? ?Treatment Type: Individual Therapy ? ?Reported Symptoms: depression ? ?Mental Status Exam: ?Appearance:  Casual     ?Behavior: Appropriate  ?Motor: Normal  ?Speech/Language:  Normal Rate  ?Affect: Appropriate  ?Mood: normal  ?Thought process: normal  ?Thought content:   WNL  ?Sensory/Perceptual disturbances:   WNL  ?Orientation: oriented to person, place, time/date, and situation  ?Attention: Good  ?Concentration: Good  ?Memory: WNL  ?Fund of knowledge:  Good  ?Insight:   Good  ?Judgment:  Good  ?Impulse Control: Good  ? ?Risk Assessment: ?Danger to Self:  No ?Self-injurious Behavior: No ?Danger to Others: No ?Duty to Warn:no ?Physical Aggression / Violence:No  ?Access to Firearms a concern: No  ?Gang Involvement:No  ? ?Subjective: The patient attended an individual therapy session via video visit.  The patient gave verbal consent for the session to be the on video on WebEx.  The patient was in her home alone and the therapist was in the office.  The patient presents with a flat affect and mood is anxious.  The patient reports that she bought a mattress and box spring and feels like she made a mistake.  She states that she has already paid for it and that there is no way to get out of it.  The patient reports that she lets people take advantage of her and that she does not pay attention to her that when she needs to notice.  We did some problem solving and I recommended that she try to contact them as they have not delivered the mattress or box Spring yet.  I encouraged her to stand up for herself and to call and let them know she wants her money back and that if she has to pay a cancellation fee that that would be okay but that she does not want the mattress and box spring.  I explained to her that she has nothing to lose really and trying to do this  because they are going to deliver it anyway and she is already paid for it however she may have a way to get out of purchasing the mattress and box spring.  We talked about the need for her to never make a decision when she feels pressured to do so and talked about strategies that she could utilize in order to not make this kind of decision the next time.  The patient also talked about the money piece of getting the mattress and box spring.  She definitely needs a new mattress however she felt pressured and to find when she about.  We talked about her never making that kind of decision like this when she feels pressured.  We have a session later in the week and the patient will give me an update on what happened and we will talk about how to cope if she was not able to return the items purchased. ?Interventions: Cognitive Behavioral Therapy and Assertiveness/Communication ? ?Diagnosis:Major depressive disorder, recurrent episode, moderate ([employer]) ? ?Plan: Treatment Plan ? ?Strengths/Abilities:  Intelligent, insightful ? ?Treatment Preferences:  Outpatient Individual therapy ? ?Statement of Needs:  "I need a therapist to help me with my depression" ? ?Symptoms:  decreased motivation: (Status: improved). poor concentration: (Status: improved). poor sleep: (Status: improved). ? ?Problems Addressed:  Unipolar depression ? ?Goals: 1. New Goal Statement for Unipolar Depression ? ?Describe current and past experiences  with depression including their impact on functioning and  ?attempts to resolve it. ?Target Date: 2022-09-21 Frequency: monthly ?Progress: 50 Modality: individual ? ?2.Identify and replace thoughts and beliefs that support depression. ?Target Date: 2022-09-21 Frequency: monthly ?Progress: 60 Modality: individual ? ?3.  Learn and implement behavioral strategies to overcome depression. ?Target Date: 2022-09-21 Frequency: monthly ?Progress: 60 Modality: individual ? ?4.  Learn and implement problem-solving and  decision-making skills. ?Target Date: 2022-09-21 Frequency: monthly ?Progress: 80 Modality: individual ? ? ?Interventions by Therapist:  CBT , insight oriented approach and problem solving therapy.  Treatment plan created with patient and patient approves plan. ? ?Vanissa Strength G Bird Swetz, LCSW ? ? ? ? ? ? ? ? ? ? ? ? ? ? ? ? ? ? ? ? ? ? ? ? ? ? ? ? ? ? ? ? ? ? ? ? ? ?Burney Calzadilla G Eri Platten, LCSW ? ? ? ? ? ? ? ? ? ? ? ? ? ? ?Camilla Skeen G Marea Reasner, LCSW ? ? ? ? ? ? ? ? ? ? ? ? ? ? ?Dessa Ledee G Rmani Kellogg, LCSW ?

## 2021-10-29 ENCOUNTER — Ambulatory Visit (INDEPENDENT_AMBULATORY_CARE_PROVIDER_SITE_OTHER): Payer: Medicare Other | Admitting: Psychology

## 2021-10-29 DIAGNOSIS — F331 Major depressive disorder, recurrent, moderate: Secondary | ICD-10-CM | POA: Diagnosis not present

## 2021-10-29 NOTE — Progress Notes (Signed)
[facility] Counselor/Therapist Progress Note ? ?Patient ID: Sylvia Maldonado, MRN: [id number],   ? ?Date: 10/29/2021 ? ?Time Spent: 50 minutes ? ?Treatment Type: Individual Therapy ? ?Reported Symptoms: depression ? ?Mental Status Exam: ?Appearance:  Casual     ?Behavior: Appropriate  ?Motor: Normal  ?Speech/Language:  Normal Rate  ?Affect: Appropriate  ?Mood: normal  ?Thought process: normal  ?Thought content:   WNL  ?Sensory/Perceptual disturbances:   WNL  ?Orientation: oriented to person, place, time/date, and situation  ?Attention: Good  ?Concentration: Good  ?Memory: WNL  ?Fund of knowledge:  Good  ?Insight:   Good  ?Judgment:  Good  ?Impulse Control: Good  ? ?Risk Assessment: ?Danger to Self:  No ?Self-injurious Behavior: No ?Danger to Others: No ?Duty to Warn:no ?Physical Aggression / Violence:No  ?Access to Firearms a concern: No  ?Gang Involvement:No  ? ?Subjective: The patient attended an individual therapy session via video visit.  The patient gave verbal consent for the session to be the on video on WebEx.  The patient was in her home alone and the therapist was in the office.  The patient presents with a flat affect and mood is anxious.  The patient reports that she was not able to return the mattress and box Springs that she purchased.  She got online today and was anxious and upset because she feels like she made the wrong decision.  I reframed everything for her and helped her understand that she is still doing okay and that she got a lot of what she wanted with the purchase of the mattress.  I was teaching her how to reframe those things for herself and to look at things in a different way as opposed to beating herself up.  I recommended that she take should and should not out of her vocabulary and try to replace that as those are negative self talk. ? ?Interventions: Cognitive Behavioral Therapy and Assertiveness/Communication ? ?Diagnosis:Major depressive disorder, recurrent  episode, moderate ([place]) ? ?Plan: Treatment Plan ? ?Strengths/Abilities:  Intelligent, insightful ? ?Treatment Preferences:  Outpatient Individual therapy ? ?Statement of Needs:  "I need a therapist to help me with my depression" ? ?Symptoms:  decreased motivation: (Status: improved). poor concentration: (Status: improved). poor sleep: (Status: improved). ? ?Problems Addressed:  Unipolar depression ? ?Goals: 1. New Goal Statement for Unipolar Depression ? ?Describe current and past experiences with depression including their impact on functioning and  ?attempts to resolve it. ?Target Date: 2022-09-21 Frequency: monthly ?Progress: 50 Modality: individual ? ?2.Identify and replace thoughts and beliefs that support depression. ?Target Date: 2022-09-21 Frequency: monthly ?Progress: 60 Modality: individual ? ?3.  Learn and implement behavioral strategies to overcome depression. ?Target Date: 2022-09-21 Frequency: monthly ?Progress: 60 Modality: individual ? ?4.  Learn and implement problem-solving and decision-making skills. ?Target Date: 2022-09-21 Frequency: monthly ?Progress: 80 Modality: individual ? ? ?Interventions by Therapist:  CBT , insight oriented approach and problem solving therapy.  Treatment plan created with patient and patient approves plan. ? ?Christl Fessenden G Levert Heslop, LCSW ? ? ? ? ? ? ? ? ? ? ? ? ? ? ? ? ? ? ? ? ? ? ? ? ? ? ? ? ? ? ? ? ? ? ? ? ? ?Gwenith Tschida G Zakar Brosch, LCSW ? ? ? ? ? ? ? ? ? ? ? ? ? ? ?Dawanda Mapel G Arion Morgan, LCSW ? ? ? ? ? ? ? ? ? ? ? ? ? ? ?Dayanna Pryce G Lajoya Dombek, LCSW ? ? ? ? ? ? ? ? ? ? ? ? ? ? ?  Myran Arcia G Khambrel Amsden, LCSW ?

## 2021-11-04 DIAGNOSIS — F339 Major depressive disorder, recurrent, unspecified: Secondary | ICD-10-CM | POA: Diagnosis not present

## 2021-11-04 DIAGNOSIS — R636 Underweight: Secondary | ICD-10-CM | POA: Diagnosis not present

## 2021-11-04 DIAGNOSIS — Z0000 Encounter for general adult medical examination without abnormal findings: Secondary | ICD-10-CM | POA: Diagnosis not present

## 2021-11-04 DIAGNOSIS — Z1389 Encounter for screening for other disorder: Secondary | ICD-10-CM | POA: Diagnosis not present

## 2021-11-04 DIAGNOSIS — Z85820 Personal history of malignant melanoma of skin: Secondary | ICD-10-CM | POA: Diagnosis not present

## 2021-11-04 DIAGNOSIS — N900 Mild vulvar dysplasia: Secondary | ICD-10-CM | POA: Diagnosis not present

## 2021-11-04 DIAGNOSIS — R634 Abnormal weight loss: Secondary | ICD-10-CM | POA: Diagnosis not present

## 2021-11-09 ENCOUNTER — Other Ambulatory Visit: Payer: Self-pay | Admitting: Family Medicine

## 2021-11-09 DIAGNOSIS — R634 Abnormal weight loss: Secondary | ICD-10-CM

## 2021-11-12 ENCOUNTER — Ambulatory Visit: Payer: Medicare Other | Admitting: Psychology

## 2021-11-26 ENCOUNTER — Ambulatory Visit (INDEPENDENT_AMBULATORY_CARE_PROVIDER_SITE_OTHER): Payer: Medicare Other | Admitting: Psychology

## 2021-11-26 DIAGNOSIS — F331 Major depressive disorder, recurrent, moderate: Secondary | ICD-10-CM

## 2021-11-26 NOTE — Progress Notes (Signed)
[facility] Counselor/Therapist Progress Note  Patient ID: Sylvia Maldonado, MRN: [id number],    Date: 11/26/2021  Time Spent: 50 minutes  Treatment Type: Individual Therapy  Reported Symptoms: depression  Mental Status Exam: Appearance:  Casual     Behavior: Appropriate  Motor: Normal  Speech/Language:  Normal Rate  Affect: Appropriate  Mood: normal  Thought process: normal  Thought content:   WNL  Sensory/Perceptual disturbances:   WNL  Orientation: oriented to person, place, time/date, and situation  Attention: Good  Concentration: Good  Memory: WNL  Fund of knowledge:  Good  Insight:   Good  Judgment:  Good  Impulse Control: Good   Risk Assessment: Danger to Self:  No Self-injurious Behavior: No Danger to Others: No Duty to Warn:no Physical Aggression / Violence:No  Access to Firearms a concern: No  Gang Involvement:No   Subjective: The patient attended an individual therapy session via video visit.  The patient gave verbal consent for the session to be the on video on WebEx.  The patient was in her home alone and the therapist was in the office.  The patient presents with a flat affect and mood is pleasant.  The patient reports that she went to the doctor and she is going for a test next week to see if she possibly has cancer.  We talked about this issue and she seems not to be real concerned about it.  I explained that I thought she probably just exercises and eats right and maybe that is why she has had the weight loss because this is why the doctor is referring her to this test.  I recommended that she stay in the pleasant present moment and not go too far ahead or too far behind.  The patient has been doing relatively well she says and she just returned from [place] [facility] with her daughter and she had a wonderful time.  She has decided that she is not returning to work because she feels like she does not want to continue to do that kind of work  anymore.  Encouraged the patient to contact me if she needs an appointment between her next one and this one. Interventions: Cognitive Behavioral Therapy and Assertiveness/Communication  Diagnosis:Major depressive disorder, recurrent episode, moderate ([facility])  Plan: Treatment Plan  Strengths/Abilities:  Intelligent, insightful  Treatment Preferences:  Outpatient Individual therapy  Statement of Needs:  "I need a therapist to help me with my depression"  Symptoms:  decreased motivation: (Status: improved). poor concentration: (Status: improved). poor sleep: (Status: improved).  Problems Addressed:  Unipolar depression  Goals: 1. New Goal Statement for Unipolar Depression  Describe current and past experiences with depression including their impact on functioning and  attempts to resolve it. Target Date: 2022-09-21 Frequency: monthly Progress: 50 Modality: individual  2.Identify and replace thoughts and beliefs that support depression. Target Date: 2022-09-21 Frequency: monthly Progress: 60 Modality: individual  3.  Learn and implement behavioral strategies to overcome depression. Target Date: 2022-09-21 Frequency: monthly Progress: 60 Modality: individual  4.  Learn and implement problem-solving and decision-making skills. Target Date: 2022-09-21 Frequency: monthly Progress: 80 Modality: individual   Interventions by Therapist:  CBT , insight oriented approach and problem solving therapy.  Treatment plan created with patient and patient approves plan.  Dana Dorner G Jodey Burbano, LCSW  Kosta Schnitzler G Tekla Malachowski, LCSW               Bonnye Halle G Adaliz Dobis, LCSW               Kazue Cerro G Aurther Harlin, LCSW               Cara Thaxton G Caelin Rosen, LCSW               Pixie Burgener G Mykell Rawl, LCSW

## 2021-12-02 ENCOUNTER — Ambulatory Visit
Admission: RE | Admit: 2021-12-02 | Discharge: 2021-12-02 | Disposition: A | Discharge status: Home / Self Care | Payer: Medicare Other | Source: Ambulatory Visit | Attending: Family Medicine | Admitting: Family Medicine

## 2021-12-02 DIAGNOSIS — R634 Abnormal weight loss: Secondary | ICD-10-CM

## 2021-12-02 DIAGNOSIS — R197 Diarrhea, unspecified: Secondary | ICD-10-CM | POA: Diagnosis not present

## 2021-12-02 DIAGNOSIS — D7389 Other diseases of spleen: Secondary | ICD-10-CM | POA: Diagnosis not present

## 2021-12-02 DIAGNOSIS — K314 Gastric diverticulum: Secondary | ICD-10-CM | POA: Diagnosis not present

## 2021-12-02 MED ORDER — IOPAMIDOL (ISOVUE-300) INJECTION 61%
100.0000 mL | Freq: Once | INTRAVENOUS | Status: AC | PRN
  Administered 2021-12-02: 100 mL via INTRAVENOUS

## 2021-12-09 DIAGNOSIS — I700 Atherosclerosis of aorta: Secondary | ICD-10-CM | POA: Diagnosis not present

## 2021-12-09 DIAGNOSIS — R634 Abnormal weight loss: Secondary | ICD-10-CM | POA: Diagnosis not present

## 2021-12-09 DIAGNOSIS — Z6820 Body mass index (BMI) 20.0-20.9, adult: Secondary | ICD-10-CM | POA: Diagnosis not present

## 2021-12-10 ENCOUNTER — Ambulatory Visit (INDEPENDENT_AMBULATORY_CARE_PROVIDER_SITE_OTHER): Payer: Medicare Other | Admitting: Psychology

## 2021-12-10 ENCOUNTER — Ambulatory Visit: Payer: Medicare Other | Admitting: Psychology

## 2021-12-10 DIAGNOSIS — F331 Major depressive disorder, recurrent, moderate: Secondary | ICD-10-CM

## 2021-12-10 NOTE — Progress Notes (Signed)
[facility] Counselor/Therapist Progress Note  Patient ID: Sylvia Maldonado, MRN: [id number],    Date: 12/10/2021  Time Spent: 45 minutes  Treatment Type: Individual Therapy  Reported Symptoms: depression  Mental Status Exam: Appearance:  Casual     Behavior: Appropriate  Motor: Normal  Speech/Language:  Normal Rate  Affect: Appropriate  Mood: normal  Thought process: normal  Thought content:   WNL  Sensory/Perceptual disturbances:   WNL  Orientation: oriented to person, place, time/date, and situation  Attention: Good  Concentration: Good  Memory: WNL  Fund of knowledge:  Good  Insight:   Good  Judgment:  Good  Impulse Control: Good   Risk Assessment: Danger to Self:  No Self-injurious Behavior: No Danger to Others: No Duty to Warn:no Physical Aggression / Violence:No  Access to Firearms a concern: No  Gang Involvement:No   Subjective: The patient attended an individual therapy session via video visit.  The patient gave verbal consent for the session to be the on video on WebEx.  The patient was in her home alone and the therapist was in the office.  The patient presents with a flat affect and mood is pleasant.  The patient reports that she went for the test at the doctor and she does not have cancer.  The patient states that she was relieved about this.  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  The patient continues to exercise and eat well and take care of herself and she reports that her depressive symptoms are okay.  The patient states that she sees Dr. Casimiro Needle next week in the office for medication check and she is doing well.  Interventions: Cognitive Behavioral Therapy and Assertiveness/Communication  Diagnosis:Major depressive disorder, recurrent episode, moderate ([facility])  Plan: Treatment Plan  Strengths/Abilities:  Intelligent,  insightful  Treatment Preferences:  Outpatient Individual therapy  Statement of Needs:  "I need a therapist to help me with my depression"  Symptoms:  decreased motivation: (Status: improved). poor concentration: (Status: improved). poor sleep: (Status: improved).  Problems Addressed:  Unipolar depression  Goals: 1. New Goal Statement for Unipolar Depression  Describe current and past experiences with depression including their impact on functioning and  attempts to resolve it. Target Date: 2022-09-21 Frequency: monthly Progress: 50 Modality: individual  2.Identify and replace thoughts and beliefs that support depression. Target Date: 2022-09-21 Frequency: monthly Progress: 60 Modality: individual  3.  Learn and implement behavioral strategies to overcome depression. Target Date: 2022-09-21 Frequency: monthly Progress: 60 Modality: individual  4.  Learn and implement problem-solving and decision-making skills. Target Date: 2022-09-21 Frequency: monthly Progress: 80 Modality: individual   Interventions by Therapist:  CBT , insight oriented approach and problem solving therapy.  Treatment plan created with patient and patient approves plan.  Nykeria Mealing G Prerna Harold, LCSW                                      Delancey Moraes G Madlynn Lundeen, LCSW               Wayden Schwertner G Merl Guardino, LCSW               Oneal Schoenberger G Orma Cheetham, LCSW               Lauramae Kneisley G Heather Streeper, LCSW  Yaiden Yang G Heber Hoog, LCSW               Samanthajo Payano G Iliany Losier, LCSW

## 2021-12-24 ENCOUNTER — Ambulatory Visit (INDEPENDENT_AMBULATORY_CARE_PROVIDER_SITE_OTHER): Payer: Medicare Other | Admitting: Psychology

## 2021-12-24 ENCOUNTER — Ambulatory Visit: Payer: Medicare Other | Admitting: Psychology

## 2021-12-24 DIAGNOSIS — F331 Major depressive disorder, recurrent, moderate: Secondary | ICD-10-CM | POA: Diagnosis not present

## 2021-12-24 NOTE — Progress Notes (Signed)
[facility] Counselor/Therapist Progress Note  Patient ID: Telitha Plath, MRN: [id number],    Date: 12/24/2021  Time Spent: 50 minutes  Treatment Type: Individual Therapy  Reported Symptoms: depression  Mental Status Exam: Appearance:  Casual     Behavior: Appropriate  Motor: Normal  Speech/Language:  Normal Rate  Affect: Appropriate  Mood: normal  Thought process: normal  Thought content:   WNL  Sensory/Perceptual disturbances:   WNL  Orientation: oriented to person, place, time/date, and situation  Attention: Good  Concentration: Good  Memory: WNL  Fund of knowledge:  Good  Insight:   Good  Judgment:  Good  Impulse Control: Good   Risk Assessment: Danger to Self:  No Self-injurious Behavior: No Danger to Others: No Duty to Warn:no Physical Aggression / Violence:No  Access to Firearms a concern: No  Gang Involvement:No   Subjective: The patient attended an individual therapy session via video visit.  The patient gave verbal consent for the session to be the on video on WebEx.  The patient was in her home alone and the therapist was in the office.  The patient presents with a flat affect and mood is pleasant.  The patient reports that she has high cholesterol and had been put on a statin but did not do very well with that.  They have decreased it to half a pill once a week.  She states that she is afraid she will not be able to take it.  The patient does a great job of eating in a healthy way and also exercising so this definitely seems to be a genetic issue.  We talked about her continuing to do what she needs to do to take care of herself and her also she is working some.  The patient talked about being interested in at some point moving back to [place].  I encouraged her to explore options in regard to this as she was very happy in [place].  We talked about how to possibly look for a place and also how to present it to her family members.  We will  continue to explore this as an option moving forward. Interventions: Cognitive Behavioral Therapy and Assertiveness/Communication  Diagnosis:No diagnosis found.  Plan: Treatment Plan  Strengths/Abilities:  Intelligent, insightful  Treatment Preferences:  Outpatient Individual therapy  Statement of Needs:  "I need a therapist to help me with my depression"  Symptoms:  decreased motivation: (Status: improved). poor concentration: (Status: improved). poor sleep: (Status: improved).  Problems Addressed:  Unipolar depression  Goals: 1. New Goal Statement for Unipolar Depression  Describe current and past experiences with depression including their impact on functioning and  attempts to resolve it. Target Date: 2022-09-21 Frequency: monthly Progress: 50 Modality: individual  2.Identify and replace thoughts and beliefs that support depression. Target Date: 2022-09-21 Frequency: monthly Progress: 60 Modality: individual  3.  Learn and implement behavioral strategies to overcome depression. Target Date: 2022-09-21 Frequency: monthly Progress: 60 Modality: individual  4.  Learn and implement problem-solving and decision-making skills. Target Date: 2022-09-21 Frequency: monthly Progress: 80 Modality: individual   Interventions by Therapist:  CBT , insight oriented approach and problem solving therapy.  Treatment plan created with patient and patient approves plan.  Nadyne Gariepy G Shantice Menger, LCSW                                      Corie Vavra G Claudie Brickhouse, LCSW  Elanie Hammitt G Siona Coulston, LCSW               Reverie Vaquera G Carsten Carstarphen, LCSW               Paulo Keimig G Kjirsten Bloodgood, LCSW               Nikaya Nasby G Adelie Croswell, LCSW               Rudra Hobbins G Marshun Duva, LCSW               Nyssa Sayegh G Lailie Smead, LCSW

## 2021-12-25 ENCOUNTER — Emergency Department (HOSPITAL_BASED_OUTPATIENT_CLINIC_OR_DEPARTMENT_OTHER): Payer: Medicare Other

## 2021-12-25 ENCOUNTER — Emergency Department (HOSPITAL_BASED_OUTPATIENT_CLINIC_OR_DEPARTMENT_OTHER)
Admission: EM | Admit: 2021-12-25 | Discharge: 2021-12-25 | Disposition: A | Discharge status: Home / Self Care | Payer: Medicare Other | Attending: Emergency Medicine | Admitting: Emergency Medicine

## 2021-12-25 ENCOUNTER — Encounter (HOSPITAL_BASED_OUTPATIENT_CLINIC_OR_DEPARTMENT_OTHER): Payer: Self-pay | Admitting: Emergency Medicine

## 2021-12-25 DIAGNOSIS — G4489 Other headache syndrome: Secondary | ICD-10-CM | POA: Insufficient documentation

## 2021-12-25 DIAGNOSIS — Z91040 Latex allergy status: Secondary | ICD-10-CM | POA: Diagnosis not present

## 2021-12-25 DIAGNOSIS — H9319 Tinnitus, unspecified ear: Secondary | ICD-10-CM | POA: Diagnosis not present

## 2021-12-25 DIAGNOSIS — R519 Headache, unspecified: Secondary | ICD-10-CM | POA: Diagnosis not present

## 2021-12-25 DIAGNOSIS — R42 Dizziness and giddiness: Secondary | ICD-10-CM | POA: Diagnosis not present

## 2021-12-25 HISTORY — DX: Hyperlipidemia, unspecified: E78.5

## 2021-12-25 LAB — COMPREHENSIVE METABOLIC PANEL
ALT: 11 U/L (ref 0–44)
AST: 23 U/L (ref 15–41)
Albumin: 4.6 g/dL (ref 3.5–5.0)
Alkaline Phosphatase: 74 U/L (ref 38–126)
Anion gap: 6 (ref 5–15)
BUN: 10 mg/dL (ref 8–23)
CO2: 27 mmol/L (ref 22–32)
Calcium: 9.4 mg/dL (ref 8.9–10.3)
Chloride: 104 mmol/L (ref 98–111)
Creatinine, Ser: 0.69 mg/dL (ref 0.44–1.00)
GFR, Estimated: >60 mL/min (ref >60)
Glucose, Bld: 94 mg/dL (ref 70–99)
Potassium: 3.7 mmol/L (ref 3.5–5.1)
Sodium: 137 mmol/L (ref 135–145)
Total Bilirubin: 1.7 mg/dL — ABNORMAL HIGH (ref 0.3–1.2)
Total Protein: 7 g/dL (ref 6.5–8.1)

## 2021-12-25 LAB — CBC
HCT: 35.8 % — ABNORMAL LOW (ref 36.0–46.0)
Hemoglobin: 11.9 g/dL — ABNORMAL LOW (ref 12.0–15.0)
MCH: 29.2 pg (ref 26.0–34.0)
MCHC: 33.2 g/dL (ref 30.0–36.0)
MCV: 87.7 fL (ref 80.0–100.0)
Platelets: 167 10*3/uL (ref 150–400)
RBC: 4.08 MIL/uL (ref 3.87–5.11)
RDW: 13.2 % (ref 11.5–15.5)
WBC: 4.4 10*3/uL (ref 4.0–10.5)
nRBC: 0 % (ref 0.0–0.2)

## 2021-12-25 NOTE — ED Notes (Signed)
Pt d/c home per MD order with visitor. Discharge summary reviewed, pt verbalizes understanding. Ambulatory off unit. No s/s of acute distress noted at discharge

## 2021-12-25 NOTE — ED Triage Notes (Signed)
Pt arrives POV ambualtory c/o headache x 3 days with BL tinnitus. Denies changes in vision, weakness. Endorses dizziness. Last dose of Ibuprofen at 4 pm. Recently dx with high cholesterol and placed on new med.

## 2021-12-25 NOTE — Discharge Instructions (Signed)
Consult with your doctor on Monday regarding your headaches and what to do about your cholesterol medication.  But for now stop taking it.  Return to the emergency room if you have any worsening symptoms.

## 2021-12-25 NOTE — ED Provider Notes (Signed)
[facility] EMERGENCY DEPARTMENT Provider Note   CSN: [id number] Arrival date & time: 12/25/21  1707     History  Chief Complaint  Patient presents with   Headache   Tinnitus    Sylvia Maldonado is a 81 y.o. female.  Patient is an 81 year old female who presents with a headache.  She said it started 3 days ago.  She recently started taking a new cholesterol medicine and she feels that may have been what started it.  It was on gradual onset.  Its all over her head.  She initially had some ringing in her ears but that has resolved.  She denies any vision changes.  No speech deficits.  No weakness in her arms or legs.  In fact she went to an exercise class this morning and did fine with that.  She denies any nausea or vomiting.  No photophobia.  No history of similar headaches in the past.  She did take an ibuprofen about 2 hours ago and says that does seem to be resolving the headache.  No associated neck pain.  No fevers.  No recent head trauma.       Home Medications Prior to Admission medications   Medication Sig Start Date End Date Taking? Authorizing Provider  ARIPiprazole (ABILIFY) 10 MG tablet 1  qam 08/12/21   Plovsky, Berneta Sages, MD  Cholecalciferol (VITAMIN D-3) 1000 UNITS CAPS Take 2,000 Units by mouth daily.    [provider]  citalopram (CELEXA) 20 MG tablet Take 1 tablet (20 mg total) by mouth daily. 09/03/21   Plovsky, Berneta Sages, MD  cycloSPORINE (RESTASIS) 0.05 % ophthalmic emulsion Place 1 drop into both eyes daily.     [provider]  gabapentin (NEURONTIN) 100 MG capsule Take 100 mg by mouth at bedtime. 09/18/21   [provider]  loteprednol (LOTEMAX) 0.5 % ophthalmic suspension 1 drop into affected eye    [provider]  Magnesium 200 MG TABS 1 tablet with a meal    [provider]  [facility] injection  06/11/20   [provider]  Turmeric 500 MG CAPS 1 capsule    [provider]  vitamin B-12  (CYANOCOBALAMIN) 1000 MCG tablet Take 1,000 mcg by mouth daily.    [provider]      Allergies    Doxycycline, Neosporin [neomycin-bacitracin zn-polymyx], Penicillins, Sulfa antibiotics, Latex, Prednisone, Acetaminophen, Amprenavir, Aripiprazole, Bupropion, Darunavir, Eggs or egg-derived products, Sulfur, Venlafaxine, and Zoster vac recomb adjuvanted    Review of Systems   Review of Systems  Constitutional:  Negative for chills, diaphoresis, fatigue and fever.  HENT:  Positive for tinnitus. Negative for congestion, rhinorrhea and sneezing.   Eyes: Negative.   Respiratory:  Negative for cough, chest tightness and shortness of breath.   Cardiovascular:  Negative for chest pain and leg swelling.  Gastrointestinal:  Negative for abdominal pain, blood in stool, diarrhea, nausea and vomiting.  Genitourinary:  Negative for difficulty urinating, flank pain, frequency and hematuria.  Musculoskeletal:  Negative for arthralgias and back pain.  Skin:  Negative for rash.  Neurological:  Positive for headaches. Negative for dizziness, speech difficulty, weakness and numbness.    Physical Exam Updated Vital Signs BP (!) 138/54   Pulse (!) 56   Temp 98.2 F (36.8 C) (Oral)   Resp 16   Ht '5\' 5"'$  (1.651 m)   Wt 54 kg   SpO2 98%   BMI 19.80 kg/m  Physical Exam Constitutional:      Appearance:  She is well-developed.  HENT:     Head: Normocephalic and atraumatic.  Eyes:     Pupils: Pupils are equal, round, and reactive to light.  Cardiovascular:     Rate and Rhythm: Normal rate and regular rhythm.     Heart sounds: Normal heart sounds.  Pulmonary:     Effort: Pulmonary effort is normal. No respiratory distress.     Breath sounds: Normal breath sounds. No wheezing or rales.  Chest:     Chest wall: No tenderness.  Abdominal:     General: Bowel sounds are normal.     Palpations: Abdomen is soft.     Tenderness: There is no abdominal tenderness. There is no guarding or rebound.   Musculoskeletal:        General: Normal range of motion.     Cervical back: Normal range of motion and neck supple.  Lymphadenopathy:     Cervical: No cervical adenopathy.  Skin:    General: Skin is warm and dry.     Findings: No rash.  Neurological:     Mental Status: She is alert and oriented to person, place, and time.     Comments: Motor 5/5 all extremities Sensation grossly intact to LT all extremities Finger to Nose intact, no pronator drift CN II-XII grossly intact       ED Results / Procedures / Treatments   Labs (all labs ordered are listed, but only abnormal results are displayed) Labs Reviewed  COMPREHENSIVE METABOLIC PANEL - Abnormal; Notable for the following components:      Result Value   Total Bilirubin 1.7 (*)    All other components within normal limits  CBC - Abnormal; Notable for the following components:   Hemoglobin 11.9 (*)    HCT 35.8 (*)    All other components within normal limits    EKG None  Radiology CT Head Wo Contrast  Result Date: 12/25/2021 CLINICAL DATA:  Headache, tinnitus dizziness EXAM: CT HEAD WITHOUT CONTRAST TECHNIQUE: Contiguous axial images were obtained from the base of the skull through the vertex without intravenous contrast. RADIATION DOSE REDUCTION: This exam was performed according to the departmental dose-optimization program which includes automated exposure control, adjustment of the mA and/or kV according to patient size and/or use of iterative reconstruction technique. COMPARISON:  No prior MRI, correlation is made with MRI head 08/03/2018 FINDINGS: Brain: No evidence of acute infarction, hemorrhage, cerebral edema, mass, mass effect, or midline shift. No hydrocephalus or extra-axial fluid collection. Cerebral volume is within normal limits for age. Vascular: No hyperdense vessel. Skull: Normal. Negative for fracture or focal lesion. Sinuses/Orbits: No acute finding. Status post bilateral lens replacements. Other: The  mastoid air cells are well aerated. IMPRESSION: No acute intracranial process. No etiology is seen for the patient's symptoms. Electronically Signed   By: Merilyn Baba M.D.   On: 12/25/2021 17:38    Procedures Procedures    Medications Ordered in ED Medications - No data to display  ED Course/ Medical Decision Making/ A&P                           Medical Decision Making Amount and/or Complexity of Data Reviewed Labs: ordered. Radiology: ordered.   Patient is a 81 year old female who presents with a headache that started after taking a lipid-lowering medication.  It does appear that headaches are a side effect of this medication.  She initially had some tinnitus but does not have that any longer.  She does not have any neurologic deficits or recent trauma.  No signs of a stroke.  She had a CT scan of her head which showed no acute abnormalities.  No evidence of intracranial hemorrhage or mass.  Her labs are nonconcerning.  She does not have any meningeal signs.  No symptoms that would be more concerning for subarachnoid hemorrhage.  She is feeling better after taking ibuprofen.  At this point I do not feel any indication for further testing or hospitalization.  She was discharged home in good condition.  She has stopped taking her lipid-lowering medication and will consult her doctor on Monday regarding what to do about this.  Return precautions were given.  Final Clinical Impression(s) / ED Diagnoses Final diagnoses:  Other headache syndrome    Rx / DC Orders ED Discharge Orders     None         Malvin Johns, MD 12/25/21 2026

## 2022-01-01 ENCOUNTER — Ambulatory Visit (INDEPENDENT_AMBULATORY_CARE_PROVIDER_SITE_OTHER): Payer: Medicare Other | Admitting: Podiatry

## 2022-01-01 ENCOUNTER — Encounter: Payer: Self-pay | Admitting: Podiatry

## 2022-01-01 DIAGNOSIS — M216X2 Other acquired deformities of left foot: Secondary | ICD-10-CM

## 2022-01-01 DIAGNOSIS — M79675 Pain in left toe(s): Secondary | ICD-10-CM

## 2022-01-01 DIAGNOSIS — M2010 Hallux valgus (acquired), unspecified foot: Secondary | ICD-10-CM

## 2022-01-01 DIAGNOSIS — L84 Corns and callosities: Secondary | ICD-10-CM | POA: Diagnosis not present

## 2022-01-01 DIAGNOSIS — M79674 Pain in right toe(s): Secondary | ICD-10-CM | POA: Diagnosis not present

## 2022-01-01 DIAGNOSIS — B351 Tinea unguium: Secondary | ICD-10-CM

## 2022-01-01 DIAGNOSIS — M2040 Other hammer toe(s) (acquired), unspecified foot: Secondary | ICD-10-CM

## 2022-01-01 DIAGNOSIS — M216X1 Other acquired deformities of right foot: Secondary | ICD-10-CM

## 2022-01-05 DIAGNOSIS — Z6820 Body mass index (BMI) 20.0-20.9, adult: Secondary | ICD-10-CM | POA: Diagnosis not present

## 2022-01-05 DIAGNOSIS — G4452 New daily persistent headache (NDPH): Secondary | ICD-10-CM | POA: Diagnosis not present

## 2022-01-07 ENCOUNTER — Ambulatory Visit (INDEPENDENT_AMBULATORY_CARE_PROVIDER_SITE_OTHER): Payer: Medicare Other | Admitting: Psychology

## 2022-01-07 ENCOUNTER — Ambulatory Visit: Payer: Medicare Other | Admitting: Psychology

## 2022-01-07 DIAGNOSIS — F331 Major depressive disorder, recurrent, moderate: Secondary | ICD-10-CM | POA: Diagnosis not present

## 2022-01-07 NOTE — Progress Notes (Signed)
[facility] Counselor/Therapist Progress Note  Patient ID: Avari Nevares, MRN: [id number],    Date: 01/07/2022  Time Spent: 50 minutes  Treatment Type: Individual Therapy  Reported Symptoms: depression  Mental Status Exam: Appearance:  Casual     Behavior: Appropriate  Motor: Normal  Speech/Language:  Normal Rate  Affect: Appropriate  Mood: normal  Thought process: normal  Thought content:   WNL  Sensory/Perceptual disturbances:   WNL  Orientation: oriented to person, place, time/date, and situation  Attention: Good  Concentration: Good  Memory: WNL  Fund of knowledge:  Good  Insight:   Good  Judgment:  Good  Impulse Control: Good   Risk Assessment: Danger to Self:  No Self-injurious Behavior: No Danger to Others: No Duty to Warn:no Physical Aggression / Violence:No  Access to Firearms a concern: No  Gang Involvement:No   Subjective: The patient attended an individual therapy session via video visit.  The patient gave verbal consent for the session to be the on video on WebEx.  The patient was in her home alone and the therapist was in the office.  The patient presents with a flat affect and mood is pleasant.  The patient reports that she has had a headache for quite a while now.  She reports that she had to go to the emergency room twice and they have not found anything that would be causing the headache.  Her daughters feel like she is not getting enough protein in her diet.  We discussed how she eats and it does not seem like she is getting enough food.  We talked about ways that she can increase her protein and also get enough calories in for the day.  Provided supportive therapy and encouraged her to do what she can to improve her appetite.  The patient does like to be thin and it seems that she may be taking this to an extreme.  We will continue to monitor and encourage her to change some things about her diet.  Interventions: Cognitive Behavioral  Therapy and Assertiveness/Communication  Diagnosis:Major depressive disorder, recurrent episode, moderate (HCC)  Plan: Treatment Plan  Strengths/Abilities:  Intelligent, insightful  Treatment Preferences:  Outpatient Individual therapy  Statement of Needs:  "I need a therapist to help me with my depression"  Symptoms:  decreased motivation: (Status: improved). poor concentration: (Status: improved). poor sleep: (Status: improved).  Problems Addressed:  Unipolar depression  Goals: 1. New Goal Statement for Unipolar Depression  Describe current and past experiences with depression including their impact on functioning and  attempts to resolve it. Target Date: 2022-09-21 Frequency: monthly Progress: 50 Modality: individual  2.Identify and replace thoughts and beliefs that support depression. Target Date: 2022-09-21 Frequency: monthly Progress: 60 Modality: individual  3.  Learn and implement behavioral strategies to overcome depression. Target Date: 2022-09-21 Frequency: monthly Progress: 60 Modality: individual  4.  Learn and implement problem-solving and decision-making skills. Target Date: 2022-09-21 Frequency: monthly Progress: 80 Modality: individual   Interventions by Therapist:  CBT , insight oriented approach and problem solving therapy.  Treatment plan created with patient and patient approves plan.  Hermon Zea G Terra Aveni, LCSW                                      Dearra Myhand G Catori Panozzo, LCSW               Fumiye Lubben G Teresita Fanton, LCSW  Chasta Deshpande G Rashee Marschall, LCSW               Coalton Arch G Leopoldo Mazzie, LCSW               Adom Schoeneck G Shallyn Constancio, LCSW               Caleesi Kohl G Domino Holten, LCSW               Delorus Langwell G Francys Bolin, LCSW               Arabia Nylund G Evaristo Tsuda, LCSW

## 2022-01-13 ENCOUNTER — Telehealth (HOSPITAL_BASED_OUTPATIENT_CLINIC_OR_DEPARTMENT_OTHER): Payer: Medicare Other | Admitting: Psychiatry

## 2022-01-13 DIAGNOSIS — F325 Major depressive disorder, single episode, in full remission: Secondary | ICD-10-CM

## 2022-01-13 MED ORDER — ARIPIPRAZOLE 10 MG PO TABS: ORAL_TABLET | ORAL | 2 refills | Status: DC

## 2022-01-13 MED ORDER — CITALOPRAM HYDROBROMIDE 20 MG PO TABS: 20.0000 mg | ORAL_TABLET | Freq: Every day | ORAL | 1 refills | Status: DC

## 2022-01-13 NOTE — Progress Notes (Signed)
Patient ID: Sylvia Maldonado, female   DOB: [birth year], 81 y.o.   MRN: [id number] [facility] MD Progress Note  01/13/2022 1:49 PM Sylvia Maldonado  MRN:  [id number] Subjective: I am feel awful; I feel depressed. Principal Problem: Major depression in remission Diagnosis: Major depression recurrent    Today the patient is doing well.  Her health is fairly good.  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  The patient feels healthy.  She has some good friends.  She continues in one-to-one therapy with Bambi Cottle.  Her therapy is very successful.  Her health is good.  She had some issues with cholesterol medicines but she has decided not to take these medicines.  The patient does not smoke and she has few other cardiovascular risk factors.  She feels good what she terms of 25 years of age.  Her kids are all stable.  Life is good for her.  She takes her medicines just as prescribed.  Her next visit she will come to see me in person so we can do an aims scale.  At this time she denies any descriptions of tardive dyskinesia. Past Surgical History:  Procedure Laterality Date   cataracts surg     COLONOSCOPY     EYE SURGERY     PELVIC FLOOR REPAIR     TONSILLECTOMY     VAGINAL HYSTERECTOMY     Family History:  Family History  Problem Relation Age of Onset   CAD Neg Hx    Family Psychiatric  History:  Social History:  Social History   Substance and Sexual Activity  Alcohol Use No     Social History   Substance and Sexual Activity  Drug Use No    Social History   Socioeconomic History   Marital status: Single    Spouse name: Not on file   Number of children: Not on file   Years of education: Not on file   Highest education level: Not on file  Occupational History   Not on file  Tobacco Use   Smoking status: Never   Smokeless tobacco: Never  Vaping Use   Vaping Use: Never used  Substance and Sexual Activity   Alcohol use: No   Drug use: No    Sexual activity: Not Currently  Other Topics Concern   Not on file  Social History Narrative   Right handed    Lives alone at home    No caffeine usage    Social Determinants of Health   Financial Resource Strain: Not on file  Food Insecurity: Not on file  Transportation Needs: Not on file  Physical Activity: Not on file  Stress: Not on file  Social Connections: Not on file   Additional Social History:                         Sleep: Good  Appetite:  Good  Current Medications: Current Outpatient Medications  Medication Sig Dispense Refill   ARIPiprazole (ABILIFY) 10 MG tablet 1  qam 90 tablet 2   Cholecalciferol (VITAMIN D-3) 1000 UNITS CAPS Take 2,000 Units by mouth daily.     citalopram (CELEXA) 20 MG tablet Take 1 tablet (20 mg total) by mouth daily. 90 tablet 1   cycloSPORINE (RESTASIS) 0.05 % ophthalmic emulsion Place 1 drop into both eyes daily.      gabapentin (NEURONTIN) 100 MG capsule Take 100 mg by mouth at bedtime.  loteprednol (LOTEMAX) 0.5 % ophthalmic suspension 1 drop into affected eye     Magnesium 200 MG TABS 1 tablet with a meal     SHINGRIX injection      Turmeric 500 MG CAPS 1 capsule     vitamin B-12 (CYANOCOBALAMIN) 1000 MCG tablet Take 1,000 mcg by mouth daily.     No current facility-administered medications for this visit.    Lab Results: No results found for this or any previous visit (from the past 48 hour(s)).  Physical Findings: AIMS:  , ,  ,  ,    CIWA:    COWS:     Musculoskeletal: Strength & Muscle Tone: within normal limits Gait & Station: normal Patient leans: Right  Psychiatric Specialty Exam: ROS  There were no vitals taken for this visit.There is no height or weight on file to calculate BMI.  General Appearance: Casual  Eye Contact::  Good  Speech:  Clear and Coherent  Volume:  Normal  Mood:  NA  Affect:  Congruent  Thought Process:  Coherent  Orientation:  Full (Time, Place, and Person)  Thought Content:   WDL  Suicidal Thoughts:  No  Homicidal Thoughts:  No  Memory:  NA  Judgement:  NA  Insight:  Good  Psychomotor Activity:  Normal  Concentration:  Good  Recall:  Good  Fund of Knowledge:Good  Language: Good  Akathisia:  No  Handed:  Right  AIMS (if indicated):     Assets:  Desire for Improvement  ADL's:  Intact  Cognition: WNL  Sleep:       [name] 01/13/2022, 1:49 PM    Today the patient is doing well.  Her diagnosis is major depression.  She takes Celexa 20 mg and Abilify 10 mg.  She is been on this for years and has done very well.  Efforts to reduce her Abilify has led to a decline.  We concluded that we will monitor her closely and she will continue getting good medical care but will continue the Abilify as prescribed.  We will unlikely make any changes.  The patient will return to see me in 5 months and at that time we will do an aims scale.  Her mood is stable and she is functioning great. Virtual Visit via Telephone Note  I connected with Sylvia Maldonado on 01/13/22 at  1:30 PM EDT by telephone and verified that I am speaking with the correct person using two identifiers.  Location: Patient: home Provider: office   I discussed the limitations, risks, security and privacy concerns of performing an evaluation and management service by telephone and the availability of in person appointments. I also discussed with the patient that there may be a patient responsible charge related to this service. The patient expressed understanding and agreed to proceed.      I discussed the assessment and treatment plan with the patient. The patient was provided an opportunity to ask questions and all were answered. The patient agreed with the plan and demonstrated an understanding of the instructions.   The patient was advised to call back or seek an in-person evaluation if the symptoms worsen or if the condition fails to improve as anticipated.  I provided 30 minutes of  non-face-to-face time during this encounter.   [name], MD

## 2022-01-21 ENCOUNTER — Ambulatory Visit (INDEPENDENT_AMBULATORY_CARE_PROVIDER_SITE_OTHER): Payer: Medicare Other | Admitting: Psychology

## 2022-01-21 ENCOUNTER — Ambulatory Visit: Payer: Medicare Other | Admitting: Psychology

## 2022-01-21 DIAGNOSIS — F325 Major depressive disorder, single episode, in full remission: Secondary | ICD-10-CM | POA: Diagnosis not present

## 2022-01-21 NOTE — Progress Notes (Signed)
[facility] Counselor/Therapist Progress Note  Patient ID: Sylvia Maldonado, MRN: [id number],    Date: 01/21/2022  Time Spent: 50 minutes  Treatment Type: Individual Therapy  Reported Symptoms: depression  Mental Status Exam: Appearance:  Casual     Behavior: Appropriate  Motor: Normal  Speech/Language:  Normal Rate  Affect: Appropriate  Mood: normal  Thought process: normal  Thought content:   WNL  Sensory/Perceptual disturbances:   WNL  Orientation: oriented to person, place, time/date, and situation  Attention: Good  Concentration: Good  Memory: WNL  Fund of knowledge:  Good  Insight:   Good  Judgment:  Good  Impulse Control: Good   Risk Assessment: Danger to Self:  No Self-injurious Behavior: No Danger to Others: No Duty to Warn:no Physical Aggression / Violence:No  Access to Firearms a concern: No  Gang Involvement:No   Subjective: The patient attended an individual therapy session via video visit.  The patient gave verbal consent for the session to be the on video on WebEx.  The patient was in her home alone and the therapist was in the office.  The patient presents with a flat affect and mood is pleasant.   The patient reports that she has decided to fully retire from her job at [employer].  The patient states that she is tired and she just does not want to do it anymore.  The patient states that she feels better because her headache is gone and she probably was not getting enough food.  We talked about what she is doing to take care of herself and she continues to walk and eat right and she is getting some social interaction at the gym.  Provided supportive therapy and work with patient on being positive about the changes that she has made.  Interventions: Cognitive Behavioral Therapy and Assertiveness/Communication  Diagnosis:Major depressive disorder with single episode, in full remission [facility])  Plan: Treatment  Plan  Strengths/Abilities:  Intelligent, insightful  Treatment Preferences:  Outpatient Individual therapy  Statement of Needs:  "I need a therapist to help me with my depression"  Symptoms:  decreased motivation: (Status: improved). poor concentration: (Status: improved). poor sleep: (Status: improved).  Problems Addressed:  Unipolar depression  Goals: 1. New Goal Statement for Unipolar Depression  Describe current and past experiences with depression including their impact on functioning and  attempts to resolve it. Target Date: 2022-09-21 Frequency: monthly Progress: 50 Modality: individual  2.Identify and replace thoughts and beliefs that support depression. Target Date: 2022-09-21 Frequency: monthly Progress: 60 Modality: individual  3.  Learn and implement behavioral strategies to overcome depression. Target Date: 2022-09-21 Frequency: monthly Progress: 60 Modality: individual  4.  Learn and implement problem-solving and decision-making skills. Target Date: 2022-09-21 Frequency: monthly Progress: 80 Modality: individual   Interventions by Therapist:  CBT , insight oriented approach and problem solving therapy.  Treatment plan created with patient and patient approves plan.  Jalexa Pifer G Milley Vining, LCSW                                      Lashaunta Sicard G Devanny Palecek, LCSW               Arlissa Monteverde G Monserrat Vidaurri, LCSW               Stanely Sexson G Santana Edell, LCSW               Jacub Waiters G Ankith Edmonston, LCSW  Jerone Cudmore G Pallie Swigert, LCSW               Wiatt Mahabir G Dicky Boer, LCSW               Rozella Servello G Tery Hoeger, LCSW               Shavonne Ambroise G Misaki Sozio, LCSW               Kedra Mcglade G Tymeka Privette, LCSW

## 2022-02-04 ENCOUNTER — Ambulatory Visit: Payer: Medicare Other | Admitting: Psychology

## 2022-02-04 ENCOUNTER — Ambulatory Visit (INDEPENDENT_AMBULATORY_CARE_PROVIDER_SITE_OTHER): Payer: Medicare Other | Admitting: Psychology

## 2022-02-04 ENCOUNTER — Ambulatory Visit: Payer: Medicare Other | Admitting: Podiatry

## 2022-02-04 DIAGNOSIS — F325 Major depressive disorder, single episode, in full remission: Secondary | ICD-10-CM | POA: Diagnosis not present

## 2022-02-04 NOTE — Progress Notes (Signed)
[place] Counselor/Therapist Progress Note  Patient ID: Sylvia Maldonado, MRN: [id number],    Date: 02/04/2022  Time Spent: 60 minutes  Treatment Type: Individual Therapy  Reported Symptoms: depression  Mental Status Exam: Appearance:  Casual     Behavior: Appropriate  Motor: Normal  Speech/Language:  Normal Rate  Affect: Appropriate  Mood: normal  Thought process: normal  Thought content:   WNL  Sensory/Perceptual disturbances:   WNL  Orientation: oriented to person, place, time/date, and situation  Attention: Good  Concentration: Good  Memory: WNL  Fund of knowledge:  Good  Insight:   Good  Judgment:  Good  Impulse Control: Good   Risk Assessment: Danger to Self:  No Self-injurious Behavior: No Danger to Others: No Duty to Warn:no Physical Aggression / Violence:No  Access to Firearms a concern: No  Gang Involvement:No   Subjective: The patient attended an individual therapy session via video visit.  The patient gave verbal consent for the session to be the on video on WebEx.  The patient was in her home alone and the therapist was in the office.  The patient presents with a flat affect and mood is pleasant.  The patient reports that she is doing well and is walking and exercising daily.  She loves to exercise.  We talked today about making sure that she has a conversation with her daughter about how she wants to be cared for when the time comes.  Discussed the concerns about not being prepared for how to take care of things.  Talked about her feeling more in control if she is able to do this.  She does feel that this is a little scary, but I explained that it would be more scary if she didn't do anything.  Interventions: Cognitive Behavioral Therapy and Assertiveness/Communication  Diagnosis:Major depressive disorder with single episode, in full remission [facility])  Plan: Treatment Plan  Strengths/Abilities:  Intelligent, insightful  Treatment  Preferences:  Outpatient Individual therapy  Statement of Needs:  "I need a therapist to help me with my depression"  Symptoms:  decreased motivation: (Status: improved). poor concentration: (Status: improved). poor sleep: (Status: improved).  Problems Addressed:  Unipolar depression  Goals: 1. New Goal Statement for Unipolar Depression  Describe current and past experiences with depression including their impact on functioning and  attempts to resolve it. Target Date: 2022-09-21 Frequency: monthly Progress: 50 Modality: individual  2.Identify and replace thoughts and beliefs that support depression. Target Date: 2022-09-21 Frequency: monthly Progress: 60 Modality: individual  3.  Learn and implement behavioral strategies to overcome depression. Target Date: 2022-09-21 Frequency: monthly Progress: 60 Modality: individual  4.  Learn and implement problem-solving and decision-making skills. Target Date: 2022-09-21 Frequency: monthly Progress: 80 Modality: individual   Interventions by Therapist:  CBT , insight oriented approach and problem solving therapy.  Treatment plan created with patient and patient approves plan.  Dailan Pfalzgraf G Angas Isabell, LCSW                                      Langley Flatley G Kala Gassmann, LCSW               Hareem Surowiec G Hope Holst, LCSW               Kee Drudge G Lagena Strand, LCSW               Mika Anastasi G Reisa Coppola, LCSW  Lovelyn Sheeran G Doria Fern, LCSW               Onie Kasparek G Dymond Spreen, LCSW               Silver Parkey G Aubriegh Minch, LCSW               Omario Ander G Clois Montavon, LCSW               Tyrez Berrios G Jazelyn Sipe, LCSW               Vane Yapp G Julene Rahn, LCSW

## 2022-02-05 ENCOUNTER — Ambulatory Visit: Payer: Medicare Other | Admitting: Podiatry

## 2022-02-18 ENCOUNTER — Ambulatory Visit: Payer: Medicare Other | Admitting: Psychology

## 2022-02-18 ENCOUNTER — Ambulatory Visit (INDEPENDENT_AMBULATORY_CARE_PROVIDER_SITE_OTHER): Payer: Medicare Other | Admitting: Psychology

## 2022-02-18 DIAGNOSIS — F325 Major depressive disorder, single episode, in full remission: Secondary | ICD-10-CM | POA: Diagnosis not present

## 2022-02-18 NOTE — Progress Notes (Signed)
[place] Counselor/Therapist Progress Note  Patient ID: Sylvia Maldonado, MRN: [id number],    Date: 02/18/2022  Time Spent: 45 minutes  Treatment Type: Individual Therapy  Reported Symptoms: depression  Mental Status Exam: Appearance:  Casual     Behavior: Appropriate  Motor: Normal  Speech/Language:  Normal Rate  Affect: Appropriate  Mood: normal  Thought process: normal  Thought content:   WNL  Sensory/Perceptual disturbances:   WNL  Orientation: oriented to person, place, time/date, and situation  Attention: Good  Concentration: Good  Memory: WNL  Fund of knowledge:  Good  Insight:   Good  Judgment:  Good  Impulse Control: Good   Risk Assessment: Danger to Self:  No Self-injurious Behavior: No Danger to Others: No Duty to Warn:no Physical Aggression / Violence:No  Access to Firearms a concern: No  Gang Involvement:No   Subjective: The patient attended an individual therapy session via video visit.  The patient gave verbal consent for the session to be the on video on WebEx.  The patient was in her home alone and the therapist was in the office.  The patient presents with a flat affect and mood is pleasant.  The patient reports that she has not been feeling depressed.  She continues to walk and exercise on a daily basis.  She feels better now that she is not working and does not seem to miss working anymore.  We talked about her continuing to take care of herself and do what she needs to with exercise and diet.  She reports that she has not had any depression.  Encouraged her to do what she can to socialize when she can.  The patient is maintaining well. Interventions: Cognitive Behavioral Therapy and Assertiveness/Communication  Diagnosis:Major depressive disorder with single episode, in full remission [facility])  Plan: Treatment Plan  Strengths/Abilities:  Intelligent, insightful  Treatment Preferences:  Outpatient Individual therapy  Statement  of Needs:  "I need a therapist to help me with my depression"  Symptoms:  decreased motivation: (Status: improved). poor concentration: (Status: improved). poor sleep: (Status: improved).  Problems Addressed:  Unipolar depression  Goals: 1. New Goal Statement for Unipolar Depression  Describe current and past experiences with depression including their impact on functioning and  attempts to resolve it. Target Date: 2022-09-21 Frequency: monthly Progress: 60 Modality: individual  2.Identify and replace thoughts and beliefs that support depression. Target Date: 2022-09-21 Frequency: monthly Progress: 70 Modality: individual  3.  Learn and implement behavioral strategies to overcome depression. Target Date: 2022-09-21 Frequency: monthly Progress: 70 Modality: individual  4.  Learn and implement problem-solving and decision-making skills. Target Date: 2022-09-21 Frequency: monthly Progress: 90 Modality: individual   Interventions by Therapist:  CBT , insight oriented approach and problem solving therapy.  Treatment plan created with patient and patient approves plan.  Jenina Moening G Bryssa Tones, LCSW                                      Ahad Colarusso G Deshay Blumenfeld, LCSW               Isaac Dubie G July Linam, LCSW               Tuleen Mandelbaum G Keelen Quevedo, LCSW               Babak Lucus G Jayvian Escoe, LCSW               Colie Fugitt G Zyria Fiscus, LCSW  Jeraline Marcinek G Bayden Gil, LCSW               Icela Glymph G Vanshika Jastrzebski, LCSW               Borghild Thaker G Serria Sloma, LCSW               Dafina Suk G Latroy Gaymon, LCSW               Latissa Frick G Tabby Beaston, LCSW               Charlea Nardo G Kataya Guimont, LCSW

## 2022-03-04 ENCOUNTER — Ambulatory Visit (INDEPENDENT_AMBULATORY_CARE_PROVIDER_SITE_OTHER): Payer: Medicare Other | Admitting: Psychology

## 2022-03-04 ENCOUNTER — Ambulatory Visit: Payer: Medicare Other | Admitting: Psychology

## 2022-03-04 DIAGNOSIS — F325 Major depressive disorder, single episode, in full remission: Secondary | ICD-10-CM | POA: Diagnosis not present

## 2022-03-04 NOTE — Progress Notes (Signed)
[place] Counselor/Therapist Progress Note  Patient ID: Tariah Transue, MRN: [id number],    Date: 02/18/2022  Time Spent: 50 minutes  Treatment Type: Individual Therapy  Reported Symptoms: depression  Mental Status Exam: Appearance:  Casual     Behavior: Appropriate  Motor: Normal  Speech/Language:  Normal Rate  Affect: Appropriate  Mood: normal  Thought process: normal  Thought content:   WNL  Sensory/Perceptual disturbances:   WNL  Orientation: oriented to person, place, time/date, and situation  Attention: Good  Concentration: Good  Memory: WNL  Fund of knowledge:  Good  Insight:   Good  Judgment:  Good  Impulse Control: Good   Risk Assessment: Danger to Self:  No Self-injurious Behavior: No Danger to Others: No Duty to Warn:no Physical Aggression / Violence:No  Access to Firearms a concern: No  Gang Involvement:No   Subjective: The patient attended an individual therapy session via video visit.  The patient gave verbal consent for the session to be the on video on WebEx.  The patient was in her home alone and the therapist was in the office.  The patient presents with a flat affect and mood is pleasant.  The patient states that she has been doing okay.  The patient reports that she has been walking and taking care of herself as usual.  The patient states that she was able to do something nice with her granddaughter for her birthday recently and that was very pleasant.  The patient seems to be doing well and tends to do better in the summer because the sun is out longer.  The patient has no specific concerns at present.  We will continue to do maintenance sessions as patient has a tendency to get more and more depressed in the fall and the winter.  Interventions: Cognitive Behavioral Therapy and Assertiveness/Communication  Diagnosis:Major depressive disorder with single episode, in full remission [facility])  Plan: Treatment  Plan  Strengths/Abilities:  Intelligent, insightful  Treatment Preferences:  Outpatient Individual therapy  Statement of Needs:  "I need a therapist to help me with my depression"  Symptoms:  decreased motivation: (Status: improved). poor concentration: (Status: improved). poor sleep: (Status: improved).  Problems Addressed:  Unipolar depression  Goals: 1. New Goal Statement for Unipolar Depression  Describe current and past experiences with depression including their impact on functioning and  attempts to resolve it. Target Date: 2022-09-21 Frequency: monthly Progress: 60 Modality: individual  2.Identify and replace thoughts and beliefs that support depression. Target Date: 2022-09-21 Frequency: monthly Progress: 70 Modality: individual  3.  Learn and implement behavioral strategies to overcome depression. Target Date: 2022-09-21 Frequency: monthly Progress: 70 Modality: individual  4.  Learn and implement problem-solving and decision-making skills. Target Date: 2022-09-21 Frequency: monthly Progress: 90 Modality: individual   Interventions by Therapist:  CBT , insight oriented approach and problem solving therapy.  Treatment plan created with patient and patient approves plan.  Danie Diehl G Nnamdi Dacus, LCSW                                      Dewane Timson G Safaa Stingley, LCSW               Ron Junco G Amariyah Bazar, LCSW               Avish Torry G Amayra Kiedrowski, LCSW               Koreena Joost G Yoneko Talerico, LCSW  Cordarro Spinnato G Yovanny Coats, LCSW               Raffi Milstein G Othel Hoogendoorn, LCSW               Eann Cleland G Jarious Lyon, LCSW               Aurilla Coulibaly G Izick Gasbarro, LCSW               Nava Song G Aiysha Jillson, LCSW               Frady Taddeo G Deriona Altemose, LCSW               Kenetra Hildenbrand G Simi Briel, LCSW               Reubin Bushnell G Bonne Whack, LCSW

## 2022-03-17 DIAGNOSIS — I700 Atherosclerosis of aorta: Secondary | ICD-10-CM | POA: Diagnosis not present

## 2022-03-18 ENCOUNTER — Ambulatory Visit: Payer: Medicare Other | Admitting: Psychology

## 2022-03-18 ENCOUNTER — Ambulatory Visit (INDEPENDENT_AMBULATORY_CARE_PROVIDER_SITE_OTHER): Payer: Medicare Other | Admitting: Psychology

## 2022-03-18 DIAGNOSIS — F325 Major depressive disorder, single episode, in full remission: Secondary | ICD-10-CM
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
## 2022-03-19 ENCOUNTER — Encounter: Payer: Self-pay | Admitting: Podiatry

## 2022-03-19 ENCOUNTER — Ambulatory Visit (INDEPENDENT_AMBULATORY_CARE_PROVIDER_SITE_OTHER): Payer: Medicare Other | Admitting: Podiatry

## 2022-03-19 DIAGNOSIS — M79674 Pain in right toe(s): Secondary | ICD-10-CM

## 2022-03-19 DIAGNOSIS — M2010 Hallux valgus (acquired), unspecified foot: Secondary | ICD-10-CM

## 2022-03-19 DIAGNOSIS — M2040 Other hammer toe(s) (acquired), unspecified foot: Secondary | ICD-10-CM

## 2022-03-19 DIAGNOSIS — M216X2 Other acquired deformities of left foot: Secondary | ICD-10-CM

## 2022-03-19 DIAGNOSIS — M216X1 Other acquired deformities of right foot: Secondary | ICD-10-CM

## 2022-03-19 DIAGNOSIS — M79675 Pain in left toe(s): Secondary | ICD-10-CM | POA: Diagnosis not present

## 2022-03-19 DIAGNOSIS — L84 Corns and callosities: Secondary | ICD-10-CM

## 2022-03-19 DIAGNOSIS — B351 Tinea unguium: Secondary | ICD-10-CM

## 2022-03-19 NOTE — Progress Notes (Signed)
This patient returns to the office for evaluation and treatment of long thick painful nails .  This patient is unable to trim her own nails since the patient cannot reach her feet.  Patient says the nails are painful walking and wearing his shoes.  She returns for preventive foot care services.  General Appearance  Alert, conversant and in no acute stress.  Vascular  Dorsalis pedis and posterior tibial  pulses are palpable  bilaterally.  Capillary return is within normal limits  bilaterally. Temperature is within normal limits  bilaterally.  Neurologic  Senn-Weinstein monofilament wire test within normal limits  bilaterally. Muscle power within normal limits bilaterally.  Nails Thick disfigured discolored nails with subungual debris  from hallux to fifth toes bilaterally. No evidence of bacterial infection or drainage bilaterally.  Orthopedic  No limitations of motion  feet .  No crepitus or effusions noted.   HAV 1st MPJ  B/L.  Hammer toes 2-5  B/L. Plantar flexed fifth met  B/L.  Skin  normotropic skin with no porokeratosis noted bilaterally.  No signs of infections or ulcers noted.   Callus subfifth met  B/L.  Onychomycosis  Pain in toes right foot  Pain in toes left foot  Porokeratosis  B/L.  Debridement  of nails  1-5  B/L with a nail nipper.  Nails were then filed using a dremel tool with no incidents. Debride porokeratosis with # 15 blade  B/L. Calluses seem to be improving. RTC 10 weeks    Gardiner Barefoot DPM

## 2022-03-27 DIAGNOSIS — Z23 Encounter for immunization: Secondary | ICD-10-CM | POA: Diagnosis not present

## 2022-04-01 ENCOUNTER — Ambulatory Visit (INDEPENDENT_AMBULATORY_CARE_PROVIDER_SITE_OTHER): Payer: Medicare Other | Admitting: Psychology

## 2022-04-01 ENCOUNTER — Ambulatory Visit: Payer: Medicare Other | Admitting: Psychology

## 2022-04-01 DIAGNOSIS — F325 Major depressive disorder, single episode, in full remission: Secondary | ICD-10-CM | POA: Diagnosis not present

## 2022-04-01 NOTE — Progress Notes (Signed)
[place] Counselor/Therapist Progress Note  Patient ID: Sylvia Maldonado, MRN: [id number],    Date: 04/01/2022  Time Spent: 50 minutes  Treatment Type: Individual Therapy  Reported Symptoms: depression  Mental Status Exam: Appearance:  Casual     Behavior: Appropriate  Motor: Normal  Speech/Language:  Normal Rate  Affect: Appropriate  Mood: normal  Thought process: normal  Thought content:   WNL  Sensory/Perceptual disturbances:   WNL  Orientation: oriented to person, place, time/date, and situation  Attention: Good  Concentration: Good  Memory: WNL  Fund of knowledge:  Good  Insight:   Good  Judgment:  Good  Impulse Control: Good   Risk Assessment: Danger to Self:  No Self-injurious Behavior: No Danger to Others: No Duty to Warn:no Physical Aggression / Violence:No  Access to Firearms a concern: No  Gang Involvement:No   Subjective: The patient attended an individual therapy session via video visit.  The patient gave verbal consent for the session to be the on video on WebEx.  The patient was in her home alone and the therapist was in the office.  The patient presents with a flat affect and mood is pleasant.  The patient reports that this week has been much better than the last time I saw her.  The patient reports that she is doing well with the loss of her friend now and has moved on from that.  The patient continues to walk 4 miles a day and she feels that this is so helpful for her.  The patient knows that she tends not to do as well in this fall because the weather changes and the days are shorter.  The patient reports that she is taking care of herself and her diet and she has a birthday coming up the 28th of this month.  We talked about her continuing to do the things that she needs to do to continue to keep herself in a stable mood.  The patient reports that she is happy that she is not having to work anymore and feels better about that  circumstance. Interventions: Cognitive Behavioral Therapy and Assertiveness/Communication  Diagnosis:Major depressive disorder with single episode, in full remission [facility])  Plan: Treatment Plan  Strengths/Abilities:  Intelligent, insightful  Treatment Preferences:  Outpatient Individual therapy  Statement of Needs:  "I need a therapist to help me with my depression"  Symptoms:  decreased motivation: (Status: improved). poor concentration: (Status: improved). poor sleep: (Status: improved).  Problems Addressed:  Unipolar depression  Goals: 1. New Goal Statement for Unipolar Depression  Describe current and past experiences with depression including their impact on functioning and  attempts to resolve it. Target Date: 2022-09-21 Frequency: monthly Progress: 60 Modality: individual  2.Identify and replace thoughts and beliefs that support depression. Target Date: 2022-09-21 Frequency: monthly Progress: 70 Modality: individual  3.  Learn and implement behavioral strategies to overcome depression. Target Date: 2022-09-21 Frequency: monthly Progress: 70 Modality: individual  4.  Learn and implement problem-solving and decision-making skills. Target Date: 2022-09-21 Frequency: monthly Progress: 90 Modality: individual   Interventions by Therapist:  CBT , insight oriented approach and problem solving therapy.  Treatment plan created with patient and patient approves plan.  Lyric Hoar G Jabarri Stefanelli, LCSW

## 2022-04-06 DIAGNOSIS — I700 Atherosclerosis of aorta: Secondary | ICD-10-CM | POA: Diagnosis not present

## 2022-04-06 DIAGNOSIS — E7800 Pure hypercholesterolemia, unspecified: Secondary | ICD-10-CM | POA: Diagnosis not present

## 2022-04-06 DIAGNOSIS — Z23 Encounter for immunization: Secondary | ICD-10-CM | POA: Diagnosis not present

## 2022-04-13 DIAGNOSIS — H16223 Keratoconjunctivitis sicca, not specified as Sjogren's, bilateral: Secondary | ICD-10-CM | POA: Diagnosis not present

## 2022-04-15 ENCOUNTER — Ambulatory Visit: Payer: Medicare Other | Admitting: Psychology

## 2022-04-29 ENCOUNTER — Ambulatory Visit: Payer: Medicare Other | Admitting: Psychology

## 2022-04-29 ENCOUNTER — Ambulatory Visit (INDEPENDENT_AMBULATORY_CARE_PROVIDER_SITE_OTHER): Payer: Medicare Other | Admitting: Psychology

## 2022-04-29 DIAGNOSIS — F325 Major depressive disorder, single episode, in full remission: Secondary | ICD-10-CM
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
## 2022-04-30 DIAGNOSIS — R051 Acute cough: Secondary | ICD-10-CM | POA: Diagnosis not present

## 2022-04-30 DIAGNOSIS — J3489 Other specified disorders of nose and nasal sinuses: Secondary | ICD-10-CM | POA: Diagnosis not present

## 2022-04-30 DIAGNOSIS — Z6830 Body mass index (BMI) 30.0-30.9, adult: Secondary | ICD-10-CM | POA: Diagnosis not present

## 2022-04-30 DIAGNOSIS — R0981 Nasal congestion: Secondary | ICD-10-CM | POA: Diagnosis not present

## 2022-05-13 ENCOUNTER — Ambulatory Visit: Payer: Medicare Other | Admitting: Psychology

## 2022-05-24 ENCOUNTER — Telehealth (HOSPITAL_COMMUNITY): Payer: Self-pay

## 2022-05-24 NOTE — Telephone Encounter (Signed)
  Patient called to request a refill for the following please advise     Disp Refills Start End   citalopram (CELEXA) 20 MG tablet 90 tablet 1 01/13/2022    Sig - Route: Take 1 tablet (20 mg total) by mouth daily. - Oral   Sent to pharmacy as: citalopram (CELEXA) 20 MG tablet   E-Prescribing Status: Receipt confirmed by pharmacy (01/13/2022  1:49 PM EDT)

## 2022-05-25 DIAGNOSIS — L814 Other melanin hyperpigmentation: Secondary | ICD-10-CM | POA: Diagnosis not present

## 2022-05-25 DIAGNOSIS — L821 Other seborrheic keratosis: Secondary | ICD-10-CM | POA: Diagnosis not present

## 2022-05-25 DIAGNOSIS — D1801 Hemangioma of skin and subcutaneous tissue: Secondary | ICD-10-CM | POA: Diagnosis not present

## 2022-05-26 ENCOUNTER — Other Ambulatory Visit (HOSPITAL_COMMUNITY): Payer: Self-pay | Admitting: *Deleted

## 2022-05-26 MED ORDER — CITALOPRAM HYDROBROMIDE 20 MG PO TABS: 20.0000 mg | ORAL_TABLET | Freq: Every day | ORAL | 5 refills | Status: DC

## 2022-05-27 ENCOUNTER — Ambulatory Visit (INDEPENDENT_AMBULATORY_CARE_PROVIDER_SITE_OTHER): Payer: Medicare Other | Admitting: Psychology

## 2022-05-27 ENCOUNTER — Ambulatory Visit: Payer: Medicare Other | Admitting: Psychology

## 2022-05-27 DIAGNOSIS — F325 Major depressive disorder, single episode, in full remission: Secondary | ICD-10-CM | POA: Diagnosis not present

## 2022-05-27 NOTE — Progress Notes (Signed)
[facility] Counselor/Therapist Progress Note  Patient ID: Sylvia Maldonado, MRN: [id number],    Date: 111/16/2023  Time Spent: 50 minutes  Treatment Type: Individual Therapy  Reported Symptoms: depression  Mental Status Exam: Appearance:  Casual     Behavior: Appropriate  Motor: Normal  Speech/Language:  Normal Rate  Affect: Appropriate  Mood: normal  Thought process: normal  Thought content:   WNL  Sensory/Perceptual disturbances:   WNL  Orientation: oriented to person, place, time/date, and situation  Attention: Good  Concentration: Good  Memory: WNL  Fund of knowledge:  Good  Insight:   Good  Judgment:  Good  Impulse Control: Good   Risk Assessment: Danger to Self:  No Self-injurious Behavior: No Danger to Others: No Duty to Warn:no Physical Aggression / Violence:No  Access to Firearms a concern: No  Gang Involvement:No   Subjective: The patient attended an individual therapy session via video visit.  The patient gave verbal consent for the session to be the on video on WebEx.  The patient was in her home alone and the therapist was in the office.  The patient presents with a flat affect and mood is pleasant.  The patient reports that she just got over COVID about 10 days ago.  She reports that she felt terrible and she had it for about 3 weeks.  Her daughter apparently brought her food and stepped up to the plate during her illness.  She also had contact with her other daughters that she normally does not have contact with and they helped her through that rough patch.  The patient does not seem to be depressed she seems to be managing things pretty well.  We talked about continuing to do some things socially and to have as much social interaction as possible.  Interventions: Cognitive Behavioral Therapy and Assertiveness/Communication  Diagnosis:Major depressive disorder with single episode, in full remission [facility])  Plan: Treatment  Plan  Strengths/Abilities:  Intelligent, insightful  Treatment Preferences:  Outpatient Individual therapy  Statement of Needs:  "I need a therapist to help me with my depression"  Symptoms:  decreased motivation: (Status: improved). poor concentration: (Status: improved). poor sleep: (Status: improved).  Problems Addressed:  Unipolar depression  Goals: 1. New Goal Statement for Unipolar Depression  Describe current and past experiences with depression including their impact on functioning and  attempts to resolve it. Target Date: 2022-09-21 Frequency: monthly Progress: 60 Modality: individual  2.Identify and replace thoughts and beliefs that support depression. Target Date: 2022-09-21 Frequency: monthly Progress: 70 Modality: individual  3.  Learn and implement behavioral strategies to overcome depression. Target Date: 2022-09-21 Frequency: monthly Progress: 70 Modality: individual  4.  Learn and implement problem-solving and decision-making skills. Target Date: 2022-09-21 Frequency: monthly Progress: 90 Modality: individual   Interventions by Therapist:  CBT , insight oriented approach and problem solving therapy.  Treatment plan created with patient and patient approves plan.  Mickala Laton G Kambria Grima, LCSW

## 2022-05-31 ENCOUNTER — Ambulatory Visit: Payer: Medicare Other | Admitting: Podiatry

## 2022-06-09 ENCOUNTER — Ambulatory Visit (INDEPENDENT_AMBULATORY_CARE_PROVIDER_SITE_OTHER): Payer: Medicare Other | Admitting: Podiatry

## 2022-06-09 ENCOUNTER — Encounter: Payer: Self-pay | Admitting: Podiatry

## 2022-06-09 DIAGNOSIS — L84 Corns and callosities: Secondary | ICD-10-CM

## 2022-06-09 DIAGNOSIS — M2010 Hallux valgus (acquired), unspecified foot: Secondary | ICD-10-CM

## 2022-06-09 DIAGNOSIS — B351 Tinea unguium: Secondary | ICD-10-CM | POA: Diagnosis not present

## 2022-06-09 DIAGNOSIS — M79674 Pain in right toe(s): Secondary | ICD-10-CM

## 2022-06-09 DIAGNOSIS — M2040 Other hammer toe(s) (acquired), unspecified foot: Secondary | ICD-10-CM

## 2022-06-09 DIAGNOSIS — M79675 Pain in left toe(s): Secondary | ICD-10-CM | POA: Diagnosis not present

## 2022-06-09 DIAGNOSIS — M216X1 Other acquired deformities of right foot: Secondary | ICD-10-CM

## 2022-06-09 NOTE — Progress Notes (Signed)
This patient returns to the office for evaluation and treatment of long thick painful nails .  This patient is unable to trim her own nails since the patient cannot reach her feet.  Patient says the nails are painful walking and wearing his shoes.  She returns for preventive foot care services.  General Appearance  Alert, conversant and in no acute stress.  Vascular  Dorsalis pedis and posterior tibial  pulses are palpable  bilaterally.  Capillary return is within normal limits  bilaterally. Temperature is within normal limits  bilaterally.  Neurologic  Senn-Weinstein monofilament wire test within normal limits  bilaterally. Muscle power within normal limits bilaterally.  Nails Thick disfigured discolored nails with subungual debris  from hallux to fifth toes bilaterally. No evidence of bacterial infection or drainage bilaterally.  Orthopedic  No limitations of motion  feet .  No crepitus or effusions noted.   HAV 1st MPJ  B/L.  Hammer toes 2-5  B/L. Plantar flexed fifth met  B/L.  Skin  normotropic skin with no porokeratosis noted bilaterally.  No signs of infections or ulcers noted.   Callus subfifth met  B/L asymptomatic.  Onychomycosis  Pain in toes right foot  Pain in toes left foot    Debridement  of nails  1-5  B/L with a nail nipper.  Nails were then filed using a dremel tool with no incidents. Debride porokeratosis with dremel tool. Calluses seem to be improving. RTC 10 weeks      DPM  

## 2022-06-10 ENCOUNTER — Ambulatory Visit: Payer: Medicare Other | Admitting: Psychology

## 2022-06-10 ENCOUNTER — Ambulatory Visit (INDEPENDENT_AMBULATORY_CARE_PROVIDER_SITE_OTHER): Payer: Medicare Other | Admitting: Psychology

## 2022-06-10 DIAGNOSIS — F325 Major depressive disorder, single episode, in full remission: Secondary | ICD-10-CM | POA: Diagnosis not present
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
## 2022-06-15 ENCOUNTER — Ambulatory Visit (HOSPITAL_BASED_OUTPATIENT_CLINIC_OR_DEPARTMENT_OTHER): Payer: Medicare Other | Admitting: Psychiatry

## 2022-06-15 DIAGNOSIS — F320 Major depressive disorder, single episode, mild: Secondary | ICD-10-CM

## 2022-06-15 MED ORDER — ARIPIPRAZOLE 10 MG PO TABS: ORAL_TABLET | ORAL | 2 refills | Status: DC

## 2022-06-15 MED ORDER — CITALOPRAM HYDROBROMIDE 20 MG PO TABS: 20.0000 mg | ORAL_TABLET | Freq: Every day | ORAL | 4 refills | Status: DC

## 2022-06-15 NOTE — Progress Notes (Signed)
Patient ID: Sylvia Maldonado, female   DOB: 08/17/1940, 81 y.o.   MRN: [id number] [facility] MD Progress Note  06/15/2022 3:07 PM Sylvia Maldonado  MRN:  [id number] Subjective: I am feel awful; I feel depressed. Principal Problem: Major depression in remission Diagnosis: Major depression recurrent    Today the patient is doing very well.  She is clearly at her baseline.  She feels good about herself.  She likes to walk a great deal.  She denies daily depression.  She is sleeping and eating well.  She reads she listens to music.  She walks with a lot of good friends.  One of her daughters in [place] is doing well after having a bout of breast cancer.  Another daughter in [place] is doing well.  Her third daughter lives in [place] and has 2 teenage daughters who are doing great.  All of her family seems to be stable at this time.  The patient's health is good.  Financially she is very stable..  Patient is going to the [facility].  Today the patient had an aims scale that demonstrated no evidence of tardive dyskinesia.  The patient had significant declines when her Abilify was reduced.  She takes her medicine Celexa Abilify regularly and is doing great.  She is positive and optimistic and is functioning at a high level.  She continues in one-to-one therapy. Past Surgical History:  Procedure Laterality Date   cataracts surg     COLONOSCOPY     EYE SURGERY     PELVIC FLOOR REPAIR     TONSILLECTOMY     VAGINAL HYSTERECTOMY     Family History:  Family History  Problem Relation Age of Onset   CAD Neg Hx    Family Psychiatric  History:  Social History:  Social History   Substance and Sexual Activity  Alcohol Use No     Social History   Substance and Sexual Activity  Drug Use No    Social History   Socioeconomic History   Marital status: Single    Spouse name: Not on file   Number of children: Not on file   Years of education: Not on file   Highest education level: Not on file   Occupational History   Not on file  Tobacco Use   Smoking status: Never   Smokeless tobacco: Never  Vaping Use   Vaping Use: Never used  Substance and Sexual Activity   Alcohol use: No   Drug use: No   Sexual activity: Not Currently  Other Topics Concern   Not on file  Social History Narrative   Right handed    Lives alone at home    No caffeine usage    Social Determinants of Health   Financial Resource Strain: Not on file  Food Insecurity: Not on file  Transportation Needs: Not on file  Physical Activity: Not on file  Stress: Not on file  Social Connections: Not on file   Additional Social History:                         Sleep: Good  Appetite:  Good  Current Medications: Current Outpatient Medications  Medication Sig Dispense Refill   ARIPiprazole (ABILIFY) 10 MG tablet 1  qam 90 tablet 2   Cholecalciferol (VITAMIN D-3) 1000 UNITS CAPS Take 2,000 Units by mouth daily.     citalopram (CELEXA) 20 MG tablet Take 1 tablet (20 mg total) by mouth daily. P[place]uta  tablet 4   cycloSPORINE (RESTASIS) 0.05 % ophthalmic emulsion Place 1 drop into both eyes daily.      gabapentin (NEURONTIN) 100 MG capsule Take 100 mg by mouth at bedtime.     loteprednol (LOTEMAX) 0.5 % ophthalmic suspension 1 drop into affected eye     Magnesium 200 MG TABS 1 tablet with a meal     SHINGRIX injection      Turmeric 500 MG CAPS 1 capsule     vitamin B-12 (CYANOCOBALAMIN) 1000 MCG tablet Take 1,000 mcg by mouth daily.     No current facility-administered medications for this visit.    Lab Results: No results found for this or any previous visit (from the past 48 hour(s)).  Physical Findings: AIMS:  , ,  ,  ,    CIWA:    COWS:     Musculoskeletal: Strength & Muscle Tone: within normal limits Gait & Station: normal Patient leans: Right  Psychiatric Specialty Exam: ROS  There were no vitals taken for this visit.There is no height or weight on file to calculate BMI.  General  Appearance: Casual  Eye Contact::  Good  Speech:  Clear and Coherent  Volume:  Normal  Mood:  NA  Affect:  Congruent  Thought Process:  Coherent  Orientation:  Full (Time, Place, and Person)  Thought Content:  WDL  Suicidal Thoughts:  No  Homicidal Thoughts:  No  Memory:  NA  Judgement:  NA  Insight:  Good  Psychomotor Activity:  Normal  Concentration:  Good  Recall:  Good  Fund of Knowledge:Good  Language: Good  Akathisia:  No  Handed:  Right  AIMS (if indicated):     Assets:  Desire for Improvement  ADL's:  Intact  Cognition: WNL  Sleep:       [name] 06/15/2022, 3:07 PM   Today the patient is doing great.  Her diagnosis is major depression in remission.  She continues taking Celexa 20 mg and Abilify 10 mg.  She continues in one-to-one therapy as well.  This patient will be seen again in 8 months.  She will return if there is any new changes. Virtual Visit via Telephone Note  I connected with Sylvia Maldonado on 06/15/22 at  2:30 PM EST by telephone and verified that I am speaking with the correct person using two identifiers.  Location: Patient: home Provider: office   I discussed the limitations, risks, security and privacy concerns of performing an evaluation and management service by telephone and the availability of in person appointments. I also discussed with the patient that there may be a patient responsible charge related to this service. The patient expressed understanding and agreed to proceed.      I discussed the assessment and treatment plan with the patient. The patient was provided an opportunity to ask questions and all were answered. The patient agreed with the plan and demonstrated an understanding of the instructions.   The patient was advised to call back or seek an in-person evaluation if the symptoms worsen or if the condition fails to improve as anticipated.  I provided 30 minutes of non-face-to-face time during this  encounter.   [name], MD

## 2022-06-24 ENCOUNTER — Ambulatory Visit (INDEPENDENT_AMBULATORY_CARE_PROVIDER_SITE_OTHER): Payer: Medicare Other | Admitting: Psychology

## 2022-06-24 ENCOUNTER — Ambulatory Visit: Payer: Medicare Other | Admitting: Psychology

## 2022-06-24 DIAGNOSIS — F320 Major depressive disorder, single episode, mild: Secondary | ICD-10-CM | POA: Diagnosis not present

## 2022-06-24 NOTE — Progress Notes (Signed)
[facility] Counselor/Therapist Progress Note  Patient ID: Sylvia Maldonado, MRN: [id number],    Date: 06/24/2022  Time Spent: 50 minutes  Treatment Type: Individual Therapy  Reported Symptoms: depression  Mental Status Exam: Appearance:  Casual     Behavior: Appropriate  Motor: Normal  Speech/Language:  Normal Rate  Affect: Appropriate  Mood: normal  Thought process: normal  Thought content:   WNL  Sensory/Perceptual disturbances:   WNL  Orientation: oriented to person, place, time/date, and situation  Attention: Good  Concentration: Good  Memory: WNL  Fund of knowledge:  Good  Insight:   Good  Judgment:  Good  Impulse Control: Good   Risk Assessment: Danger to Self:  No Self-injurious Behavior: No Danger to Others: No Duty to Warn:no Physical Aggression / Violence:No  Access to Firearms a concern: No  Gang Involvement:No   Subjective: The patient attended an individual therapy session via video visit.  The patient gave verbal consent for the session to be the on video on WebEx.  The patient was in her home alone and the therapist was in the office.  The patient presents with a flat affect and mood is pleasant.  The patient talked about going to the store and getting a dehumidifier and getting a credit card.  She ended up going to the store and cancelling the credit card.  She is worried that she will get scammed as she gets older.  We talked about how to look at the situation differently.  The patient also talked about what she is doing for the holidays and wanted some suggestions on how to write a note to her daughter for her birthday.  The patient is doing very well and continues to request sessions every two weeks.      Interventions: Cognitive Behavioral Therapy and Assertiveness/Communication  Diagnosis:Current mild episode of major depressive disorder, unspecified whether recurrent ([place])  Plan: Treatment Plan  Strengths/Abilities:   Intelligent, insightful  Treatment Preferences:  Outpatient Individual therapy  Statement of Needs:  "I need a therapist to help me with my depression"  Symptoms:  decreased motivation: (Status: improved). poor concentration: (Status: improved). poor sleep: (Status: improved).  Problems Addressed:  Unipolar depression  Goals: 1. New Goal Statement for Unipolar Depression  Describe current and past experiences with depression including their impact on functioning and  attempts to resolve it. Target Date: 2022-09-21 Frequency: monthly Progress: 60 Modality: individual  2.Identify and replace thoughts and beliefs that support depression. Target Date: 2022-09-21 Frequency: monthly Progress: 70 Modality: individual  3.  Learn and implement behavioral strategies to overcome depression. Target Date: 2022-09-21 Frequency: monthly Progress: 70 Modality: individual  4.  Learn and implement problem-solving and decision-making skills. Target Date: 2022-09-21 Frequency: monthly Progress: 90 Modality: individual   Interventions by Therapist:  CBT , insight oriented approach and problem solving therapy.  Treatment plan created with patient and patient approves plan.  Joshlynn Alfonzo G Jayli Fogleman, LCSW

## 2022-07-08 ENCOUNTER — Ambulatory Visit: Payer: Medicare Other | Admitting: Psychology

## 2022-07-08 ENCOUNTER — Ambulatory Visit (INDEPENDENT_AMBULATORY_CARE_PROVIDER_SITE_OTHER): Payer: Medicare Other | Admitting: Psychology

## 2022-07-08 DIAGNOSIS — F320 Major depressive disorder, single episode, mild: Secondary | ICD-10-CM

## 2022-07-08 NOTE — Progress Notes (Signed)
[facility] Counselor/Therapist Progress Note  Patient ID: Sylvia Maldonado, MRN: [id number],    Date: 07/08/2022  Time Spent: 50 minutes  Treatment Type: Individual Therapy  Reported Symptoms: depression  Mental Status Exam: Appearance:  Casual     Behavior: Appropriate  Motor: Normal  Speech/Language:  Normal Rate  Affect: Appropriate  Mood: normal  Thought process: normal  Thought content:   WNL  Sensory/Perceptual disturbances:   WNL  Orientation: oriented to person, place, time/date, and situation  Attention: Good  Concentration: Good  Memory: WNL  Fund of knowledge:  Good  Insight:   Good  Judgment:  Good  Impulse Control: Good   Risk Assessment: Danger to Self:  No Self-injurious Behavior: No Danger to Others: No Duty to Warn:no Physical Aggression / Violence:No  Access to Firearms a concern: No  Gang Involvement:No   Subjective: The patient attended an individual therapy session via video visit.  The patient gave verbal consent for the session to be the on video on WebEx.  The patient was in her home alone and the therapist was in the office.  The patient presents with a flat affect and mood is pleasant.  The patient reports that she had a good Christmas with her daughter and her family.  The patient states that she [facility]  She does say that she has not had as many problems this year with seasonal affective disorder and some of that may be that she is involved in active therapy.  We used some problem solving today to help patient sort through some issues she was having was trying to make some decisions about purchases.  The patient request every other week just to have check and appointments.  She seems to be stable at present.   Interventions: Cognitive Behavioral Therapy and Assertiveness/Communication  Diagnosis:Current mild episode of major depressive disorder, unspecified whether recurrent  ([place])  Plan: Treatment Plan  Strengths/Abilities:  Intelligent, insightful  Treatment Preferences:  Outpatient Individual therapy  Statement of Needs:  "I need a therapist to help me with my depression"  Symptoms:  decreased motivation: (Status: improved). poor concentration: (Status: improved). poor sleep: (Status: improved).  Problems Addressed:  Unipolar depression  Goals: 1. New Goal Statement for Unipolar Depression  Describe current and past experiences with depression including their impact on functioning and  attempts to resolve it. Target Date: 2022-09-21 Frequency: monthly Progress: 60 Modality: individual  2.Identify and replace thoughts and beliefs that support depression. Target Date: 2022-09-21 Frequency: monthly Progress: 70 Modality: individual  3.  Learn and implement behavioral strategies to overcome depression. Target Date: 2022-09-21 Frequency: monthly Progress: 70 Modality: individual  4.  Learn and implement problem-solving and decision-making skills. Target Date: 2022-09-21 Frequency: monthly Progress: 90 Modality: individual   Interventions by Therapist:  CBT , insight oriented approach and problem solving therapy.  Treatment plan created with patient and patient approves plan.  Adelaida Reindel G Joevon Holliman, LCSW

## 2022-07-22 ENCOUNTER — Ambulatory Visit (INDEPENDENT_AMBULATORY_CARE_PROVIDER_SITE_OTHER): Payer: Medicare Other | Admitting: Psychology

## 2022-07-22 DIAGNOSIS — F320 Major depressive disorder, single episode, mild: Secondary | ICD-10-CM | POA: Diagnosis not present

## 2022-07-22 NOTE — Progress Notes (Signed)
[facility] Counselor/Therapist Progress Note  Patient ID: Sylvia Maldonado, MRN: [id number],    Date: 07/22/2022  Time Spent: 50 minutes  Treatment Type: Individual Therapy  Reported Symptoms: depression  Mental Status Exam: Appearance:  Casual     Behavior: Appropriate  Motor: Normal  Speech/Language:  Normal Rate  Affect: Appropriate  Mood: normal  Thought process: normal  Thought content:   WNL  Sensory/Perceptual disturbances:   WNL  Orientation: oriented to person, place, time/date, and situation  Attention: Good  Concentration: Good  Memory: WNL  Fund of knowledge:  Good  Insight:   Good  Judgment:  Good  Impulse Control: Good   Risk Assessment: Danger to Self:  No Self-injurious Behavior: No Danger to Others: No Duty to Warn:no Physical Aggression / Violence:No  Access to Firearms a concern: No  Gang Involvement:No   Subjective: The patient attended an individual therapy session via video visit.  The patient gave verbal consent for the session to be the on video on WebEx.  The patient was in her home alone and the therapist was in the office.  The patient presents with a flat affect and mood is pleasant.  The patient states that things have been going well.  She is back to walking on a regular basis and doing well with that.  She did bring up a situation that happened with her son's mother over Christmas.  She states that his mother came up to her while they were taking a walk and stated to her that she always talks about her daughter and never talks about her son.  The patient was asking what she should do about that situation and we processed it and it seems that maybe the best thing to do at this point is just let go and not try to do anything.  I explained that just because his mother said something does not mean that that is an accurate depiction of what is happening.  We talked about the most important thing being what her daughter and  son-in-law thought.  We talked about if it happened again and then she might want to do something about it but it seems that maybe this is an isolated incident.  The patient felt that this may be helpful.  Used problem solving therapy during this session.  Interventions: Cognitive Behavioral Therapy and Assertiveness/Communication  Diagnosis:Current mild episode of major depressive disorder, unspecified whether recurrent ([place])  Plan: Treatment Plan  Strengths/Abilities:  Intelligent, insightful  Treatment Preferences:  Outpatient Individual therapy  Statement of Needs:  "I need a therapist to help me with my depression"  Symptoms:  decreased motivation: (Status: improved). poor concentration: (Status: improved). poor sleep: (Status: improved).  Problems Addressed:  Unipolar depression  Goals: 1. New Goal Statement for Unipolar Depression  Describe current and past experiences with depression including their impact on functioning and  attempts to resolve it. Target Date: 2022-09-21 Frequency: monthly Progress: 60 Modality: individual  2.Identify and replace thoughts and beliefs that support depression. Target Date: 2022-09-21 Frequency: monthly Progress: 70 Modality: individual  3.  Learn and implement behavioral strategies to overcome depression. Target Date: 2022-09-21 Frequency: monthly Progress: 70 Modality: individual  4.  Learn and implement problem-solving and decision-making skills. Target Date: 2022-09-21 Frequency: monthly Progress: 90 Modality: individual   Interventions by Therapist:  CBT , insight oriented approach and problem solving therapy.  Treatment plan created with patient and patient approves plan.  [place] G Marie Borowski, LCSW

## 2022-08-05 ENCOUNTER — Ambulatory Visit (INDEPENDENT_AMBULATORY_CARE_PROVIDER_SITE_OTHER): Payer: Medicare Other | Admitting: Psychology

## 2022-08-05 DIAGNOSIS — F320 Major depressive disorder, single episode, mild: Secondary | ICD-10-CM

## 2022-08-05 NOTE — Progress Notes (Signed)
[place] Counselor/Therapist Progress Note  Patient ID: Sylvia Maldonado, MRN: [id number],    Date: 08/05/2022  Time Spent: 50 minutes  Treatment Type: Individual Therapy  Reported Symptoms: depression  Mental Status Exam: Appearance:  Casual     Behavior: Appropriate  Motor: Normal  Speech/Language:  Normal Rate  Affect: Appropriate  Mood: normal  Thought process: normal  Thought content:   WNL  Sensory/Perceptual disturbances:   WNL  Orientation: oriented to person, place, time/date, and situation  Attention: Good  Concentration: Good  Memory: WNL  Fund of knowledge:  Good  Insight:   Good  Judgment:  Good  Impulse Control: Good   Risk Assessment: Danger to Self:  No Self-injurious Behavior: No Danger to Others: No Duty to Warn:no Physical Aggression / Violence:No  Access to Firearms a concern: No  Gang Involvement:No   Subjective: The patient attended an individual therapy session via video visit.  The patient gave verbal consent for the session to be the on video on WebEx.  The patient was in her home alone and the therapist was in the office.  The patient presents with a flat affect and mood is pleasant.  The patient continues to report that she is doing well.  She is walking about 2-1/2 hours most days.  She continues to do things at home and she talked about being glad that she no longer is having to go to work.  The patient continues to take care of her health by eating in a healthy way and also by doing her exercise on a regular basis.  The patient does report that she has not been as depressed this year but with seasonal affective disorder like she usually is.  She did not have any particular issues that she wanted to address today and we basically did a check in.  Interventions: Cognitive Behavioral Therapy and Assertiveness/Communication  Diagnosis:Current mild episode of major depressive disorder, unspecified whether recurrent  ([place])  Plan: Treatment Plan  Strengths/Abilities:  Intelligent, insightful  Treatment Preferences:  Outpatient Individual therapy  Statement of Needs:  "I need a therapist to help me with my depression"  Symptoms:  decreased motivation: (Status: improved). poor concentration: (Status: improved). poor sleep: (Status: improved).  Problems Addressed:  Unipolar depression  Goals: 1. New Goal Statement for Unipolar Depression  Describe current and past experiences with depression including their impact on functioning and  attempts to resolve it. Target Date: 2022-09-21 Frequency: monthly Progress: 60 Modality: individual  2.Identify and replace thoughts and beliefs that support depression. Target Date: 2022-09-21 Frequency: monthly Progress: 70 Modality: individual  3.  Learn and implement behavioral strategies to overcome depression. Target Date: 2022-09-21 Frequency: monthly Progress: 70 Modality: individual  4.  Learn and implement problem-solving and decision-making skills. Target Date: 2022-09-21 Frequency: monthly Progress: 90 Modality: individual   Interventions by Therapist:  CBT , insight oriented approach and problem solving therapy.  Treatment plan created with patient and patient approves plan.  Kaitlyn Franko G Jazsmine Macari, LCSW

## 2022-08-13 DIAGNOSIS — Z23 Encounter for immunization: Secondary | ICD-10-CM | POA: Diagnosis not present

## 2022-08-19 ENCOUNTER — Ambulatory Visit (INDEPENDENT_AMBULATORY_CARE_PROVIDER_SITE_OTHER): Payer: Medicare Other | Admitting: Psychology

## 2022-08-19 DIAGNOSIS — F320 Major depressive disorder, single episode, mild: Secondary | ICD-10-CM

## 2022-08-19 NOTE — Progress Notes (Signed)
    [place] Counselor/Therapist Progress Note  Patient ID: Sylvia Maldonado, MRN: [id number],    Date: 08/19/2022  Time Spent: 50 minutes  Treatment Type: Individual Therapy  Reported Symptoms: depression  Mental Status Exam: Appearance:  Casual     Behavior: Appropriate  Motor: Normal  Speech/Language:  Normal Rate  Affect: Appropriate  Mood: normal  Thought process: normal  Thought content:   WNL  Sensory/Perceptual disturbances:   WNL  Orientation: oriented to person, place, time/date, and situation  Attention: Good  Concentration: Good  Memory: WNL  Fund of knowledge:  Good  Insight:   Good  Judgment:  Good  Impulse Control: Good   Risk Assessment: Danger to Self:  No Self-injurious Behavior: No Danger to Others: No Duty to Warn:no Physical Aggression / Violence:No  Access to Firearms a concern: No  Gang Involvement:No   Subjective: The patient attended an individual therapy session via video visit.  The patient gave verbal consent for the session to be the on video on WebEx.  The patient was in her home alone and the therapist was in the office.  The patient presents with a flat affect and mood is pleasant.  The patient states that she feels like things are going well.  She continues to do her exercise and she is eating well.  She wanted some assistance with what to say to her granddaughter for her birthday.  We discussed some things that she could talk to her about.  The patient still prefers to check in every 2 weeks and is doing well.  We will continue to meet and make sure that she is stable.  She reports that she is not having any real depressive symptoms at this moment. Interventions: Cognitive Behavioral Therapy and Assertiveness/Communication  Diagnosis:Current mild episode of major depressive disorder, unspecified whether recurrent ([place])  Plan: Treatment Plan  Strengths/Abilities:  Intelligent, insightful  Treatment Preferences:   Outpatient Individual therapy  Statement of Needs:  "I need a therapist to help me with my depression"  Symptoms:  decreased motivation: (Status: improved). poor concentration: (Status: improved). poor sleep: (Status: improved).  Problems Addressed:  Unipolar depression  Goals: 1. New Goal Statement for Unipolar Depression  Describe current and past experiences with depression including their impact on functioning and  attempts to resolve it. Target Date: 2022-09-21 Frequency: monthly Progress: 60 Modality: individual  2.Identify and replace thoughts and beliefs that support depression. Target Date: 2022-09-21 Frequency: monthly Progress: 70 Modality: individual  3.  Learn and implement behavioral strategies to overcome depression. Target Date: 2022-09-21 Frequency: monthly Progress: 70 Modality: individual  4.  Learn and implement problem-solving and decision-making skills. Target Date: 2022-09-21 Frequency: monthly Progress: 90 Modality: individual   Interventions by Therapist:  CBT , insight oriented approach and problem solving therapy.  Treatment plan created with patient and patient approves plan.  Jakai Onofre G Rufus Cypert, LCSW

## 2022-08-23 ENCOUNTER — Ambulatory Visit (INDEPENDENT_AMBULATORY_CARE_PROVIDER_SITE_OTHER): Payer: Medicare Other | Admitting: Podiatry

## 2022-08-23 ENCOUNTER — Encounter: Payer: Self-pay | Admitting: Podiatry

## 2022-08-23 VITALS — BP 137/72 | HR 59

## 2022-08-23 DIAGNOSIS — M79674 Pain in right toe(s): Secondary | ICD-10-CM | POA: Diagnosis not present

## 2022-08-23 DIAGNOSIS — M216X1 Other acquired deformities of right foot: Secondary | ICD-10-CM

## 2022-08-23 DIAGNOSIS — M79675 Pain in left toe(s): Secondary | ICD-10-CM | POA: Diagnosis not present

## 2022-08-23 DIAGNOSIS — B351 Tinea unguium: Secondary | ICD-10-CM

## 2022-08-23 DIAGNOSIS — L84 Corns and callosities: Secondary | ICD-10-CM | POA: Diagnosis not present

## 2022-08-23 NOTE — Progress Notes (Signed)
This patient returns to the office for evaluation and treatment of long thick painful nails .  This patient is unable to trim her own nails since the patient cannot reach her feet.  Patient says the nails are painful walking and wearing his shoes.  She returns for preventive foot care services.  General Appearance  Alert, conversant and in no acute stress.  Vascular  Dorsalis pedis and posterior tibial  pulses are palpable  bilaterally.  Capillary return is within normal limits  bilaterally. Temperature is within normal limits  bilaterally.  Neurologic  Senn-Weinstein monofilament wire test within normal limits  bilaterally. Muscle power within normal limits bilaterally.  Nails Thick disfigured discolored nails with subungual debris  from hallux to fifth toes bilaterally. No evidence of bacterial infection or drainage bilaterally.  Orthopedic  No limitations of motion  feet .  No crepitus or effusions noted.   HAV 1st MPJ  B/L.  Hammer toes 2-5  B/L. Plantar flexed fifth met  B/L.  Skin  normotropic skin with no porokeratosis noted bilaterally.  No signs of infections or ulcers noted.   Callus subfifth met  right foot.  Onychomycosis  Pain in toes right foot  Pain in toes left foot  Callus right forefoot.  Debridement  of nails  1-5  B/L with a nail nipper.  Nails were then filed using a dremel tool with no incidents. Debride porokeratosis with dremel tool. Calluses seem to be improving. RTC 10 weeks    Gardiner Barefoot DPM

## 2022-09-02 ENCOUNTER — Ambulatory Visit (INDEPENDENT_AMBULATORY_CARE_PROVIDER_SITE_OTHER): Payer: Medicare Other | Admitting: Psychology

## 2022-09-02 DIAGNOSIS — F320 Major depressive disorder, single episode, mild: Secondary | ICD-10-CM

## 2022-09-02 NOTE — Progress Notes (Signed)
[facility] Counselor/Therapist Progress Note  Patient ID: Sylvia Maldonado, MRN: [id number],    Date: 09/02/2022  Time Spent: 45 minutes  Treatment Type: Individual Therapy  Reported Symptoms: depression  Mental Status Exam: Appearance:  Casual     Behavior: Appropriate  Motor: Normal  Speech/Language:  Normal Rate  Affect: Appropriate  Mood: normal  Thought process: normal  Thought content:   WNL  Sensory/Perceptual disturbances:   WNL  Orientation: oriented to person, place, time/date, and situation  Attention: Good  Concentration: Good  Memory: WNL  Fund of knowledge:  Good  Insight:   Good  Judgment:  Good  Impulse Control: Good   Risk Assessment: Danger to Self:  No Self-injurious Behavior: No Danger to Others: No Duty to Warn:no Physical Aggression / Violence:No  Access to Firearms a concern: No  Gang Involvement:No   Subjective: The patient attended an individual therapy session via video visit.  The patient gave verbal consent for the session to be the on video on WebEx.  The patient was in her home alone and the therapist was in the office.  The patient presents with a flat affect and mood is pleasant.  The patient reports that she feels like she is doing well.  The patient does say that she is not walking quite as much because she has been having some back issues.  We talked today about her being angry because her daughter had told her other daughter that she was trying to break up her marriage with her husband.  I explained that at this point she probably cannot do much of anything about that circumstance unless one of them brings it up again and we did some role playing and I gave her some direction on how to handle the situation.   Interventions: Cognitive Behavioral Therapy and Assertiveness/Communication  Diagnosis:Current mild episode of major depressive disorder, unspecified whether recurrent ([place])  Plan: Treatment  Plan  Strengths/Abilities:  Intelligent, insightful  Treatment Preferences:  Outpatient Individual therapy  Statement of Needs:  "I need a therapist to help me with my depression"  Symptoms:  decreased motivation: (Status: improved). poor concentration: (Status: improved). poor sleep: (Status: improved).  Problems Addressed:  Unipolar depression  Goals: 1. New Goal Statement for Unipolar Depression  Describe current and past experiences with depression including their impact on functioning and  attempts to resolve it. Target Date: 2022-09-21 Frequency: monthly Progress: 60 Modality: individual  2.Identify and replace thoughts and beliefs that support depression. Target Date: 2022-09-21 Frequency: monthly Progress: 70 Modality: individual  3.  Learn and implement behavioral strategies to overcome depression. Target Date: 2022-09-21 Frequency: monthly Progress: 70 Modality: individual  4.  Learn and implement problem-solving and decision-making skills. Target Date: 2022-09-21 Frequency: monthly Progress: 90 Modality: individual   Interventions by Therapist:  CBT , insight oriented approach and problem solving therapy.  Treatment plan created with patient and patient approves plan.  Amina Menchaca G Kiyonna Tortorelli, LCSW  Sophy Mesler G Trishelle Devora, LCSW

## 2022-09-08 DIAGNOSIS — M25551 Pain in right hip: Secondary | ICD-10-CM | POA: Diagnosis not present

## 2022-09-08 DIAGNOSIS — M48061 Spinal stenosis, lumbar region without neurogenic claudication: Secondary | ICD-10-CM | POA: Diagnosis not present

## 2022-09-08 DIAGNOSIS — M545 Low back pain, unspecified: Secondary | ICD-10-CM | POA: Diagnosis not present

## 2022-09-08 DIAGNOSIS — M25552 Pain in left hip: Secondary | ICD-10-CM | POA: Diagnosis not present

## 2022-09-16 ENCOUNTER — Ambulatory Visit (INDEPENDENT_AMBULATORY_CARE_PROVIDER_SITE_OTHER): Payer: Medicare Other | Admitting: Psychology

## 2022-09-16 DIAGNOSIS — F320 Major depressive disorder, single episode, mild: Secondary | ICD-10-CM | POA: Diagnosis not present

## 2022-09-16 NOTE — Progress Notes (Signed)
[place] [facility] Counselor/Therapist Progress Note  Patient ID: Sylvia Maldonado, MRN: [id number],    Date: 09/16/2022  Time Spent: 45 minutes  Treatment Type: Individual Therapy  Reported Symptoms: depression  Mental Status Exam: Appearance:  Casual     Behavior: Appropriate  Motor: Normal  Speech/Language:  Normal Rate  Affect: Appropriate  Mood: normal  Thought process: normal  Thought content:   WNL  Sensory/Perceptual disturbances:   WNL  Orientation: oriented to person, place, time/date, and situation  Attention: Good  Concentration: Good  Memory: WNL  Fund of knowledge:  Good  Insight:   Good  Judgment:  Good  Impulse Control: Good   Risk Assessment: Danger to Self:  No Self-injurious Behavior: No Danger to Others: No Duty to Warn:no Physical Aggression / Violence:No  Access to Firearms a concern: No  Gang Involvement:No   Subjective: The patient attended an individual therapy session via video visit.  The patient gave verbal consent for the session to be the on video on WebEx.  The patient was in her home alone and the therapist was in the office.  The patient presents with a flat affect and mood is pleasant.  The patient reports that she has not been feeling well lately.  She states that she had a headache and dizziness a few weeks ago and she decided that she was going to go off of her statin.  She states states that the headache did not go away and she called her doctor and decided to go back on her statin.  We talked about her possibly needing to go back to her PCP and let her PCP know that she has been having a headache for at least a week.  I asked her about whether she thought she might have seasonal allergies.  I encouraged her to call and make an appointment with her PCP so that she can at least get confirmation that she is physically okay.  The patient appreciated the recommendation.  She reports that everything else is going well and she is  walking every day. Interventions: Cognitive Behavioral Therapy and Assertiveness/Communication  Diagnosis:Current mild episode of major depressive disorder, unspecified whether recurrent ([place])  Plan: Treatment Plan  Strengths/Abilities:  Intelligent, insightful  Treatment Preferences:  Outpatient Individual therapy  Statement of Needs:  "I need a therapist to help me with my depression"  Symptoms:  decreased motivation: (Status: improved). poor concentration: (Status: improved). poor sleep: (Status: improved).  Problems Addressed:  Unipolar depression  Goals: 1. New Goal Statement for Unipolar Depression  Describe current and past experiences with depression including their impact on functioning and  attempts to resolve it. Target Date: 2023-09-21 Frequency: monthly Progress: 60 Modality: individual  2.Identify and replace thoughts and beliefs that support depression. Target Date: 2023-09-21 Frequency: monthly Progress: 70 Modality: individual  3.  Learn and implement behavioral strategies to overcome depression. Target Date: 2023-09-21 Frequency: monthly Progress: 70 Modality: individual  4.  Learn and implement problem-solving and decision-making skills. Target Date: 2023-09-21 Frequency: monthly Progress: 90 Modality: individual   Interventions by Therapist:  CBT , insight oriented approach and problem solving therapy.  Treatment plan created with patient and patient approves plan.  Jawara Latorre G Jaimin Krupka, LCSW  Artrell Lawless G Marvine Encalade, LCSW

## 2022-09-30 ENCOUNTER — Ambulatory Visit (INDEPENDENT_AMBULATORY_CARE_PROVIDER_SITE_OTHER): Payer: Medicare Other | Admitting: Psychology

## 2022-09-30 DIAGNOSIS — F320 Major depressive disorder, single episode, mild: Secondary | ICD-10-CM | POA: Diagnosis not present

## 2022-09-30 NOTE — Progress Notes (Signed)
[place] [facility] Counselor/Therapist Progress Note  Patient ID: Sylvia Maldonado, MRN: [id number],    Date: 09/30/2022  Time Spent: 45 minutes  Treatment Type: Individual Therapy  Reported Symptoms: depression  Mental Status Exam: Appearance:  Casual     Behavior: Appropriate  Motor: Normal  Speech/Language:  Normal Rate  Affect: Appropriate  Mood: normal  Thought process: normal  Thought content:   WNL  Sensory/Perceptual disturbances:   WNL  Orientation: oriented to person, place, time/date, and situation  Attention: Good  Concentration: Good  Memory: WNL  Fund of knowledge:  Good  Insight:   Good  Judgment:  Good  Impulse Control: Good   Risk Assessment: Danger to Self:  No Self-injurious Behavior: No Danger to Others: No Duty to Warn:no Physical Aggression / Violence:No  Access to Firearms a concern: No  Gang Involvement:No   Subjective: The patient attended an individual therapy session via video visit.  The patient gave verbal consent for the session to be the on video on WebEx.  The patient was in her home alone and the therapist was in the office.  The patient presents with a flat affect and mood is a little anxious.  The patient reports that she has a growth on the top of her foot and she is concerned about it.  I did a little bit of problem solving and explained that she likely has a sister something and that she does not need to jump to the worst case scenario.  She had already thought that it might be cancer.  I explained to her that we start out slow and then we move up to that if necessary.  The patient has a doctor's appointment tomorrow.  She continues to walk and eat healthy and is doing things socially as they come up.  Interventions: Cognitive Behavioral Therapy and Assertiveness/Communication  Diagnosis:Current mild episode of major depressive disorder, unspecified whether recurrent ([place])  Plan: Treatment Plan  Strengths/Abilities:   Intelligent, insightful  Treatment Preferences:  Outpatient Individual therapy  Statement of Needs:  "I need a therapist to help me with my depression"  Symptoms:  decreased motivation: (Status: improved). poor concentration: (Status: improved). poor sleep: (Status: improved).  Problems Addressed:  Unipolar depression  Goals: 1. New Goal Statement for Unipolar Depression  Describe current and past experiences with depression including their impact on functioning and  attempts to resolve it. Target Date: 2023-09-21 Frequency: monthly Progress: 60 Modality: individual  2.Identify and replace thoughts and beliefs that support depression. Target Date: 2023-09-21 Frequency: monthly Progress: 70 Modality: individual  3.  Learn and implement behavioral strategies to overcome depression. Target Date: 2023-09-21 Frequency: monthly Progress: 70 Modality: individual  4.  Learn and implement problem-solving and decision-making skills. Target Date: 2023-09-21 Frequency: monthly Progress: 90 Modality: individual   Interventions by Therapist:  CBT , insight oriented approach and problem solving therapy.  Treatment plan created with patient and patient approves plan.  Romy Ipock G Tonie Elsey, LCSW

## 2022-10-01 ENCOUNTER — Ambulatory Visit: Payer: Medicare Other

## 2022-10-01 ENCOUNTER — Encounter: Payer: Self-pay | Admitting: Podiatry

## 2022-10-01 ENCOUNTER — Ambulatory Visit (INDEPENDENT_AMBULATORY_CARE_PROVIDER_SITE_OTHER): Payer: Medicare Other | Admitting: Podiatry

## 2022-10-01 DIAGNOSIS — M2012 Hallux valgus (acquired), left foot: Secondary | ICD-10-CM | POA: Diagnosis not present

## 2022-10-01 DIAGNOSIS — M2010 Hallux valgus (acquired), unspecified foot: Secondary | ICD-10-CM

## 2022-10-01 DIAGNOSIS — M6740 Ganglion, unspecified site: Secondary | ICD-10-CM

## 2022-10-02 NOTE — Progress Notes (Signed)
Subjective:   Patient ID: Sylvia Maldonado, female   DOB: 82 y.o.   MRN: [id number]   HPI Patient presents concerned about a small cyst which is popped up on the dorsum of the left foot stating that it is not tender but she is concerned why it is there.  Patient does not smoke   ROS      Objective:  Physical Exam  Neurovascular status intact with a small nodule on the dorsum of the left ankle that is measuring about 5 x 5 mm does not appear to be freely movable and appears to be more within the tissue     Assessment:  Probability for small ganglionic cyst left but very small at this time     Plan:  H&P x-ray reviewed I recommended warm compresses as it is too small to drain but if it were to grow in size we can drain it or excise it.  Educated her on condition currently and hopefully it will go away on its own  X-rays were negative for signs of calcification bone spur formation associated with condition

## 2022-10-14 ENCOUNTER — Ambulatory Visit (INDEPENDENT_AMBULATORY_CARE_PROVIDER_SITE_OTHER): Payer: Medicare Other | Admitting: Psychology

## 2022-10-14 DIAGNOSIS — D071 Carcinoma in situ of vulva: Secondary | ICD-10-CM | POA: Diagnosis not present

## 2022-10-14 DIAGNOSIS — F320 Major depressive disorder, single episode, mild: Secondary | ICD-10-CM | POA: Diagnosis not present

## 2022-10-14 DIAGNOSIS — Z1231 Encounter for screening mammogram for malignant neoplasm of breast: Secondary | ICD-10-CM | POA: Diagnosis not present

## 2022-10-14 DIAGNOSIS — Z01419 Encounter for gynecological examination (general) (routine) without abnormal findings: Secondary | ICD-10-CM | POA: Diagnosis not present

## 2022-10-14 NOTE — Progress Notes (Signed)
     [facility] Counselor/Therapist Progress Note  Patient ID: Sylvia Maldonado, MRN: [id number],    Date: 10/14/2022  Time Spent: 45 minutes  Treatment Type: Individual Therapy  Reported Symptoms: depression  Mental Status Exam: Appearance:  Casual     Behavior: Appropriate  Motor: Normal  Speech/Language:  Normal Rate  Affect: Appropriate  Mood: normal  Thought process: normal  Thought content:   WNL  Sensory/Perceptual disturbances:   WNL  Orientation: oriented to person, place, time/date, and situation  Attention: Good  Concentration: Good  Memory: WNL  Fund of knowledge:  Good  Insight:   Good  Judgment:  Good  Impulse Control: Good   Risk Assessment: Danger to Self:  No Self-injurious Behavior: No Danger to Others: No Duty to Warn:no Physical Aggression / Violence:No  Access to Firearms a concern: No  Gang Involvement:No   Subjective: The patient attended an individual therapy session via video visit.  The patient gave verbal consent for the session to be the on video on WebEx.  The patient was in her home alone and the therapist was in the office.  The patient presents with a flat affect and mood is pleasant.  The patient reports that she did go to the doctor for the thing on her foot and it was a cyst.  She is choosing not to do anything about that right now and can go back if she needs to.  She states that she is feeling good today and has no particular worries to address.  We talked about her continuing to take care of herself and some of the things that she has going on that she is looking forward to.  We will continue to provide provide supportive therapy and cognitive behavioral therapy as needed. Interventions: Cognitive Behavioral Therapy and Assertiveness/Communication  Diagnosis:Current mild episode of major depressive disorder, unspecified whether recurrent  Plan: Treatment Plan  Strengths/Abilities:  Intelligent,  insightful  Treatment Preferences:  Outpatient Individual therapy  Statement of Needs:  "I need a therapist to help me with my depression"  Symptoms:  decreased motivation: (Status: improved). poor concentration: (Status: improved). poor sleep: (Status: improved).  Problems Addressed:  Unipolar depression  Goals: 1. New Goal Statement for Unipolar Depression  Describe current and past experiences with depression including their impact on functioning and  attempts to resolve it. Target Date: 2023-09-21 Frequency: monthly Progress: 60 Modality: individual  2.Identify and replace thoughts and beliefs that support depression. Target Date: 2023-09-21 Frequency: monthly Progress: 70 Modality: individual  3.  Learn and implement behavioral strategies to overcome depression. Target Date: 2023-09-21 Frequency: monthly Progress: 70 Modality: individual  4.  Learn and implement problem-solving and decision-making skills. Target Date: 2023-09-21 Frequency: monthly Progress: 90 Modality: individual   Interventions by Therapist:  CBT , insight oriented approach and problem solving therapy.  Treatment plan created with patient and patient approves plan.  [name] G [name], LCSW

## 2022-10-20 DIAGNOSIS — R197 Diarrhea, unspecified: Secondary | ICD-10-CM | POA: Diagnosis not present

## 2022-10-20 DIAGNOSIS — Z6820 Body mass index (BMI) 20.0-20.9, adult: Secondary | ICD-10-CM | POA: Diagnosis not present

## 2022-10-25 DIAGNOSIS — H40013 Open angle with borderline findings, low risk, bilateral: Secondary | ICD-10-CM | POA: Diagnosis not present

## 2022-10-28 ENCOUNTER — Ambulatory Visit (INDEPENDENT_AMBULATORY_CARE_PROVIDER_SITE_OTHER): Payer: Medicare Other | Admitting: Psychology

## 2022-10-28 DIAGNOSIS — F320 Major depressive disorder, single episode, mild: Secondary | ICD-10-CM | POA: Diagnosis not present

## 2022-10-28 NOTE — Progress Notes (Signed)
[facility] Counselor/Therapist Progress Note  Patient ID: Sylvia Maldonado, MRN: [id number],    Date: 10/28/2022  Time Spent: 45 minutes  Treatment Type: Individual Therapy  Reported Symptoms: depression  Mental Status Exam: Appearance:  Casual     Behavior: Appropriate  Motor: Normal  Speech/Language:  Normal Rate  Affect: Appropriate  Mood: normal  Thought process: normal  Thought content:   WNL  Sensory/Perceptual disturbances:   WNL  Orientation: oriented to person, place, time/date, and situation  Attention: Good  Concentration: Good  Memory: WNL  Fund of knowledge:  Good  Insight:   Good  Judgment:  Good  Impulse Control: Good   Risk Assessment: Danger to Self:  No Self-injurious Behavior: No Danger to Others: No Duty to Warn:no Physical Aggression / Violence:No  Access to Firearms a concern: No  Gang Involvement:No   Subjective: The patient attended an individual therapy session via video visit.  The patient gave verbal consent for the session to be the on video on WebEx.  The patient was in her home alone and the therapist was in the office.  The patient presents with a flat affect and mood is pleasant.  The patient states that she is waiting for a call back from her PCP.  Apparently she has been having an issue with diarrhea over the last month or so and she is concerned about it.  I explained to her that more than likely the PCP will refer her to the GI doctor and she may have to go through some test and we talked about some of the things that it possibly could have been.  The patient reports that she seems to be handling things relatively well right now and is not feeling too depressed however she is having a little anxiety about her health.  Provided supportive therapy and we will see her again in 2 weeks.  Interventions: Cognitive Behavioral Therapy and Assertiveness/Communication  Diagnosis:Current mild episode of major depressive  disorder, unspecified whether recurrent  Plan: Treatment Plan  Strengths/Abilities:  Intelligent, insightful  Treatment Preferences:  Outpatient Individual therapy  Statement of Needs:  "I need a therapist to help me with my depression"  Symptoms:  decreased motivation: (Status: improved). poor concentration: (Status: improved). poor sleep: (Status: improved).  Problems Addressed:  Unipolar depression  Goals: 1. New Goal Statement for Unipolar Depression  Describe current and past experiences with depression including their impact on functioning and  attempts to resolve it. Target Date: 2023-09-21 Frequency: monthly Progress: 60 Modality: individual  2.Identify and replace thoughts and beliefs that support depression. Target Date: 2023-09-21 Frequency: monthly Progress: 70 Modality: individual  3.  Learn and implement behavioral strategies to overcome depression. Target Date: 2023-09-21 Frequency: monthly Progress: 70 Modality: individual  4.  Learn and implement problem-solving and decision-making skills. Target Date: 2023-09-21 Frequency: monthly Progress: 90 Modality: individual   Interventions by Therapist:  CBT , insight oriented approach and problem solving therapy.  Treatment plan created with patient and patient approves plan.  Jacob Chamblee G Artie Mcintyre, LCSW

## 2022-10-29 DIAGNOSIS — R197 Diarrhea, unspecified: Secondary | ICD-10-CM | POA: Diagnosis not present

## 2022-10-29 DIAGNOSIS — D649 Anemia, unspecified: Secondary | ICD-10-CM | POA: Diagnosis not present

## 2022-11-01 DIAGNOSIS — R197 Diarrhea, unspecified: Secondary | ICD-10-CM | POA: Diagnosis not present

## 2022-11-01 DIAGNOSIS — R1084 Generalized abdominal pain: Secondary | ICD-10-CM | POA: Diagnosis not present

## 2022-11-11 ENCOUNTER — Ambulatory Visit (INDEPENDENT_AMBULATORY_CARE_PROVIDER_SITE_OTHER): Payer: Medicare Other | Admitting: Psychology

## 2022-11-11 DIAGNOSIS — F320 Major depressive disorder, single episode, mild: Secondary | ICD-10-CM

## 2022-11-11 NOTE — Progress Notes (Signed)
[facility] Counselor/Therapist Progress Note  Patient ID: Sylvia Maldonado, MRN: [id number],    Date: 11/11/2022  Time Spent: 45 minutes  Treatment Type: Individual Therapy  Reported Symptoms: depression  Mental Status Exam: Appearance:  Casual     Behavior: Appropriate  Motor: Normal  Speech/Language:  Normal Rate  Affect: Appropriate  Mood: normal  Thought process: normal  Thought content:   WNL  Sensory/Perceptual disturbances:   WNL  Orientation: oriented to person, place, time/date, and situation  Attention: Good  Concentration: Good  Memory: WNL  Fund of knowledge:  Good  Insight:   Good  Judgment:  Good  Impulse Control: Good   Risk Assessment: Danger to Self:  No Self-injurious Behavior: No Danger to Others: No Duty to Warn:no Physical Aggression / Violence:No  Access to Firearms a concern: No  Gang Involvement:No   Subjective: The patient attended an individual therapy session via video visit.  The patient gave verbal consent for the session to be the on video on WebEx.  The patient was in her home alone and the therapist was in the office.  The patient presents with a flat affect and mood is pleasant.   The patient reports that her doctor had recommended an endoscopy and a colonoscopy because she was having stomach issues.  The patient reports that she looked up online and a bland diet and she has changed her diet and apparently her pain is gone from her stomach.  She also reports that she is no longer having diarrhea.  We processed what is going on with her and she is still taking Imodium on a daily basis.  I recommended that she wean herself off of the Imodium or have a conversation with her primary care doctor and wean herself off of the Imodium to see if the diet itself is working or not.  I explained to the patient that Imodium was not something that you took every day and that it could cause the opposite affect possibly.  I told her that it  might be easier to tell whether the diet itself is working if she is not taking the Imodium.  She has an appointment with her PCP next week and I urged her to keep that.  Otherwise the patient is doing very well and continues to walk on a daily basis and exercise.  She denies any symptoms of depression.    Interventions: Cognitive Behavioral Therapy and Assertiveness/Communication  Diagnosis:Current mild episode of major depressive disorder, unspecified whether recurrent (HCC)  Plan: Treatment Plan  Strengths/Abilities:  Intelligent, insightful  Treatment Preferences:  Outpatient Individual therapy  Statement of Needs:  "I need a therapist to help me with my depression"  Symptoms:  decreased motivation: (Status: improved). poor concentration: (Status: improved). poor sleep: (Status: improved).  Problems Addressed:  Unipolar depression  Goals: 1. New Goal Statement for Unipolar Depression  Describe current and past experiences with depression including their impact on functioning and  attempts to resolve it. Target Date: 2023-09-21 Frequency: monthly Progress: 60 Modality: individual  2.Identify and replace thoughts and beliefs that support depression. Target Date: 2023-09-21 Frequency: monthly Progress: 70 Modality: individual  3.  Learn and implement behavioral strategies to overcome depression. Target Date: 2023-09-21 Frequency: monthly Progress: 70 Modality: individual  4.  Learn and implement problem-solving and decision-making skills. Target Date: 2023-09-21 Frequency: monthly Progress: 90 Modality: individual   Interventions by Therapist:  CBT , insight oriented approach and problem solving therapy.  Treatment plan created with patient and  patient approves plan.  Adaliah Hiegel G Cherina Dhillon,  LCSW

## 2022-11-17 DIAGNOSIS — Z6820 Body mass index (BMI) 20.0-20.9, adult: Secondary | ICD-10-CM | POA: Diagnosis not present

## 2022-11-17 DIAGNOSIS — R1013 Epigastric pain: Secondary | ICD-10-CM | POA: Diagnosis not present

## 2022-11-17 DIAGNOSIS — Z0000 Encounter for general adult medical examination without abnormal findings: Secondary | ICD-10-CM | POA: Diagnosis not present

## 2022-11-17 DIAGNOSIS — E7800 Pure hypercholesterolemia, unspecified: Secondary | ICD-10-CM | POA: Diagnosis not present

## 2022-11-17 DIAGNOSIS — Z1389 Encounter for screening for other disorder: Secondary | ICD-10-CM | POA: Diagnosis not present

## 2022-11-17 DIAGNOSIS — D649 Anemia, unspecified: Secondary | ICD-10-CM | POA: Diagnosis not present

## 2022-11-17 DIAGNOSIS — R197 Diarrhea, unspecified: Secondary | ICD-10-CM | POA: Diagnosis not present

## 2022-11-22 ENCOUNTER — Ambulatory Visit: Payer: Medicare Other | Admitting: Podiatry

## 2022-11-23 ENCOUNTER — Other Ambulatory Visit: Payer: Self-pay | Admitting: Family Medicine

## 2022-11-23 DIAGNOSIS — R1084 Generalized abdominal pain: Secondary | ICD-10-CM

## 2022-11-23 DIAGNOSIS — R197 Diarrhea, unspecified: Secondary | ICD-10-CM

## 2022-11-25 ENCOUNTER — Ambulatory Visit (INDEPENDENT_AMBULATORY_CARE_PROVIDER_SITE_OTHER): Payer: Medicare Other | Admitting: Psychology

## 2022-11-25 DIAGNOSIS — F320 Major depressive disorder, single episode, mild: Secondary | ICD-10-CM

## 2022-11-25 NOTE — Progress Notes (Signed)
[facility] Counselor/Therapist Progress Note  Patient ID: Sylvia Maldonado, MRN: [id number],    Date: 11/25/2022  Time Spent: 50 minutes  Treatment Type: Individual Therapy  Reported Symptoms: depression  Mental Status Exam: Appearance:  Casual     Behavior: Appropriate  Motor: Normal  Speech/Language:  Normal Rate  Affect: Appropriate  Mood: normal  Thought process: normal  Thought content:   WNL  Sensory/Perceptual disturbances:   WNL  Orientation: oriented to person, place, time/date, and situation  Attention: Good  Concentration: Good  Memory: WNL  Fund of knowledge:  Good  Insight:   Good  Judgment:  Good  Impulse Control: Good   Risk Assessment: Danger to Self:  No Self-injurious Behavior: No Danger to Others: No Duty to Warn:no Physical Aggression / Violence:No  Access to Firearms a concern: No  Gang Involvement:No   Subjective: The patient attended an individual therapy session via video visit.  The patient gave verbal consent for the session to be the on video on WebEx.  The patient was in her home alone and the therapist was in the office.  The patient presents with a flat affect and mood is pleasant.    The patient has her colonoscopy and endoscopy scheduled next Tuesday.  The patient states that she feels like she is more ready to have that done now than she was last week.  We talked about what would likely happen and she seemed to be a little more comfortable with the process after we had the discussion.  In addition she talked about her granddaughter who is 71 who has announced that she is gay.  We discussed her feelings about that and she is feeling like her granddaughter is too young to make that decision.  I explained to her that the only thing she can do is her grandmother's love her and that her grow up and make the decision as she needs to.  The patient understood this but I think she was feeling angst because on some level she feels  like it needs to be controlled.  The patient seemed to understood the concepts discussed use cognitive behavioral therapy and some psychoeducation on human behavior.    Interventions: Cognitive Behavioral Therapy and Assertiveness/Communication  Diagnosis:Current mild episode of major depressive disorder, unspecified whether recurrent (HCC)  Plan: Treatment Plan  Strengths/Abilities:  Intelligent, insightful  Treatment Preferences:  Outpatient Individual therapy  Statement of Needs:  "I need a therapist to help me with my depression"  Symptoms:  decreased motivation: (Status: improved). poor concentration: (Status: improved). poor sleep: (Status: improved).  Problems Addressed:  Unipolar depression  Goals: 1. New Goal Statement for Unipolar Depression  Describe current and past experiences with depression including their impact on functioning and  attempts to resolve it. Target Date: 2023-09-21 Frequency: monthly Progress: 60 Modality: individual  2.Identify and replace thoughts and beliefs that support depression. Target Date: 2023-09-21 Frequency: monthly Progress: 70 Modality: individual  3.  Learn and implement behavioral strategies to overcome depression. Target Date: 2023-09-21 Frequency: monthly Progress: 70 Modality: individual  4.  Learn and implement problem-solving and decision-making skills. Target Date: 2023-09-21 Frequency: monthly Progress: 90 Modality: individual   Interventions by Therapist:  CBT , insight oriented approach and problem solving therapy.  Treatment plan created with patient and patient approves plan.  Malakhi Markwood G Delfino Friesen, LCSW

## 2022-11-30 DIAGNOSIS — D509 Iron deficiency anemia, unspecified: Secondary | ICD-10-CM | POA: Diagnosis not present

## 2022-11-30 DIAGNOSIS — K259 Gastric ulcer, unspecified as acute or chronic, without hemorrhage or perforation: Secondary | ICD-10-CM | POA: Diagnosis not present

## 2022-11-30 DIAGNOSIS — K6289 Other specified diseases of anus and rectum: Secondary | ICD-10-CM | POA: Diagnosis not present

## 2022-11-30 DIAGNOSIS — R197 Diarrhea, unspecified: Secondary | ICD-10-CM | POA: Diagnosis not present

## 2022-11-30 DIAGNOSIS — K5730 Diverticulosis of large intestine without perforation or abscess without bleeding: Secondary | ICD-10-CM | POA: Diagnosis not present

## 2022-11-30 DIAGNOSIS — D649 Anemia, unspecified: Secondary | ICD-10-CM | POA: Diagnosis not present

## 2022-11-30 DIAGNOSIS — K5710 Diverticulosis of small intestine without perforation or abscess without bleeding: Secondary | ICD-10-CM | POA: Diagnosis not present

## 2022-11-30 DIAGNOSIS — K648 Other hemorrhoids: Secondary | ICD-10-CM | POA: Diagnosis not present

## 2022-11-30 DIAGNOSIS — K529 Noninfective gastroenteritis and colitis, unspecified: Secondary | ICD-10-CM | POA: Diagnosis not present

## 2022-11-30 DIAGNOSIS — K2930 Chronic superficial gastritis without bleeding: Secondary | ICD-10-CM | POA: Diagnosis not present

## 2022-11-30 DIAGNOSIS — K3189 Other diseases of stomach and duodenum: Secondary | ICD-10-CM | POA: Diagnosis not present

## 2022-12-01 ENCOUNTER — Ambulatory Visit: Payer: Medicare Other | Admitting: Podiatry

## 2022-12-02 DIAGNOSIS — K2930 Chronic superficial gastritis without bleeding: Secondary | ICD-10-CM | POA: Diagnosis not present

## 2022-12-03 ENCOUNTER — Ambulatory Visit (INDEPENDENT_AMBULATORY_CARE_PROVIDER_SITE_OTHER): Payer: Medicare Other | Admitting: Podiatry

## 2022-12-03 ENCOUNTER — Encounter: Payer: Self-pay | Admitting: Podiatry

## 2022-12-03 DIAGNOSIS — M79675 Pain in left toe(s): Secondary | ICD-10-CM

## 2022-12-03 DIAGNOSIS — B351 Tinea unguium: Secondary | ICD-10-CM

## 2022-12-03 DIAGNOSIS — M79674 Pain in right toe(s): Secondary | ICD-10-CM

## 2022-12-03 NOTE — Progress Notes (Signed)
This patient returns to the office for evaluation and treatment of long thick painful nails .  This patient is unable to trim her own nails since the patient cannot reach her feet.  Patient says the nails are painful walking and wearing his shoes.  She returns for preventive foot care services.  General Appearance  Alert, conversant and in no acute stress.  Vascular  Dorsalis pedis and posterior tibial  pulses are palpable  bilaterally.  Capillary return is within normal limits  bilaterally. Temperature is within normal limits  bilaterally.  Neurologic  Senn-Weinstein monofilament wire test within normal limits  bilaterally. Muscle power within normal limits bilaterally.  Nails Thick disfigured discolored nails with subungual debris  from hallux to fifth toes bilaterally. No evidence of bacterial infection or drainage bilaterally.  Orthopedic  No limitations of motion  feet .  No crepitus or effusions noted.   HAV 1st MPJ  B/L.  Hammer toes 2-5  B/L. Plantar flexed fifth met  B/L.  Skin  normotropic skin with no porokeratosis noted bilaterally.  No signs of infections or ulcers noted.   Callus subfifth met  right foot.  Onychomycosis  Pain in toes right foot  Pain in toes left foot  Callus right forefoot.  Debridement  of nails  1-5  B/L with a nail nipper.  Nails were then filed using a dremel tool with no incidents. Debride porokeratosis with dremel tool. Calluses seem to be improving. RTC 10 weeks    Kerby Hockley DPM  

## 2022-12-09 ENCOUNTER — Ambulatory Visit (INDEPENDENT_AMBULATORY_CARE_PROVIDER_SITE_OTHER): Payer: Medicare Other | Admitting: Psychology

## 2022-12-09 DIAGNOSIS — F320 Major depressive disorder, single episode, mild: Secondary | ICD-10-CM

## 2022-12-09 NOTE — Progress Notes (Signed)
[facility] Counselor/Therapist Progress Note  Patient ID: Sylvia Maldonado, MRN: [id number],    Date: 12/09/2022  Time Spent: 50 minutes  Treatment Type: Individual Therapy  Reported Symptoms: depression  Mental Status Exam: Appearance:  Casual     Behavior: Appropriate  Motor: Normal  Speech/Language:  Normal Rate  Affect: Appropriate  Mood: normal  Thought process: normal  Thought content:   WNL  Sensory/Perceptual disturbances:   WNL  Orientation: oriented to person, place, time/date, and situation  Attention: Good  Concentration: Good  Memory: WNL  Fund of knowledge:  Good  Insight:   Good  Judgment:  Good  Impulse Control: Good   Risk Assessment: Danger to Self:  No Self-injurious Behavior: No Danger to Others: No Duty to Warn:no Physical Aggression / Violence:No  Access to Firearms a concern: No  Gang Involvement:No   Subjective: The patient attended an individual therapy session via video visit.  The patient gave verbal consent for the session to be the on video on caregility.  The patient was in her home alone and the therapist was in the office.  The patient presents with a flat affect and mood is pleasant.  The patient reports that she had her endoscopy and colonoscopy and they found that she has ulcers.  The patient states that she was put on Protonix twice a day.  She says that they would like to do a CT scan and she is not happy about that but she is planning to go ahead and do that and I encouraged her to do so as it would be helpful for her to know if there is anything else they need to do to help her feel better.  Her daughter is telling her that she really does not need to do those test however I think for the patient it would make her feel more secure to have further testing just in that she is fully taking care of.  She has a tendency to jump to the conclusion that she has cancer and she is still losing weight and I believe that it  probably related to the exercise and her diet.  Encouraged her to not get to jump to any conclusions and to follow-up so that she can have more confirmation that she is fine.    Interventions: Cognitive Behavioral Therapy and Assertiveness/Communication  Diagnosis:Current mild episode of major depressive disorder, unspecified whether recurrent (HCC)  Plan: Treatment Plan  Strengths/Abilities:  Intelligent, insightful  Treatment Preferences:  Outpatient Individual therapy  Statement of Needs:  "I need a therapist to help me with my depression"  Symptoms:  decreased motivation: (Status: improved). poor concentration: (Status: improved). poor sleep: (Status: improved).  Problems Addressed:  Unipolar depression  Goals: 1. New Goal Statement for Unipolar Depression  Describe current and past experiences with depression including their impact on functioning and  attempts to resolve it. Target Date: 2023-09-21 Frequency: monthly Progress: 60 Modality: individual  2.Identify and replace thoughts and beliefs that support depression. Target Date: 2023-09-21 Frequency: monthly Progress: 70 Modality: individual  3.  Learn and implement behavioral strategies to overcome depression. Target Date: 2023-09-21 Frequency: monthly Progress: 70 Modality: individual  4.  Learn and implement problem-solving and decision-making skills. Target Date: 2023-09-21 Frequency: monthly Progress: 90 Modality: individual   Interventions by Therapist:  CBT , insight oriented approach and problem solving therapy.  Treatment plan created with patient and patient approves plan.  Gust Eugene G Marua Qin, LCSW

## 2022-12-23 ENCOUNTER — Ambulatory Visit (INDEPENDENT_AMBULATORY_CARE_PROVIDER_SITE_OTHER): Payer: Medicare Other | Admitting: Psychology

## 2022-12-23 DIAGNOSIS — F320 Major depressive disorder, single episode, mild: Secondary | ICD-10-CM | POA: Diagnosis not present
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
## 2023-01-05 ENCOUNTER — Ambulatory Visit
Admission: RE | Admit: 2023-01-05 | Discharge: 2023-01-05 | Disposition: A | Discharge status: Home / Self Care | Payer: Medicare Other | Source: Ambulatory Visit | Attending: Family Medicine | Admitting: Family Medicine

## 2023-01-05 DIAGNOSIS — R197 Diarrhea, unspecified: Secondary | ICD-10-CM | POA: Diagnosis not present

## 2023-01-05 DIAGNOSIS — R1084 Generalized abdominal pain: Secondary | ICD-10-CM | POA: Diagnosis not present

## 2023-01-05 MED ORDER — IOPAMIDOL (ISOVUE-370) INJECTION 76%
75.0000 mL | Freq: Once | INTRAVENOUS | Status: AC | PRN
  Administered 2023-01-05: 75 mL via INTRAVENOUS

## 2023-01-06 ENCOUNTER — Ambulatory Visit (INDEPENDENT_AMBULATORY_CARE_PROVIDER_SITE_OTHER): Payer: Medicare Other | Admitting: Psychology

## 2023-01-06 DIAGNOSIS — F320 Major depressive disorder, single episode, mild: Secondary | ICD-10-CM

## 2023-01-06 NOTE — Progress Notes (Signed)
[facility] Counselor/Therapist Progress Note  Patient ID: Sylvia Maldonado, MRN: [id number],    Date: 01/06/2023  Time Spent: 50 minutes  Time in: 2:01  Time out:  2:51  Treatment Type: Individual Therapy  Reported Symptoms: depression  Mental Status Exam: Appearance:  Casual     Behavior: Appropriate  Motor: Normal  Speech/Language:  Normal Rate  Affect: Appropriate  Mood: normal  Thought process: normal  Thought content:   WNL  Sensory/Perceptual disturbances:   WNL  Orientation: oriented to person, place, time/date, and situation  Attention: Good  Concentration: Good  Memory: WNL  Fund of knowledge:  Good  Insight:   Good  Judgment:  Good  Impulse Control: Good   Risk Assessment: Danger to Self:  No Self-injurious Behavior: No Danger to Others: No Duty to Warn:no Physical Aggression / Violence:No  Access to Firearms a concern: No  Gang Involvement:No   Subjective: The patient attended an individual therapy session via video visit.  The patient gave verbal consent for the session to be the on video on caregility and is aware of the limitations of telehealth.  The patient was in her home alone and the therapist was in the office.  The patient presents with a flat affect and mood is pleasant.  The patient talked today about being upset because she felt like she was being forced to have the CT scan that she had.  I worked with her to reframe those thoughts.  We talked about the importance of her having the test because that way she will not go if she is for sure okay or not I explained to her that in lots of ways she causes her own depression and anxiety.  We did some work at changing her negative thoughts and talked about the importance of this.  In addition we talked about her feeling like she is losing her independence because of her age.  I explained to her that we all had a tendency to get to the place where he lives some of her independence and that  the way to handle that is to be less resistant to suggestions and keeping herself as healthy as possible and that includes getting test so that she can know what is going on with her when the time comes.    Interventions: Cognitive Behavioral Therapy and Assertiveness/Communication  Diagnosis:Current mild episode of major depressive disorder, unspecified whether recurrent (HCC)  Plan: Treatment Plan  Strengths/Abilities:  Intelligent, insightful  Treatment Preferences:  Outpatient Individual therapy  Statement of Needs:  "I need a therapist to help me with my depression"  Symptoms:  decreased motivation: (Status: improved). poor concentration: (Status: improved). poor sleep: (Status: improved).  Problems Addressed:  Unipolar depression  Goals: 1. New Goal Statement for Unipolar Depression  Describe current and past experiences with depression including their impact on functioning and  attempts to resolve it. Target Date: 2023-09-21 Frequency: bi weekly Progress: 60 Modality: individual  2.Identify and replace thoughts and beliefs that support depression. Target Date: 2023-09-21 Frequency: bi weekly Progress: 70 Modality: individual  3.  Learn and implement behavioral strategies to overcome depression. Target Date: 2023-09-21 Frequency: bi weekly Progress: 70 Modality: individual  4.  Learn and implement problem-solving and decision-making skills. Target Date: 2023-09-21 Frequency: bi weekly Progress: 90 Modality: individual   Interventions by Therapist:  CBT , insight oriented approach and problem solving therapy.  Treatment plan created with patient and patient approves plan.  Zira Helinski G Shyann Hefner, LCSW

## 2023-01-20 ENCOUNTER — Ambulatory Visit: Payer: Medicare Other | Admitting: Psychology

## 2023-01-24 DIAGNOSIS — K5901 Slow transit constipation: Secondary | ICD-10-CM | POA: Diagnosis not present

## 2023-01-24 DIAGNOSIS — Z6821 Body mass index (BMI) 21.0-21.9, adult: Secondary | ICD-10-CM | POA: Diagnosis not present

## 2023-01-27 ENCOUNTER — Ambulatory Visit (INDEPENDENT_AMBULATORY_CARE_PROVIDER_SITE_OTHER): Payer: Medicare Other | Admitting: Psychology

## 2023-01-27 DIAGNOSIS — F320 Major depressive disorder, single episode, mild: Secondary | ICD-10-CM
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
## 2023-02-03 ENCOUNTER — Ambulatory Visit: Payer: Medicare Other | Admitting: Psychology

## 2023-02-03 DIAGNOSIS — F320 Major depressive disorder, single episode, mild: Secondary | ICD-10-CM

## 2023-02-03 NOTE — Progress Notes (Signed)
[facility] Counselor/Therapist Progress Note  Patient ID: Sylvia Maldonado, MRN: [id number],    Date: 02/03/2023  Time Spent: 49 minutes  Time in: 2:01  Time out:  2:50  Treatment Type: Individual Therapy  Reported Symptoms: depression  Mental Status Exam: Appearance:  Casual     Behavior: Appropriate  Motor: Normal  Speech/Language:  Normal Rate  Affect: Appropriate  Mood: normal  Thought process: normal  Thought content:   WNL  Sensory/Perceptual disturbances:   WNL  Orientation: oriented to person, place, time/date, and situation  Attention: Good  Concentration: Good  Memory: WNL  Fund of knowledge:  Good  Insight:   Good  Judgment:  Good  Impulse Control: Good   Risk Assessment: Danger to Self:  No Self-injurious Behavior: No Danger to Others: No Duty to Warn:no Physical Aggression / Violence:No  Access to Firearms a concern: No  Gang Involvement:No   Subjective: The patient attended an individual therapy session via video visit.  The patient gave verbal consent for the session to be the on video on caregility and is aware of the limitations of telehealth.  The patient was in her home alone and the therapist was in the office.  The patient presents with a flat affect and mood is pleasant.  The patient was pleasant and reported that she was asked out on a date while she was at the gym.  She talked about this and we talked about how exciting it was that she was asking out and apparently she decided to go.  We discussed how to move forward with this because it has been a while since she has been in a relationship.  In addition the patient talked today about politics and she has decided to that democratic as opposed to republican.  I encouraged her to vote what she felt was the best for her.  She is stressed by the political scene and we talked about how to stay as neutral as possible during this time.   Interventions: Cognitive Behavioral Therapy and  Assertiveness/Communication  Diagnosis:Current mild episode of major depressive disorder, unspecified whether recurrent (HCC)  Plan: Treatment Plan  Strengths/Abilities:  Intelligent, insightful  Treatment Preferences:  Outpatient Individual therapy  Statement of Needs:  "I need a therapist to help me with my depression"  Symptoms:  decreased motivation: (Status: improved). poor concentration: (Status: improved). poor sleep: (Status: improved).  Problems Addressed:  Unipolar depression  Goals: 1. New Goal Statement for Unipolar Depression  Describe current and past experiences with depression including their impact on functioning and  attempts to resolve it. Target Date: 2023-09-21 Frequency: bi weekly Progress: 60 Modality: individual  2.Identify and replace thoughts and beliefs that support depression. Target Date: 2023-09-21 Frequency: bi weekly Progress: 70 Modality: individual  3.  Learn and implement behavioral strategies to overcome depression. Target Date: 2023-09-21 Frequency: bi weekly Progress: 70 Modality: individual  4.  Learn and implement problem-solving and decision-making skills. Target Date: 2023-09-21 Frequency: bi weekly Progress: 90 Modality: individual   Interventions by Therapist:  CBT , insight oriented approach and problem solving therapy.  Treatment plan created with patient and patient approves plan.  Loye Vento G Serrita Lueth, LCSW

## 2023-02-15 ENCOUNTER — Encounter (HOSPITAL_COMMUNITY): Payer: Self-pay | Admitting: Psychiatry

## 2023-02-15 ENCOUNTER — Other Ambulatory Visit: Payer: Self-pay

## 2023-02-15 ENCOUNTER — Ambulatory Visit (HOSPITAL_COMMUNITY): Payer: Medicare Other | Admitting: Psychiatry

## 2023-02-15 VITALS — BP 143/73 | HR 65 | Ht 65.0 in | Wt 118.0 lb

## 2023-02-15 DIAGNOSIS — F325 Major depressive disorder, single episode, in full remission: Secondary | ICD-10-CM

## 2023-02-15 MED ORDER — CITALOPRAM HYDROBROMIDE 20 MG PO TABS: 20.0000 mg | ORAL_TABLET | Freq: Every day | ORAL | 4 refills | Status: DC

## 2023-02-15 MED ORDER — ARIPIPRAZOLE 5 MG PO TABS: ORAL_TABLET | ORAL | 3 refills | Status: DC

## 2023-02-15 NOTE — Progress Notes (Signed)
Patient ID: Sylvia Maldonado, female   DOB: 12/19/1940, 82 y.o.   MRN: [id number] [facility] MD Progress Note  02/15/2023 2:38 PM Sylvia Maldonado  MRN:  [id number] Subjective: I am feel awful; I feel depressed. Principal Problem: Major depression in remission Diagnosis: Major depression recurrent     Today the patient is doing well except she has some small movements in her right hand.  This seems to be consistent with early tardive dyskinesia.  She had an aims scale last visit with showed no evidence and today she had another rating scale that really did not show any evidence on exam except it is evident in her right hand and a little bit in her left she has some movements.  They are new to her and they are bothersome.  She automatically knew they might be due to her medications.  Over the last few years we have talked about the risk of tardive dyskinesia we have even stopped the Abilify at 1 point but her depression came back quickly.  When we restarted Abilify her mood got much better and she went back to baseline.  The truth is that this time her mood is good.  She is at her baseline.  Today she agreed with me to reduce Abilify from 10 mg down to 5 mg and return in 4 weeks.  At that time I will discontinue the Abilify.  The patient's mood is good and she will continue taking Celexa 20 mg.  In some ways she has forgotten about this discussion about the Abilify being an adjunct treatment that does not have to be long-term.  I brought up in the past to taper her off and she has been resistant.  She is no longer resistant.  She is bothered by the movements as other people can see them.  The patient continues in one-to-one therapy.  She has resigned from any work.  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  Now she continues to be very physically active.  She walks 1 to 2 miles every day and is in 3 different classes that are exercise classes.  Everything is stable.  I do not think  Narda Rutherford is necessary at this time.  I think first we have to taper off the medication.  It is possible that her TD will actually get worse before it gets better but we will see in a month. Past Surgical History:  Procedure Laterality Date   cataracts surg     COLONOSCOPY     EYE SURGERY     PELVIC FLOOR REPAIR     TONSILLECTOMY     VAGINAL HYSTERECTOMY     Family History:  Family History  Problem Relation Age of Onset   CAD Neg Hx    Family Psychiatric  History:  Social History:  Social History   Substance and Sexual Activity  Alcohol Use No     Social History   Substance and Sexual Activity  Drug Use No    Social History   Socioeconomic History   Marital status: Single    Spouse name: Not on file   Number of children: Not on file   Years of education: Not on file   Highest education level: Not on file  Occupational History   Not on file  Tobacco Use   Smoking status: Never   Smokeless tobacco: Never  Vaping Use   Vaping status: Never Used  Substance and Sexual Activity  Alcohol use: No   Drug use: No   Sexual activity: Not Currently  Other Topics Concern   Not on file  Social History Narrative   Right handed    Lives alone at home    No caffeine usage    Social Determinants of Health   Financial Resource Strain: Not on file  Food Insecurity: Not on file  Transportation Needs: Not on file  Physical Activity: Not on file  Stress: Not on file  Social Connections: Not on file   Additional Social History:                         Sleep: Good  Appetite:  Good  Current Medications: Current Outpatient Medications  Medication Sig Dispense Refill   Cholecalciferol (VITAMIN D-3) 1000 UNITS CAPS Take 2,000 Units by mouth daily.     cycloSPORINE (RESTASIS) 0.05 % ophthalmic emulsion Place 1 drop into both eyes daily.      gabapentin (NEURONTIN) 100 MG capsule Take 100 mg by mouth at bedtime.     loteprednol (LOTEMAX) 0.5 % ophthalmic  suspension 1 drop into affected eye     Magnesium 200 MG TABS 1 tablet with a meal     pantoprazole (PROTONIX) 40 MG tablet Take 40 mg by mouth daily.     SHINGRIX injection      Turmeric 500 MG CAPS 1 capsule     vitamin B-12 (CYANOCOBALAMIN) 1000 MCG tablet Take 1,000 mcg by mouth daily.     ARIPiprazole (ABILIFY) 5 MG tablet 1  qam 30 tablet 3   citalopram (CELEXA) 20 MG tablet Take 1 tablet (20 mg total) by mouth daily. 90 tablet 4   No current facility-administered medications for this visit.    Lab Results: No results found for this or any previous visit (from the past 48 hour(s)).  Physical Findings: AIMS:  , ,  ,  ,    CIWA:    COWS:     Musculoskeletal: Strength & Muscle Tone: within normal limits Gait & Station: normal Patient leans: Right  Psychiatric Specialty Exam: ROS  Blood pressure (!) 143/73, pulse 65, height 5\' 5"  (1.651 m), weight 118 lb (53.5 kg).Body mass index is 19.64 kg/m.  General Appearance: Casual  Eye Contact::  Good  Speech:  Clear and Coherent  Volume:  Normal  Mood:  NA  Affect:  Congruent  Thought Process:  Coherent  Orientation:  Full (Time, Place, and Person)  Thought Content:  WDL  Suicidal Thoughts:  No  Homicidal Thoughts:  No  Memory:  NA  Judgement:  NA  Insight:  Good  Psychomotor Activity:  Normal  Concentration:  Good  Recall:  Good  Fund of Knowledge:Good  Language: Good  Akathisia:  No  Handed:  Right  AIMS (if indicated):     Assets:  Desire for Improvement  ADL's:  Intact  Cognition: WNL  Sleep:       [name] 02/15/2023, 2:38 PM  Major depression in remission.  The patient also has small low-level tardive dyskinesia.  At this time we will reduce her Abilify down from 10 mg to 5 mg and when she returns in 4 weeks we will likely discontinue it.  The patient continue taking Celexa as ordered and continue in one-to-one therapy.  I connected with Ellyette Engblom Valverde on 02/15/23 at  2:00 PM EDT by telephone  and verified that I am speaking with the correct person using two identifiers.  Location: Patient: home Provider: office   I discussed the limitations, risks, security and privacy concerns of performing an evaluation and management service by telephone and the availability of in person appointments. I also discussed with the patient that there may be a patient responsible charge related to this service. The patient expressed understanding and agreed to proceed.      I discussed the assessment and treatment plan with the patient. The patient was provided an opportunity to ask questions and all were answered. The patient agreed with the plan and demonstrated an understanding of the instructions.   The patient was advised to call back or seek an in-person evaluation if the symptoms worsen or if the condition fails to improve as anticipated.  I provided 30 minutes of non-face-to-face time during this encounter.   [name], MD

## 2023-02-17 ENCOUNTER — Ambulatory Visit (INDEPENDENT_AMBULATORY_CARE_PROVIDER_SITE_OTHER): Payer: Medicare Other | Admitting: Psychology

## 2023-02-17 DIAGNOSIS — F325 Major depressive disorder, single episode, in full remission: Secondary | ICD-10-CM | POA: Diagnosis not present

## 2023-02-17 NOTE — Progress Notes (Addendum)
[facility] Counselor/Therapist Progress Note  Patient ID: Sylvia Maldonado, MRN: [id number],    Date: 02/17/2023  Time Spent: 50 minutes  Time in: 2:01  Time out:  2:51  Treatment Type: Individual Therapy  Reported Symptoms: depression  Mental Status Exam: Appearance:  Casual     Behavior: Appropriate  Motor: Normal  Speech/Language:  Normal Rate  Affect: Appropriate  Mood: normal  Thought process: normal  Thought content:   WNL  Sensory/Perceptual disturbances:   WNL  Orientation: oriented to person, place, time/date, and situation  Attention: Good  Concentration: Good  Memory: WNL  Fund of knowledge:  Good  Insight:   Good  Judgment:  Good  Impulse Control: Good   Risk Assessment: Danger to Self:  No Self-injurious Behavior: No Danger to Others: No Duty to Warn:no Physical Aggression / Violence:No  Access to Firearms a concern: No  Gang Involvement:No   Subjective: The patient attended an individual therapy session via video visit.  The patient gave verbal consent for the session to be the on video on caregility and is aware of the limitations of telehealth.  The patient was in her home alone and the therapist was in the office.  The patient presents with a flat affect and mood is pleasant.  The patient reports that she went on her date on Monday and it was pleasant but she felt like she was being interviewed.  I asked her how she liked him and she really could not say she said that she did not remember a lot about the date because she was nervous.  We talked about him asking her out on another date and I asked if she was going to go and she stated that she would probably go but she is not sure that that is going to be the thing for her.  I told her that maybe she should just give it another chance because they do not know each other very well yet.  The patient did report that she told her daughter about changing her boat this coming election and her  daughter was thrilled about that and she felt like that was a good thing to do to support her daughter.  I encouraged her and gave her some feedback on how to have conversation with this gentleman if she goes out on another date.    Interventions: Cognitive Behavioral Therapy and Assertiveness/Communication  Diagnosis:Major depressive disorder with single episode, in full remission [facility])  Plan: Treatment Plan  Strengths/Abilities:  Intelligent, insightful  Treatment Preferences:  Outpatient Individual therapy  Statement of Needs:  "I need a therapist to help me with my depression"  Symptoms:  decreased motivation: (Status: improved). poor concentration: (Status: improved). poor sleep: (Status: improved).  Problems Addressed:  Unipolar depression  Goals: 1. New Goal Statement for Unipolar Depression  Describe current and past experiences with depression including their impact on functioning and  attempts to resolve it. Target Date: 2023-09-21 Frequency: bi weekly Progress: 60 Modality: individual  2.Identify and replace thoughts and beliefs that support depression. Target Date: 2023-09-21 Frequency: bi weekly Progress: 70 Modality: individual  3.  Learn and implement behavioral strategies to overcome depression. Target Date: 2023-09-21 Frequency: bi weekly Progress: 70 Modality: individual  4.  Learn and implement problem-solving and decision-making skills. Target Date: 2023-09-21 Frequency: bi weekly Progress: 90 Modality: individual   Interventions by Therapist:  CBT , insight oriented approach and problem solving therapy.  Treatment plan created with patient and patient approves  plan.  [name], LCSW

## 2023-03-03 ENCOUNTER — Ambulatory Visit (INDEPENDENT_AMBULATORY_CARE_PROVIDER_SITE_OTHER): Payer: Medicare Other | Admitting: Psychology

## 2023-03-03 DIAGNOSIS — F325 Major depressive disorder, single episode, in full remission: Secondary | ICD-10-CM | POA: Diagnosis not present

## 2023-03-03 NOTE — Progress Notes (Signed)
[facility] Counselor/Therapist Progress Note  Patient ID: Sylvia Maldonado, MRN: [id number],    Date: 03/03/2023  Time Spent: 49 minutes  Time in: 2:00  Time out:  2:49  Treatment Type: Individual Therapy  Reported Symptoms: depression  Mental Status Exam: Appearance:  Casual     Behavior: Appropriate  Motor: Normal  Speech/Language:  Normal Rate  Affect: Appropriate  Mood: normal  Thought process: normal  Thought content:   WNL  Sensory/Perceptual disturbances:   WNL  Orientation: oriented to person, place, time/date, and situation  Attention: Good  Concentration: Good  Memory: WNL  Fund of knowledge:  Good  Insight:   Good  Judgment:  Good  Impulse Control: Good   Risk Assessment: Danger to Self:  No Self-injurious Behavior: No Danger to Others: No Duty to Warn:no Physical Aggression / Violence:No  Access to Firearms a concern: No  Gang Involvement:No   Subjective: The patient attended an individual therapy session via video visit.  The patient gave verbal consent for the session to be the on video on caregility and is aware of the limitations of telehealth.  The patient was in her home alone and the therapist was in the office.  The patient presents with a flat affect and mood is pleasant.  The patient reports that she did go out on the second day with the guys she met walking on the track.  She felt good about it and she said she had a very nice time.  One of the issues that came out of going out on the date was that he apparently approached her granddaughter and told her granddaughter that he knew about her and where she went to school and those sorts of things and the granddaughter got very upset with the patient.  The granddaughter apparently made a scene and yelled and we talked about the fact that the granddaughter was disrespectful of her in front of people.  The patient wanted to know how to handle the situation and I recommended that she have  a conversation with the granddaughter and explained what happened and why it was disrespectful for her to respond that way.  I did reframe things and try to help the patient understand that some of it may have been caused by how the gentleman introduced himself to the granddaughter.  We talked about specific words to use and how to say it.  The patient thought that maybe she should texted and I explained to her that it would probably be better for her to speak to her in person because she wants to teach her that dealing with conflict is okay and that people can work through conflict.   Interventions: Cognitive Behavioral Therapy and Assertiveness/Communication  Diagnosis:Major depressive disorder with single episode, in full remission [facility])  Plan: Treatment Plan  Strengths/Abilities:  Intelligent, insightful  Treatment Preferences:  Outpatient Individual therapy  Statement of Needs:  "I need a therapist to help me with my depression"  Symptoms:  decreased motivation: (Status: improved). poor concentration: (Status: improved). poor sleep: (Status: improved).  Problems Addressed:  Unipolar depression  Goals: 1. New Goal Statement for Unipolar Depression  Describe current and past experiences with depression including their impact on functioning and  attempts to resolve it. Target Date: 2023-09-21 Frequency: bi weekly Progress: 60 Modality: individual  2.Identify and replace thoughts and beliefs that support depression. Target Date: 2023-09-21 Frequency: bi weekly Progress: 70 Modality: individual  3.  Learn and implement behavioral strategies to overcome  depression. Target Date: 2023-09-21 Frequency: bi weekly Progress: 70 Modality: individual  4.  Learn and implement problem-solving and decision-making skills. Target Date: 2023-09-21 Frequency: bi weekly Progress: 90 Modality: individual   Interventions by Therapist:  CBT , insight oriented approach and problem solving therapy.   Treatment plan created with patient and patient approves plan.  Navin Dogan G Angelee Bahr, LCSW

## 2023-03-08 ENCOUNTER — Ambulatory Visit: Payer: Medicare Other | Admitting: Podiatry

## 2023-03-08 ENCOUNTER — Encounter: Payer: Self-pay | Admitting: Podiatry

## 2023-03-08 DIAGNOSIS — B351 Tinea unguium: Secondary | ICD-10-CM | POA: Diagnosis not present

## 2023-03-08 DIAGNOSIS — M79674 Pain in right toe(s): Secondary | ICD-10-CM | POA: Diagnosis not present

## 2023-03-08 DIAGNOSIS — M79675 Pain in left toe(s): Secondary | ICD-10-CM | POA: Diagnosis not present

## 2023-03-08 NOTE — Progress Notes (Signed)
This patient returns to the office for evaluation and treatment of long thick painful nails .  This patient is unable to trim her own nails since the patient cannot reach her feet.  Patient says the nails are painful walking and wearing his shoes.  She returns for preventive foot care services.  General Appearance  Alert, conversant and in no acute stress.  Vascular  Dorsalis pedis and posterior tibial  pulses are palpable  bilaterally.  Capillary return is within normal limits  bilaterally. Temperature is within normal limits  bilaterally.  Neurologic  Senn-Weinstein monofilament wire test within normal limits  bilaterally. Muscle power within normal limits bilaterally.  Nails Thick disfigured discolored nails with subungual debris  from hallux to fifth toes bilaterally. No evidence of bacterial infection or drainage bilaterally.  Orthopedic  No limitations of motion  feet .  No crepitus or effusions noted.   HAV 1st MPJ  B/L.  Hammer toes 2-5  B/L. Plantar flexed fifth met  B/L.  Skin  normotropic skin with no porokeratosis noted bilaterally.  No signs of infections or ulcers noted.   Callus subfifth met  right foot.  Onychomycosis  Pain in toes right foot  Pain in toes left foot   Debridement  of nails  1-5  B/L with a nail nipper.  Nails were then filed using a dremel tool with no incidents. RTC 10 weeks    Helane Gunther DPM

## 2023-03-15 ENCOUNTER — Ambulatory Visit (HOSPITAL_BASED_OUTPATIENT_CLINIC_OR_DEPARTMENT_OTHER): Payer: Medicare Other | Admitting: Psychiatry

## 2023-03-15 ENCOUNTER — Encounter (HOSPITAL_COMMUNITY): Payer: Self-pay | Admitting: Psychiatry

## 2023-03-15 ENCOUNTER — Other Ambulatory Visit: Payer: Self-pay

## 2023-03-15 VITALS — BP 149/70 | HR 56 | Ht 65.0 in | Wt 120.0 lb

## 2023-03-15 DIAGNOSIS — F3342 Major depressive disorder, recurrent, in full remission: Secondary | ICD-10-CM | POA: Diagnosis not present

## 2023-03-15 MED ORDER — CITALOPRAM HYDROBROMIDE 20 MG PO TABS: 20.0000 mg | ORAL_TABLET | Freq: Every day | ORAL | 4 refills | Status: DC

## 2023-03-15 MED ORDER — ARIPIPRAZOLE 2 MG PO TABS: ORAL_TABLET | ORAL | 0 refills | Status: DC

## 2023-03-15 NOTE — Progress Notes (Signed)
Patient ID: Sylvia Maldonado, female   DOB: 03-12-1941, 82 y.o.   MRN: [id number] [facility] MD Progress Note  03/15/2023 4:27 PM [name]  MRN:  [id number] Subjective: I am feel awful; I feel depressed. Principal Problem: Major depression in remission Diagnosis: Major depression recurrent  Today the patient is seen in the office and is actually doing quite well.  Her mood is great.  She feels full of life.  She is very happy.  This is good since we reduced her Abilify from 10 to 5 mg and she noticed no difference.  Further she says a little bit of movements that she had are improved.  She feels better about them.  They are minor.  Today she had a Aim scale but demonstrated minimal tardive dyskinesia.  It mainly was evident in her right hand but she apparently even noticed it.  She is sleeping and eating well.  She continues to exercise regularly.  Her family is doing great.  She denies use of alcohol or substances.  She never had any evidence of psychosis.  She is doing very well on the Abilify at the lower dose.              Past Surgical History:  Procedure Laterality Date   cataracts surg     COLONOSCOPY     EYE SURGERY     PELVIC FLOOR REPAIR     TONSILLECTOMY     VAGINAL HYSTERECTOMY     Family History:  Family History  Problem Relation Age of Onset   CAD Neg Hx    Family Psychiatric  History:  Social History:  Social History   Substance and Sexual Activity  Alcohol Use No     Social History   Substance and Sexual Activity  Drug Use No    Social History   Socioeconomic History   Marital status: Single    Spouse name: Not on file   Number of children: Not on file   Years of education: Not on file   Highest education level: Not on file  Occupational History   Not on file  Tobacco Use   Smoking status: Never   Smokeless tobacco: Never  Vaping Use   Vaping status: Never Used  Substance and Sexual Activity   Alcohol use: No   Drug use: No    Sexual activity: Not Currently  Other Topics Concern   Not on file  Social History Narrative   Right handed    Lives alone at home    No caffeine usage    Social Determinants of Health   Financial Resource Strain: Not on file  Food Insecurity: Not on file  Transportation Needs: Not on file  Physical Activity: Not on file  Stress: Not on file  Social Connections: Not on file   Additional Social History:                         Sleep: Good  Appetite:  Good  Current Medications: Current Outpatient Medications  Medication Sig Dispense Refill   Cholecalciferol (VITAMIN D-3) 1000 UNITS CAPS Take 2,000 Units by mouth daily.     citalopram (CELEXA) 20 MG tablet Take 1 tablet (20 mg total) by mouth daily. 90 tablet 4   cycloSPORINE (RESTASIS) 0.05 % ophthalmic emulsion Place 1 drop into both eyes daily.      Magnesium 200 MG TABS 1 tablet with a meal     SHINGRIX injection  vitamin B-12 (CYANOCOBALAMIN) 1000 MCG tablet Take 1,000 mcg by mouth daily.     ARIPiprazole (ABILIFY) 2 MG tablet 1  qam 14 tablet 0   gabapentin (NEURONTIN) 100 MG capsule Take 100 mg by mouth at bedtime.     loteprednol (LOTEMAX) 0.5 % ophthalmic suspension 1 drop into affected eye     pantoprazole (PROTONIX) 40 MG tablet Take 40 mg by mouth daily.     Turmeric 500 MG CAPS 1 capsule     No current facility-administered medications for this visit.    Lab Results: No results found for this or any previous visit (from the past 48 hour(s)).  Physical Findings: AIMS:  , ,  ,  ,    CIWA:    COWS:     Musculoskeletal: Strength & Muscle Tone: within normal limits Gait & Station: normal Patient leans: Right  Psychiatric Specialty Exam: ROS  Blood pressure (!) 149/70, pulse (!) 56, height 5\' 5"  (1.651 m), weight 120 lb (54.4 kg).Body mass index is 19.97 kg/m.  General Appearance: Casual  Eye Contact::  Good  Speech:  Clear and Coherent  Volume:  Normal  Mood:  NA  Affect:  Congruent   Thought Process:  Coherent  Orientation:  Full (Time, Place, and Person)  Thought Content:  WDL  Suicidal Thoughts:  No  Homicidal Thoughts:  No  Memory:  NA  Judgement:  NA  Insight:  Good  Psychomotor Activity:  Normal  Concentration:  Good  Recall:  Good  Fund of Knowledge:Good  Language: Good  Akathisia:  No  Handed:  Right  AIMS (if indicated):     Assets:  Desire for Improvement  ADL's:  Intact  Cognition: WNL  Sleep:       [name] 03/15/2023, 4:27 PM   Today the patient is doing well.  Today we talked about the process of going ahead and removing her Abilify.  Will reduce it from 5 mg down to 2 mg and she will take it for 2 weeks.  Then she will discontinue it.  She will see me back in approximately 2 months from now.  Fortunately she is demonstrating no withdrawal from the drop in Abilify.  We will discontinue it slowly.  I shared with her to continue taking her Celexa as prescribed.  She was told to call here if there are any problems.  At this time her movements are minimal and expectations are that when I see her back in about 9 weeks hopefully though you would be less detectable.  In the last the patient is not distressed at all or making movements as it certainly is not something that she notices or affects her functioning.  Fortunately would pick it up early and evidenced on exam a few months ago.  Now it appears to be less.  The patient is very stable. I connected with Katrinia Lindenbaum Chachere on 03/15/23 at  4:00 PM EDT by telephone and verified that I am speaking with the correct person using two identifiers.  Location: Patient: home Provider: office   I discussed the limitations, risks, security and privacy concerns of performing an evaluation and management service by telephone and the availability of in person appointments. I also discussed with the patient that there may be a patient responsible charge related to this service. The patient expressed  understanding and agreed to proceed.      I discussed the assessment and treatment plan with the patient. The patient was provided an opportunity  to ask questions and all were answered. The patient agreed with the plan and demonstrated an understanding of the instructions.   The patient was advised to call back or seek an in-person evaluation if the symptoms worsen or if the condition fails to improve as anticipated.  I provided 30 minutes of non-face-to-face time during this encounter.   [name], MD

## 2023-03-17 ENCOUNTER — Ambulatory Visit (INDEPENDENT_AMBULATORY_CARE_PROVIDER_SITE_OTHER): Payer: Medicare Other | Admitting: Psychology

## 2023-03-17 DIAGNOSIS — F3342 Major depressive disorder, recurrent, in full remission: Secondary | ICD-10-CM

## 2023-03-17 NOTE — Progress Notes (Signed)
[facility] Counselor/Therapist Progress Note  Patient ID: Sylvia Maldonado, MRN: [id number],    Date: 03/17/2023  Time Spent: 49 minutes  Time in: 2:00  Time out:  2:49  Treatment Type: Individual Therapy  Reported Symptoms: depression  Mental Status Exam: Appearance:  Casual     Behavior: Appropriate  Motor: Normal  Speech/Language:  Normal Rate  Affect: Appropriate  Mood: normal  Thought process: normal  Thought content:   WNL  Sensory/Perceptual disturbances:   WNL  Orientation: oriented to person, place, time/date, and situation  Attention: Good  Concentration: Good  Memory: WNL  Fund of knowledge:  Good  Insight:   Good  Judgment:  Good  Impulse Control: Good   Risk Assessment: Danger to Self:  No Self-injurious Behavior: No Danger to Others: No Duty to Warn:no Physical Aggression / Violence:No  Access to Firearms a concern: No  Gang Involvement:No   Subjective: The patient attended an individual therapy session via video visit.  The patient gave verbal consent for the session to be the on video on caregility and is aware of the limitations of telehealth.  The patient was in her home alone and the therapist was in the office.  The patient presents with a flat affect and mood is pleasant.  The patient reports that she has had a little bit of a difficult 2 weeks because she and her daughter are having difficulty because of what happened with her granddaughter.  She states that [name] did not feel that what [name] did was problematic at all.  We talked about it and we talked about how she is planning to handle it and I recommended that she probably just move forward.  She is concerned that her daughter [name] will tell Aurther Loft what happened and I explained to the patient that she probably needs to explained to Terri her side of the story.  I would also explain that it was okay for [name] to express herself but I would have preferred it in a more  appropriate way.  The patient is getting ready to go on her trip to [facility] soon and is looking forward to that and she seems to be doing well as far as her depression.    Interventions: Cognitive Behavioral Therapy and Assertiveness/Communication  Diagnosis:Recurrent major depressive disorder, in full remission (HCC)  Plan: Treatment Plan  Strengths/Abilities:  Intelligent, insightful  Treatment Preferences:  Outpatient Individual therapy  Statement of Needs:  "I need a therapist to help me with my depression"  Symptoms:  decreased motivation: (Status: improved). poor concentration: (Status: improved). poor sleep: (Status: improved).  Problems Addressed:  Unipolar depression  Goals: 1. New Goal Statement for Unipolar Depression  Describe current and past experiences with depression including their impact on functioning and  attempts to resolve it. Target Date: 2023-09-21 Frequency: bi weekly Progress: 60 Modality: individual  2.Identify and replace thoughts and beliefs that support depression. Target Date: 2023-09-21 Frequency: bi weekly Progress: 70 Modality: individual  3.  Learn and implement behavioral strategies to overcome depression. Target Date: 2023-09-21 Frequency: bi weekly Progress: 70 Modality: individual  4.  Learn and implement problem-solving and decision-making skills. Target Date: 2023-09-21 Frequency: bi weekly Progress: 90 Modality: individual   Interventions by Therapist:  CBT , insight oriented approach and problem solving therapy.  Treatment plan created with patient and patient approves plan.  Chenille Toor G Jefferey Lippmann, LCSW

## 2023-03-18 DIAGNOSIS — Z23 Encounter for immunization: Secondary | ICD-10-CM | POA: Diagnosis not present

## 2023-03-31 ENCOUNTER — Ambulatory Visit (INDEPENDENT_AMBULATORY_CARE_PROVIDER_SITE_OTHER): Payer: Medicare Other | Admitting: Psychology

## 2023-03-31 DIAGNOSIS — F320 Major depressive disorder, single episode, mild: Secondary | ICD-10-CM

## 2023-03-31 NOTE — Progress Notes (Signed)
[facility] Counselor/Therapist Progress Note  Patient ID: Sylvia Maldonado, MRN: [id number],    Date: 03/31/2023  Time Spent: 52 minutes  Time in: 2:00  Time out:  2:52  Treatment Type: Individual Therapy  Reported Symptoms: depression  Mental Status Exam: Appearance:  Casual     Behavior: Appropriate  Motor: Normal  Speech/Language:  Normal Rate  Affect: Appropriate  Mood: normal  Thought process: normal  Thought content:   WNL  Sensory/Perceptual disturbances:   WNL  Orientation: oriented to person, place, time/date, and situation  Attention: Good  Concentration: Good  Memory: WNL  Fund of knowledge:  Good  Insight:   Good  Judgment:  Good  Impulse Control: Good   Risk Assessment: Danger to Self:  No Self-injurious Behavior: No Danger to Others: No Duty to Warn:no Physical Aggression / Violence:No  Access to Firearms a concern: No  Gang Involvement:No   Subjective: The patient attended an individual therapy session via video visit.  The patient gave verbal consent for the session to be the on video on caregility and is aware of the limitations of telehealth.  The patient was in her home alone and the therapist was in the office.  The patient presents with a flat affect and mood is depressed.  The patient reports that she is very upset because of how she is being treated by her daughter [name] and her granddaughter [name].  We talked about how to handle the situation and I recommended that she initiate a conversation with both of them.  We did role playing and I gave her specific words to say to have the conversation.  Her family tends to just avoid any kind of confrontation at all and does not work through any of their issues.  I encouraged her to try to do this because this is a healthy way of interacting.  The patient was catastrophizing during this session and saying that she should have never moved to [place] and I had to remind her that it has  not been all that and that we may have to work on thinking a different way.  The patient also was reminded that she sometimes gets more depressed during the fall and I encouraged her to keep doing the things that she enjoys doing and to try to initiate the conversation with her daughter and granddaughter.    Interventions: Cognitive Behavioral Therapy and Assertiveness/Communication  Diagnosis:Current mild episode of major depressive disorder, unspecified whether recurrent (HCC)  Plan: Treatment Plan  Strengths/Abilities:  Intelligent, insightful  Treatment Preferences:  Outpatient Individual therapy  Statement of Needs:  "I need a therapist to help me with my depression"  Symptoms:  decreased motivation: (Status: improved). poor concentration: (Status: improved). poor sleep: (Status: improved).  Problems Addressed:  Unipolar depression  Goals: 1. New Goal Statement for Unipolar Depression  Describe current and past experiences with depression including their impact on functioning and  attempts to resolve it. Target Date: 2023-09-21 Frequency: bi weekly Progress: 60 Modality: individual  2.Identify and replace thoughts and beliefs that support depression. Target Date: 2023-09-21 Frequency: bi weekly Progress: 70 Modality: individual  3.  Learn and implement behavioral strategies to overcome depression. Target Date: 2023-09-21 Frequency: bi weekly Progress: 70 Modality: individual  4.  Learn and implement problem-solving and decision-making skills. Target Date: 2023-09-21 Frequency: bi weekly Progress: 90 Modality: individual   Interventions by Therapist:  CBT , insight oriented approach and problem solving therapy.  Treatment plan created with patient and  patient approves plan.  Yuko [employer]entry G Cinsere Mizrahi,  LCSW                                                                                                                   Ashantee Deupree G Jaelon Gatley, LCSW

## 2023-04-01 DIAGNOSIS — Z23 Encounter for immunization: Secondary | ICD-10-CM | POA: Diagnosis not present

## 2023-04-03 ENCOUNTER — Emergency Department (HOSPITAL_COMMUNITY): Payer: Medicare Other

## 2023-04-03 ENCOUNTER — Encounter (HOSPITAL_COMMUNITY): Payer: Self-pay

## 2023-04-03 ENCOUNTER — Emergency Department (HOSPITAL_COMMUNITY)
Admission: EM | Admit: 2023-04-03 | Discharge: 2023-04-03 | Disposition: A | Discharge status: Home / Self Care | Payer: Medicare Other | Attending: Emergency Medicine | Admitting: Emergency Medicine

## 2023-04-03 ENCOUNTER — Other Ambulatory Visit: Payer: Self-pay

## 2023-04-03 DIAGNOSIS — S61412A Laceration without foreign body of left hand, initial encounter: Secondary | ICD-10-CM | POA: Insufficient documentation

## 2023-04-03 DIAGNOSIS — G20C Parkinsonism, unspecified: Secondary | ICD-10-CM | POA: Diagnosis not present

## 2023-04-03 DIAGNOSIS — Z91040 Latex allergy status: Secondary | ICD-10-CM | POA: Diagnosis not present

## 2023-04-03 DIAGNOSIS — Z23 Encounter for immunization: Secondary | ICD-10-CM | POA: Diagnosis not present

## 2023-04-03 DIAGNOSIS — S61452A Open bite of left hand, initial encounter: Secondary | ICD-10-CM | POA: Diagnosis not present

## 2023-04-03 DIAGNOSIS — W540XXA Bitten by dog, initial encounter: Secondary | ICD-10-CM | POA: Insufficient documentation

## 2023-04-03 DIAGNOSIS — S66902A Unspecified injury of unspecified muscle, fascia and tendon at wrist and hand level, left hand, initial encounter: Secondary | ICD-10-CM | POA: Diagnosis not present

## 2023-04-03 DIAGNOSIS — M85842 Other specified disorders of bone density and structure, left hand: Secondary | ICD-10-CM | POA: Diagnosis not present

## 2023-04-03 MED ORDER — CEFUROXIME AXETIL 500 MG PO TABS: 500.0000 mg | ORAL_TABLET | Freq: Two times a day (BID) | ORAL | 0 refills | Status: AC

## 2023-04-03 MED ORDER — CIPROFLOXACIN HCL 500 MG PO TABS: 500.0000 mg | ORAL_TABLET | Freq: Two times a day (BID) | ORAL | 0 refills | Status: DC

## 2023-04-03 MED ORDER — CLINDAMYCIN HCL 300 MG PO CAPS
300.0000 mg | ORAL_CAPSULE | Freq: Three times a day (TID) | ORAL | 0 refills | Status: DC
Start: 2023-04-03 — End: 2023-04-03

## 2023-04-03 MED ORDER — TETANUS-DIPHTH-ACELL PERTUSSIS 5-2.5-18.5 LF-MCG/0.5 IM SUSY
0.5000 mL | PREFILLED_SYRINGE | Freq: Once | INTRAMUSCULAR | Status: AC
  Administered 2023-04-03: 0.5 mL via INTRAMUSCULAR
  Filled 2023-04-03: qty 0.5

## 2023-04-03 MED ORDER — METRONIDAZOLE 500 MG PO TABS: 500.0000 mg | ORAL_TABLET | Freq: Two times a day (BID) | ORAL | 0 refills | Status: AC

## 2023-04-03 NOTE — ED Notes (Signed)
Left hand cleaned and dressing applied.

## 2023-04-03 NOTE — ED Triage Notes (Signed)
Pt states she was at a neighbor's home and the neighbor's dog jumped up and bit her left hand. Pt states the neighbor said the dog was up to date with all the vaccines. Pt has a very small laceration to left posterior hand. The area is erythematous.

## 2023-04-03 NOTE — ED Provider Notes (Signed)
[facility] EMERGENC[facility] DEPARTMENT AT [facility] Provider Note   CSN: [id number] Arrival date & time: 04/03/23  1213     History  No chief complaint on file.   Sylvia Maldonado is a 82 y.o. female history of hyperlipidemia, parkinsonism presents today for evaluation of dog bite.  Patient states she was at a neighbor's home and the neighbors dog jumped up and bit her left hand.  Patient states the neighbors that the dog was up-to-date with other vaccines.  Patient has a small laceration to her hand.  The area is swollen and erythematous.  She denies any fever.  HPI  Past Medical History:  Diagnosis Date   Cataract    Depression    Diverticulosis    Dry eyes    Hx of adenomatous colonic polyps    Hyperlipidemia    Internal hemorrhoids    Parkinsonism 07/19/2018   On Abilify   Past Surgical History:  Procedure Laterality Date   cataracts surg     COLONOSCOPY     EYE SURGERY     PELVIC FLOOR REPAIR     TONSILLECTOMY     VAGINAL HYSTERECTOMY       Home Medications Prior to Admission medications   Medication Sig Start Date End Date Taking? Authorizing Provider  ARIPiprazole (ABILIFY) 2 MG tablet 1  qam 03/15/23   Plovsky, Earvin Hansen, MD  Cholecalciferol (VITAMIN D-3) 1000 UNITS CAPS Take 2,000 Units by mouth daily.    [provider]  citalopram (CELEXA) 20 MG tablet Take 1 tablet (20 mg total) by mouth daily. 03/15/23   Plovsky, Earvin Hansen, MD  cycloSPORINE (RESTASIS) 0.05 % ophthalmic emulsion Place 1 drop into both eyes daily.     [provider]  gabapentin (NEURONTIN) 100 MG capsule Take 100 mg by mouth at bedtime. 09/18/21   [provider]  loteprednol (LOTEMAX) 0.5 % ophthalmic suspension 1 drop into affected eye    [provider]  Magnesium 200 MG TABS 1 tablet with a meal    [provider]  pantoprazole (PROTONIX) 40 MG tablet Take 40 mg by mouth daily.    [provider]  [facility] injection  06/11/20    [provider]  Turmeric 500 MG CAPS 1 capsule    [provider]  vitamin B-12 (CYANOCOBALAMIN) 1000 MCG tablet Take 1,000 mcg by mouth daily.    [provider]      Allergies    Doxycycline, Neosporin [neomycin-bacitracin zn-polymyx], Penicillins, Sulfa antibiotics, Latex, Prednisone, Acetaminophen, Amprenavir, Aripiprazole, Bupropion, Darunavir, Egg-derived products, Sulfur, Venlafaxine, and Zoster vac recomb adjuvanted    Review of Systems   Review of Systems Negative except as per HPI.  Physical Exam Updated Vital Signs BP (!) 163/66 (BP Location: Right Arm)   Pulse 67   Temp 98.7 F (37.1 C) (Oral)   Resp 16   Ht 5\' 5"  (1.651 m)   Wt 54.4 kg   SpO2 93%   BMI 19.96 kg/m  Physical Exam Vitals and nursing note reviewed.  Constitutional:      Appearance: Normal appearance.  HENT:     Head: Normocephalic and atraumatic.     Mouth/Throat:     Mouth: Mucous membranes are moist.  Eyes:     General: No scleral icterus. Cardiovascular:     Rate and Rhythm: Normal rate and regular rhythm.     Pulses: Normal pulses.     Heart sounds: Normal heart sounds.  Pulmonary:     Effort: Pulmonary  effort is normal.     Breath sounds: Normal breath sounds.  Abdominal:     General: Abdomen is flat.     Palpations: Abdomen is soft.     Tenderness: There is no abdominal tenderness.  Musculoskeletal:        General: No deformity.     Comments: 0.5 cm laceration to the dorsal aspect of the L hand.  Skin:    General: Skin is warm.     Findings: No rash.  Neurological:     General: No focal deficit present.     Mental Status: She is alert.  Psychiatric:        Mood and Affect: Mood normal.     ED Results / Procedures / Treatments   Labs (all labs ordered are listed, but only abnormal results are displayed) Labs Reviewed - No data to display  EKG None  Radiology No results found.  Procedures Procedures    Medications Ordered in  ED Medications - No data to display  ED Course/ Medical Decision Making/ A&P                                 Medical Decision Making Amount and/or Complexity of Data Reviewed Radiology: ordered.  Risk Prescription drug management.   This patient presents to the ED for dog bite, this involves an extensive number of treatment options, and is a complaint that carries with a high risk of complications and morbidity.  The differential diagnosis includes foreign body, laceration, cellulitis, infectious etiology.  This is not an exhaustive list.  Imaging studies: I ordered imaging studies, personally reviewed, interpreted imaging and agree with the radiologist's interpretations. The results include: X-ray of left hand showed soft tissue swelling to the dorsal aspect of the left hand with no foreign body.  Problem list/ ED course/ Critical interventions/ Medical management: HPI: See above Vital signs within normal range and stable throughout visit. Laboratory/imaging studies significant for: See above. On physical examination, patient is afebrile and appears in no acute distress.  There is a 0.5 cm laceration to the dorsal aspect of the left hand with surrounding skin erythema.  Bleeding is controlled. Will leave wound open for secondary healing.  Per pharmacy consult will give patient cefuroxime and metronidazole for dog bite as patient is allergic to multiple medications.  X-ray of the left hand showed no acute osseous abnormality.  No evidence of foreign body.  Tetanus shot updated. Advised patient to take Tylenol for pain, take her antibiotics as prescribed, follow-up with her PCP for reevaluation and management.  Strict ED return precaution discussed. I have reviewed the patient home medicines and have made adjustments as needed.  Cardiac monitoring/EKG: The patient was maintained on a cardiac monitor.  I personally reviewed and interpreted the cardiac monitor which showed an underlying  rhythm of: sinus rhythm.  Additional history obtained: External records from outside source obtained and reviewed including: Chart review including previous notes, labs, imaging.  Consultations obtained:  Disposition Continued outpatient therapy. Follow-up with PCP recommended for reevaluation of symptoms. Treatment plan discussed with patient.  Pt acknowledged understanding was agreeable to the plan. Worrisome signs and symptoms were discussed with patient, and patient acknowledged understanding to return to the ED if they noticed these signs and symptoms. Patient was stable upon discharge.   This chart was dictated using voice recognition software.  Despite best efforts to proofread,  errors can occur which can change  the documentation meaning.          Final Clinical Impression(s) / ED Diagnoses Final diagnoses:  Dog bite, initial encounter    Rx / DC Orders ED Discharge Orders          Ordered    clindamycin (CLEOCIN) 300 MG capsule  3 times daily,   Status:  Discontinued        04/03/23 1412    ciprofloxacin (CIPRO) 500 MG tablet  Every 12 hours,   Status:  Discontinued        04/03/23 1412    cefUROXime (CEFTIN) 500 MG tablet  2 times daily with meals        04/03/23 1431    metroNIDAZOLE (FLAGYL) 500 MG tablet  2 times daily        04/03/23 1431              Jeanelle Malling, [place] 04/03/23 1441    Loetta Rough, MD 04/06/23 1139

## 2023-04-03 NOTE — Discharge Instructions (Addendum)
Please take tylenol for pain. Take your antibiotics as prescribed. If you develop allergic reactions please discontinue medication and contact your primary care physician for a medication change.  Replace the dressing daily or when it becomes wet or dirty and reapply antibiotic ointment if needed. I recommend close follow-up with PCP for reevaluation.  Please do not hesitate to return to emergency department if worrisome signs symptoms we discussed become apparent.

## 2023-04-04 DIAGNOSIS — Z6820 Body mass index (BMI) 20.0-20.9, adult: Secondary | ICD-10-CM | POA: Diagnosis not present

## 2023-04-04 DIAGNOSIS — W540XXA Bitten by dog, initial encounter: Secondary | ICD-10-CM | POA: Diagnosis not present

## 2023-04-04 DIAGNOSIS — M79642 Pain in left hand: Secondary | ICD-10-CM | POA: Diagnosis not present

## 2023-04-11 DIAGNOSIS — R21 Rash and other nonspecific skin eruption: Secondary | ICD-10-CM | POA: Diagnosis not present

## 2023-04-11 DIAGNOSIS — K148 Other diseases of tongue: Secondary | ICD-10-CM | POA: Diagnosis not present

## 2023-04-11 DIAGNOSIS — Z6820 Body mass index (BMI) 20.0-20.9, adult: Secondary | ICD-10-CM | POA: Diagnosis not present

## 2023-04-11 DIAGNOSIS — E538 Deficiency of other specified B group vitamins: Secondary | ICD-10-CM | POA: Diagnosis not present

## 2023-04-11 DIAGNOSIS — R6889 Other general symptoms and signs: Secondary | ICD-10-CM | POA: Diagnosis not present

## 2023-04-13 ENCOUNTER — Ambulatory Visit (HOSPITAL_BASED_OUTPATIENT_CLINIC_OR_DEPARTMENT_OTHER): Payer: Medicare Other | Admitting: Psychiatry

## 2023-04-13 ENCOUNTER — Encounter (HOSPITAL_COMMUNITY): Payer: Self-pay | Admitting: Psychiatry

## 2023-04-13 VITALS — BP 132/71 | HR 64 | Ht 65.0 in | Wt 122.0 lb

## 2023-04-13 DIAGNOSIS — F325 Major depressive disorder, single episode, in full remission: Secondary | ICD-10-CM

## 2023-04-13 NOTE — Progress Notes (Signed)
Patient ID: Sylvia Maldonado, female   DOB: 31-Oct-1940, 82 y.o.   MRN: [id number] [facility] MD Progress Note  04/13/2023 4:18 PM Sylvia Maldonado  MRN:  [id number] Subjective: I am feel awful; I feel depressed. Principal Problem: Major depression in remission Diagnosis: Major depression recurrent  Today the patient was seen in the office.  I asked her to come in earlier as she called the office and said that she felt some bumps in her mouth and had a headache.  Today's pain scale demonstrates minimal movements in her hands but that does show some truncal torso movements.  They are very minimal the patient barely notices them.  She really does not have any other complaints other than bumps in her mouth a headache.  The patient wears a mouthguard at night.  She showed minimal to no real oral movements on exam.  I think some of her movements are slightly worse since she has been off the Abilify for about 2 weeks.  I shared with her that this is probably minimal withdrawal from the Abilify.  Again we reviewed why she is coming off the Abilify as I shared with her that she is showing signs of tardive dyskinesia and the best thing to do is to start by getting rid of it.  I shared with her that I expect within the next month or 2 that almost all of it will be gone.  I do expect that will be the case.  I do not think she is a candidate for Ingrezza for any other medication for tardive dyskinesia.  Her symptoms are very minimal.  I do not think he has a good reason to go back on the Abilify.  Her mood is stable.  She continues her antidepressant as prescribed.  She continues in therapy.  I cannot explain why she has a headache.  She recently saw her primary care doctor had some blood drawn and the labs are still pending.  The patient will keep her appointment in November.  The patient appears calm no distress.  She is at her normal mood where she is friendly and engageable.             Past Surgical  History:  Procedure Laterality Date   cataracts surg     COLONOSCOPY     EYE SURGERY     PELVIC FLOOR REPAIR     TONSILLECTOMY     VAGINAL HYSTERECTOMY     Family History:  Family History  Problem Relation Age of Onset   CAD Neg Hx    Family Psychiatric  History:  Social History:  Social History   Substance and Sexual Activity  Alcohol Use No     Social History   Substance and Sexual Activity  Drug Use No    Social History   Socioeconomic History   Marital status: Single    Spouse name: Not on file   Number of children: Not on file   Years of education: Not on file   Highest education level: Not on file  Occupational History   Not on file  Tobacco Use   Smoking status: Never   Smokeless tobacco: Never  Vaping Use   Vaping status: Never Used  Substance and Sexual Activity   Alcohol use: No   Drug use: No   Sexual activity: Not Currently  Other Topics Concern   Not on file  Social History Narrative   Right handed    Lives alone at home  No caffeine usage    Social Determinants of Corporate investment banker Strain: Not on file  Food Insecurity: Not on file  Transportation Needs: Not on file  Physical Activity: Not on file  Stress: Not on file  Social Connections: Not on file   Additional Social History:                         Sleep: Good  Appetite:  Good  Current Medications: Current Outpatient Medications  Medication Sig Dispense Refill   ARIPiprazole (ABILIFY) 2 MG tablet 1  qam 14 tablet 0   Cholecalciferol (VITAMIN D-3) 1000 UNITS CAPS Take 2,000 Units by mouth daily.     citalopram (CELEXA) 20 MG tablet Take 1 tablet (20 mg total) by mouth daily. 90 tablet 4   cycloSPORINE (RESTASIS) 0.05 % ophthalmic emulsion Place 1 drop into both eyes daily.      Magnesium 200 MG TABS 1 tablet with a meal     SHINGRIX injection      vitamin B-12 (CYANOCOBALAMIN) 1000 MCG tablet Take 1,000 mcg by mouth daily.     gabapentin (NEURONTIN)  100 MG capsule Take 100 mg by mouth at bedtime.     loteprednol (LOTEMAX) 0.5 % ophthalmic suspension 1 drop into affected eye     pantoprazole (PROTONIX) 40 MG tablet Take 40 mg by mouth daily.     Turmeric 500 MG CAPS 1 capsule     No current facility-administered medications for this visit.    Lab Results: No results found for this or any previous visit (from the past 48 hour(s)).  Physical Findings: AIMS:  , ,  ,  ,    CIWA:    COWS:     Musculoskeletal: Strength & Muscle Tone: within normal limits Gait & Station: normal Patient leans: Right  Psychiatric Specialty Exam: ROS  Blood pressure 132/71, pulse 64, height 5\' 5"  (1.651 m), weight 122 lb (55.3 kg).Body mass index is 20.3 kg/m.  General Appearance: Casual  Eye Contact::  Good  Speech:  Clear and Coherent  Volume:  Normal  Mood:  NA  Affect:  Congruent  Thought Process:  Coherent  Orientation:  Full (Time, Place, and Person)  Thought Content:  WDL  Suicidal Thoughts:  No  Homicidal Thoughts:  No  Memory:  NA  Judgement:  NA  Insight:  Good  Psychomotor Activity:  Normal  Concentration:  Good  Recall:  Good  Fund of Knowledge:Good  Language: Good  Akathisia:  No  Handed:  Right  AIMS (if indicated):     Assets:  Desire for Improvement  ADL's:  Intact  Cognition: WNL  Sleep:       Sylvia Maldonado 04/13/2023, 4:18 PM   Today the patient feels pretty well.  She does have some bumps that she complains of in her mouth.  She has minimal movements in her body.  They are aims scale was not much different than it was on her last visit.  I do think the possibility of some mild withdrawal from discontinued Abilify might be present.  Nonetheless I assured her that in the next month or 2 that would likely not need to be detectable.  Her movements are very minimal at this time.  She still has some labs to come back from her primary care doctor's evaluation recently.  When examined the patient showed no tongue  movements.  She showed no oral changes/movements.  This patient will keep  her next appointment in November.. I connected with Alese Furniss Mccay on 04/13/23 at  4:00 PM EDT by telephone and verified that I am speaking with the correct person using two identifiers.  Location: Patient: home Provider: office   I discussed the limitations, risks, security and privacy concerns of performing an evaluation and management service by telephone and the availability of in person appointments. I also discussed with the patient that there may be a patient responsible charge related to this service. The patient expressed understanding and agreed to proceed.      I discussed the assessment and treatment plan with the patient. The patient was provided an opportunity to ask questions and all were answered. The patient agreed with the plan and demonstrated an understanding of the instructions.   The patient was advised to call back or seek an in-person evaluation if the symptoms worsen or if the condition fails to improve as anticipated.  I provided 30 minutes of non-face-to-face time during this encounter.   Sylvia Balsam, MD

## 2023-04-14 ENCOUNTER — Ambulatory Visit: Payer: Medicare Other | Admitting: Psychology

## 2023-04-14 DIAGNOSIS — L299 Pruritus, unspecified: Secondary | ICD-10-CM | POA: Diagnosis not present

## 2023-04-14 DIAGNOSIS — Z6821 Body mass index (BMI) 21.0-21.9, adult: Secondary | ICD-10-CM | POA: Diagnosis not present

## 2023-04-14 DIAGNOSIS — Z03818 Encounter for observation for suspected exposure to other biological agents ruled out: Secondary | ICD-10-CM | POA: Diagnosis not present

## 2023-04-14 DIAGNOSIS — J069 Acute upper respiratory infection, unspecified: Secondary | ICD-10-CM | POA: Diagnosis not present

## 2023-04-14 DIAGNOSIS — F320 Major depressive disorder, single episode, mild: Secondary | ICD-10-CM

## 2023-04-14 DIAGNOSIS — R519 Headache, unspecified: Secondary | ICD-10-CM | POA: Diagnosis not present

## 2023-04-14 DIAGNOSIS — R6883 Chills (without fever): Secondary | ICD-10-CM | POA: Diagnosis not present

## 2023-04-14 NOTE — Progress Notes (Addendum)
[facility] Counselor/Therapist Progress Note  Patient ID: Sylvia Maldonado, MRN: [id number],    Date: 04/14/2023  Time Spent: 52 minutes  Time in: 2:00  Time out:  2:52  Treatment Type: Individual Therapy  Reported Symptoms: depression  Mental Status Exam: Appearance:  Casual     Behavior: Appropriate  Motor: Normal  Speech/Language:  Normal Rate  Affect: Appropriate  Mood: normal  Thought process: normal  Thought content:   WNL  Sensory/Perceptual disturbances:   WNL  Orientation: oriented to person, place, time/date, and situation  Attention: Good  Concentration: Good  Memory: WNL  Fund of knowledge:  Good  Insight:   Good  Judgment:  Good  Impulse Control: Good   Risk Assessment: Danger to Self:  No Self-injurious Behavior: No Danger to Others: No Duty to Warn:no Physical Aggression / Violence:No  Access to Firearms a concern: No  Gang Involvement:No   Subjective: The patient attended an individual therapy session via video visit.  The patient gave verbal consent for the session to be the on video on caregility and is aware of the limitations of telehealth.  The patient was in her home alone and the therapist was in the office.  The patient presents with a flat affect and mood is depressed.  The patient reports that she is very upset because of how she is being treated by her daughter [name] and her granddaughter [name].  We talked about how to handle the situation and I recommended that she initiate a conversation with both of them.  We did role playing and I gave her specific words to say to have the conversation.  Her family tends to just avoid any kind of confrontation at all and does not work through any of their issues.  I encouraged her to try to do this because this is a healthy way of interacting.  The patient was catastrophizing during this session and saying that she should have never moved to [place] and I  had to remind her that it has not been all that and that we may have to work on thinking a different way.  The patient also was reminded that she sometimes gets more depressed during the fall and I encouraged her to keep doing the things that she enjoys doing and to try to initiate the conversation with her daughter and granddaughter.    Interventions: Cognitive Behavioral Therapy and Assertiveness/Communication  Diagnosis:Current mild episode of major depressive disorder, unspecified whether recurrent (HCC)  Plan: Treatment Plan  Strengths/Abilities:  Intelligent, insightful  Treatment Preferences:  Outpatient Individual therapy  Statement of Needs:  "I need a therapist to help me with my depression"  Symptoms:  decreased motivation: (Status: improved). poor concentration: (Status: improved). poor sleep: (Status: improved).  Problems Addressed:  Unipolar depression  Goals: 1. New Goal Statement for Unipolar Depression  Describe current and past experiences with depression including their impact on functioning and  attempts to resolve it. Target Date: 2023-09-21 Frequency: bi weekly Progress: 60 Modality: individual  2.Identify and replace thoughts and beliefs that support depression. Target Date: 2023-09-21 Frequency: bi weekly Progress: 70 Modality: individual  3.  Learn and implement behavioral strategies to overcome depression. Target Date: 2023-09-21 Frequency: bi weekly Progress: 70 Modality: individual  4.  Learn and implement problem-solving and decision-making skills. Target Date: 2023-09-21 Frequency: bi weekly Progress: 90 Modality: individual   Interventions by Therapist:  CBT ,  insight oriented approach and problem solving therapy.  Treatment plan created with patient and patient approves plan.  [name],  LCSW                                                                               [facility] Counselor/Therapist Progress Note  Patient ID: Sylvia Maldonado, MRN: [id number],    Date: 04/14/2023  Time Spent: 49 minutes  Time in: 2:00  Time out:  2:49  Treatment Type: Individual Therapy  Reported Symptoms: depression  Mental Status Exam: Appearance:  Casual     Behavior: Appropriate  Motor: Normal  Speech/Language:  Normal Rate  Affect: Appropriate  Mood: anxious  Thought process: normal  Thought content:   WNL  Sensory/Perceptual disturbances:   WNL  Orientation: oriented to person, place, time/date, and situation  Attention: Good  Concentration: Good  Memory: WNL  Fund of knowledge:  Good  Insight:   Good  Judgment:  Good  Impulse Control: Good   Risk Assessment: Danger to Self:  No Self-injurious Behavior: No Danger to Others: No Duty to Warn:no Physical Aggression / Violence:No  Access to Firearms a concern: No  Gang Involvement:No   Subjective: The patient attended an individual therapy session via video visit.  The patient gave verbal consent for the session to be the on video on caregility and is aware of the limitations of telehealth.  The patient was in her home alone and the therapist was in the office.  The patient presents with a flat affect and mood is anxious.  The patient states that it has been a bad week.  She reports that she has been having headaches ,feeling very  fatigued, and that she has sores in her mouth.  She states that she went to the doctor and the doctor is running some tests and also they did a COVID test on her.  The patient also is clenching her jaw pretty significantly.  I recommended that she wait until she hear back from the doctor about what the test show and then I suggested maybe that she contact Dr. Donell Beers and make sure he is aware that she is  clenching her jaw as he did take her off of Abilify  in the recent past.  The patient seems to have done everything she can do to figure out how to solve this problem and I did some reassuring her that I thought she might be still be able to go to her granddaughter's wedding in [place] next week if she goes ahead and takes care of these issues.  The patient was very sad that she might miss it.  I encouraged her to stay as positive as possible and it seems that her daughter is being more supportive of her as well.  In addition her daughter Marcelle Overlie is in [place] and a tree fell through their house.  I asked her about how stressed she is about that and we talked about her daughter being safe and we will continue to try to provide her as much supportive therapy as possible.  Interventions: Cognitive Behavioral Therapy and Assertiveness/Communication  Diagnosis:Current mild episode of major depressive disorder, unspecified whether recurrent (  HCC)  Plan: Treatment Plan  Strengths/Abilities:  Intelligent, insightful  Treatment Preferences:  Outpatient Individual therapy  Statement of Needs:  "I need a therapist to help me with my depression"  Symptoms:  decreased motivation: (Status: improved). poor concentration: (Status: improved). poor sleep: (Status: improved).  Problems Addressed:  Unipolar depression  Goals: 1. New Goal Statement for Unipolar Depression  Describe current and past experiences with depression including their impact on functioning and  attempts to resolve it. Target Date: 2023-09-21 Frequency: bi weekly Progress: 60 Modality: individual  2.Identify and replace thoughts and beliefs that support depression. Target Date: 2023-09-21 Frequency: bi weekly Progress: 70 Modality: individual  3.  Learn and implement behavioral strategies to overcome depression. Target Date: 2023-09-21 Frequency: bi weekly Progress: 70 Modality: individual  4.  Learn and implement  problem-solving and decision-making skills. Target Date: 2023-09-21 Frequency: bi weekly Progress: 90 Modality: individual   Interventions by Therapist:  CBT , insight oriented approach and problem solving therapy.  Treatment plan created with patient and patient approves plan.  Standley Bargo G Samiha Denapoli, LCSW

## 2023-04-15 DIAGNOSIS — B370 Candidal stomatitis: Secondary | ICD-10-CM | POA: Diagnosis not present

## 2023-04-15 DIAGNOSIS — G4452 New daily persistent headache (NDPH): Secondary | ICD-10-CM | POA: Diagnosis not present

## 2023-04-18 DIAGNOSIS — B349 Viral infection, unspecified: Secondary | ICD-10-CM | POA: Diagnosis not present

## 2023-04-18 DIAGNOSIS — Z6821 Body mass index (BMI) 21.0-21.9, adult: Secondary | ICD-10-CM | POA: Diagnosis not present

## 2023-04-25 DIAGNOSIS — R6883 Chills (without fever): Secondary | ICD-10-CM | POA: Diagnosis not present

## 2023-04-25 DIAGNOSIS — Z6821 Body mass index (BMI) 21.0-21.9, adult: Secondary | ICD-10-CM | POA: Diagnosis not present

## 2023-04-25 DIAGNOSIS — G44209 Tension-type headache, unspecified, not intractable: Secondary | ICD-10-CM | POA: Diagnosis not present

## 2023-04-25 DIAGNOSIS — L299 Pruritus, unspecified: Secondary | ICD-10-CM | POA: Diagnosis not present

## 2023-04-26 ENCOUNTER — Ambulatory Visit (HOSPITAL_BASED_OUTPATIENT_CLINIC_OR_DEPARTMENT_OTHER): Payer: Medicare Other | Admitting: Psychiatry

## 2023-04-26 ENCOUNTER — Other Ambulatory Visit: Payer: Self-pay | Admitting: Family Medicine

## 2023-04-26 ENCOUNTER — Other Ambulatory Visit: Payer: Self-pay

## 2023-04-26 ENCOUNTER — Encounter (HOSPITAL_COMMUNITY): Payer: Self-pay | Admitting: Psychiatry

## 2023-04-26 VITALS — BP 127/60 | HR 58 | Ht 65.0 in | Wt 122.0 lb

## 2023-04-26 DIAGNOSIS — G44209 Tension-type headache, unspecified, not intractable: Secondary | ICD-10-CM

## 2023-04-26 DIAGNOSIS — F329 Major depressive disorder, single episode, unspecified: Secondary | ICD-10-CM | POA: Diagnosis not present

## 2023-04-26 MED ORDER — ARIPIPRAZOLE 10 MG PO TABS: ORAL_TABLET | ORAL | 3 refills | Status: DC

## 2023-04-26 MED ORDER — CITALOPRAM HYDROBROMIDE 20 MG PO TABS: 20.0000 mg | ORAL_TABLET | Freq: Every day | ORAL | 4 refills | Status: DC

## 2023-04-26 MED ORDER — VALBENAZINE TOSYLATE 40 MG PO CAPS: 40.0000 mg | ORAL_CAPSULE | Freq: Every day | ORAL | 2 refills | Status: DC

## 2023-04-26 MED ORDER — VALBENAZINE TOSYLATE 60 MG PO CAPS: 60.0000 mg | ORAL_CAPSULE | Freq: Every day | ORAL | 1 refills | Status: DC

## 2023-04-26 NOTE — Progress Notes (Signed)
Patient ID: Sylvia Maldonado, female   DOB: [birth year], 82 y.o.   MRN: [id number] [facility] MD Progress Note  04/26/2023 3:57 PM Sylvia Maldonado  MRN:  [id number] Subjective: I am feel awful; I feel depressed. Principal Problem: Major depression in remission Diagnosis: Major depression recurrent  Today the patient is seen earlier than scheduled appointment.  She is scheduled in about 6 weeks from now but she has come in early.  The last I saw her was 2 weeks ago.  When I saw her mood was fair.  Today she shares that for the last 2 weeks she has felt horrible.  She does not feel suicidal but she feels persistently depressed persistently irritable unable to sleep and has lost her appetite.  She clearly has having a decline in her reoccurrence of her clinical depression.  Today we will restart her back on the Abilify 10 mg a day that she was on a year ago.  On this dose she has done very well.  Unfortunately she still shows signs of truncal movements related to tardive dyskinesia.  I believe she is showing significant movements of her hands.  Today we will begin her on [place] 40 mg for 2 weeks and then go to 60 mg for the next week.  She will return to see me for her scheduled next appointment in 3 weeks.  At that time I will consider increasing the Ingrezza to the full dose.  The patient will continue taking Celexa 20 mg.  The patient appears to be in significant distress.  My plan is for her to probably continue on Abilify as well as Ingrezza for an extended period of time.             Past Surgical History:  Procedure Laterality Date   cataracts surg     COLONOSCOPY     EYE SURGERY     PELVIC FLOOR REPAIR     TONSILLECTOMY     VAGINAL HYSTERECTOMY     Family History:  Family History  Problem Relation Age of Onset   CAD Neg Hx    Family Psychiatric  History:  Social History:  Social History   Substance and Sexual Activity  Alcohol Use No     Social History   Substance  and Sexual Activity  Drug Use No    Social History   Socioeconomic History   Marital status: Single    Spouse name: Not on file   Number of children: Not on file   Years of education: Not on file   Highest education level: Not on file  Occupational History   Not on file  Tobacco Use   Smoking status: Never   Smokeless tobacco: Never  Vaping Use   Vaping status: Never Used  Substance and Sexual Activity   Alcohol use: No   Drug use: No   Sexual activity: Not Currently  Other Topics Concern   Not on file  Social History Narrative   Right handed    Lives alone at home    No caffeine usage    Social Determinants of Health   Financial Resource Strain: Not on file  Food Insecurity: Not on file  Transportation Needs: Not on file  Physical Activity: Not on file  Stress: Not on file  Social Connections: Not on file   Additional Social History:                         Sleep:  Good  Appetite:  Good  Current Medications: Current Outpatient Medications  Medication Sig Dispense Refill   Cholecalciferol (VITAMIN D-3) 1000 UNITS CAPS Take 2,000 Units by mouth daily.     cycloSPORINE (RESTASIS) 0.05 % ophthalmic emulsion Place 1 drop into both eyes daily.      Magnesium 200 MG TABS 1 tablet with a meal     SHINGRIX injection      vitamin B-12 (CYANOCOBALAMIN) 1000 MCG tablet Take 1,000 mcg by mouth daily.     ARIPiprazole (ABILIFY) 10 MG tablet 1  qam 30 tablet 3   citalopram (CELEXA) 20 MG tablet Take 1 tablet (20 mg total) by mouth daily. 90 tablet 4   gabapentin (NEURONTIN) 100 MG capsule Take 100 mg by mouth at bedtime.     loteprednol (LOTEMAX) 0.5 % ophthalmic suspension 1 drop into affected eye     pantoprazole (PROTONIX) 40 MG tablet Take 40 mg by mouth daily.     Turmeric 500 MG CAPS 1 capsule     valbenazine (INGREZZA) 60 MG capsule Take 1 capsule (60 mg total) by mouth daily. 30 capsule 1   No current facility-administered medications for this visit.     Lab Results: No results found for this or any previous visit (from the past 48 hour(s)).  Physical Findings: AIMS:  , ,  ,  ,    CIWA:    COWS:     Musculoskeletal: Strength & Muscle Tone: within normal limits Gait & Station: normal Patient leans: Right  Psychiatric Specialty Exam: ROS  Blood pressure 127/60, pulse (!) 58, height 5\' 5"  (1.651 m), weight 122 lb (55.3 kg).Body mass index is 20.3 kg/m.  General Appearance: Casual  Eye Contact::  Good  Speech:  Clear and Coherent  Volume:  Normal  Mood:  NA  Affect:  Congruent  Thought Process:  Coherent  Orientation:  Full (Time, Place, and Person)  Thought Content:  WDL  Suicidal Thoughts:  No  Homicidal Thoughts:  No  Memory:  NA  Judgement:  NA  Insight:  Good  Psychomotor Activity:  Normal  Concentration:  Good  Recall:  Good  Fund of Knowledge:Good  Language: Good  Akathisia:  No  Handed:  Right  AIMS (if indicated):     Assets:  Desire for Improvement  ADL's:  Intact  Cognition: WNL  Sleep:       Sylvia Maldonado 04/26/2023, 3:57 PM    This patient has 2 problems.  The first problem is major clinical depression recurrence moderate.  Today we will begin her back on her Abilify 10 mg.  Her second problem is tardive dyskinesia.  The patient will begin on Ingrezza 40 mg for 2 weeks and then go to 60 mg.  She will return to see me in 3 weeks.  The possibility of increasing in her Ingrezza to 80 mg will be considered at that point.  The patient is in agreement with these plans and understands that Abilify is inducing a state of tardive dyskinesia.  She is actually very pleased with the above plan. I connected with Sylvia Maldonado on 04/26/23 at  3:30 PM EDT by telephone and verified that I am speaking with the correct person using two identifiers.  Location: Patient: home Provider: office   I discussed the limitations, risks, security and privacy concerns of performing an evaluation and management  service by telephone and the availability of in person appointments. I also discussed with the patient that there may be  a patient responsible charge related to this service. The patient expressed understanding and agreed to proceed.      I discussed the assessment and treatment plan with the patient. The patient was provided an opportunity to ask questions and all were answered. The patient agreed with the plan and demonstrated an understanding of the instructions.   The patient was advised to call back or seek an in-person evaluation if the symptoms worsen or if the condition fails to improve as anticipated.  I provided 30 minutes of non-face-to-face time during this encounter.   Sylvia Balsam, MD

## 2023-04-28 ENCOUNTER — Ambulatory Visit
Admission: RE | Admit: 2023-04-28 | Discharge: 2023-04-28 | Disposition: A | Discharge status: Home / Self Care | Payer: Medicare Other | Source: Ambulatory Visit | Attending: Family Medicine | Admitting: Family Medicine

## 2023-04-28 ENCOUNTER — Ambulatory Visit: Payer: Medicare Other | Admitting: Psychology

## 2023-04-28 DIAGNOSIS — R42 Dizziness and giddiness: Secondary | ICD-10-CM | POA: Diagnosis not present

## 2023-04-28 DIAGNOSIS — F329 Major depressive disorder, single episode, unspecified: Secondary | ICD-10-CM

## 2023-04-28 DIAGNOSIS — G44209 Tension-type headache, unspecified, not intractable: Secondary | ICD-10-CM

## 2023-04-28 DIAGNOSIS — R519 Headache, unspecified: Secondary | ICD-10-CM | POA: Diagnosis not present

## 2023-04-28 NOTE — Progress Notes (Addendum)
[facility] Counselor/Therapist Progress Note  Patient ID: Sylvia Maldonado, MRN: [id number],    Date: 04/14/2023  Time Spent: 52 minutes  Time in: 2:00  Time out:  2:52  Treatment Type: Individual Therapy  Reported Symptoms: depression  Mental Status Exam: Appearance:  Casual     Behavior: Appropriate  Motor: Normal  Speech/Language:  Normal Rate  Affect: Appropriate  Mood: normal  Thought process: normal  Thought content:   WNL  Sensory/Perceptual disturbances:   WNL  Orientation: oriented to person, place, time/date, and situation  Attention: Good  Concentration: Good  Memory: WNL  Fund of knowledge:  Good  Insight:   Good  Judgment:  Good  Impulse Control: Good   Risk Assessment: Danger to Self:  No Self-injurious Behavior: No Danger to Others: No Duty to Warn:no Physical Aggression / Violence:No  Access to Firearms a concern: No  Gang Involvement:No   Subjective: The patient attended an individual therapy session via video visit.  The patient gave verbal consent for the session to be the on video on caregility and is aware of the limitations of telehealth.  The patient was in her home alone and the therapist was in the office.  The patient presents with a flat affect and mood is depressed.  The patient reports that she is very upset because of how she is being treated by her daughter [name] and her granddaughter [name].  We talked about how to handle the situation and I recommended that she initiate a conversation with both of them.  We did role playing and I gave her specific words to say to have the conversation.  Her family tends to just avoid any kind of confrontation at all and does not work through any of their issues.  I encouraged her to try to do this because this is a healthy way of interacting.  The patient was catastrophizing during this session and saying that she should have never moved to [place] and I  had to remind her that it has not been all that and that we may have to work on thinking a different way.  The patient also was reminded that she sometimes gets more depressed during the fall and I encouraged her to keep doing the things that she enjoys doing and to try to initiate the conversation with her daughter and granddaughter.    Interventions: Cognitive Behavioral Therapy and Assertiveness/Communication  Diagnosis:Major depression, chronic  Plan: Treatment Plan  Strengths/Abilities:  Intelligent, insightful  Treatment Preferences:  Outpatient Individual therapy  Statement of Needs:  "I need a therapist to help me with my depression"  Symptoms:  decreased motivation: (Status: improved). poor concentration: (Status: improved). poor sleep: (Status: improved).  Problems Addressed:  Unipolar depression  Goals: 1. New Goal Statement for Unipolar Depression  Describe current and past experiences with depression including their impact on functioning and  attempts to resolve it. Target Date: 2023-09-21 Frequency: bi weekly Progress: 60 Modality: individual  2.Identify and replace thoughts and beliefs that support depression. Target Date: 2023-09-21 Frequency: bi weekly Progress: 70 Modality: individual  3.  Learn and implement behavioral strategies to overcome depression. Target Date: 2023-09-21 Frequency: bi weekly Progress: 70 Modality: individual  4.  Learn and implement problem-solving and decision-making skills. Target Date: 2023-09-21 Frequency: bi weekly Progress: 90 Modality: individual   Interventions by Therapist:  CBT , insight oriented approach and problem solving therapy.  Treatment plan created with patient and patient approves plan.  [name], LCSW                                             [facility] Counselor/Therapist Progress Note  Patient ID: Sylvia Maldonado, MRN: [id number],    Date:  04/28/2023  Time Spent: 49 minutes  Time in: 2:00  Time out:  2:49  Treatment Type: Individual Therapy  Reported Symptoms: depression  Mental Status Exam: Appearance:  Casual     Behavior: Appropriate  Motor: Normal  Speech/Language:  Normal Rate  Affect: Appropriate  Mood: anxious  Thought process: normal  Thought content:   WNL  Sensory/Perceptual disturbances:   WNL  Orientation: oriented to person, place, time/date, and situation  Attention: Good  Concentration: Good  Memory: WNL  Fund of knowledge:  Good  Insight:   Good  Judgment:  Good  Impulse Control: Good   Risk Assessment: Danger to Self:  No Self-injurious Behavior: No Danger to Others: No Duty to Warn:no Physical Aggression / Violence:No  Access to Firearms a concern: No  Gang Involvement:No   Subjective: The patient attended an individual therapy session via video visit.  The patient gave verbal consent for the session to be the on video on caregility and is aware of the limitations of telehealth.  The patient was in her home alone and the therapist was in the office.  The patient presents with a flat affect and mood is anxious.  The patient says that she feels horrible and no one knows what to do to help her.  She states that she does have a brain scan this afternoon.  She continues to feel tired, have sores in her mouth, has a headache etc.  We talked about her following up with her Dr. .  The patient seems to be focused on everything that is negative.  Provided supportive therapy and problem solving.   Interventions: Cognitive Behavioral Therapy and Assertiveness/Communication  Diagnosis:Major depression, chronic  Plan: Treatment Plan  Strengths/Abilities:  Intelligent, insightful  Treatment Preferences:  Outpatient Individual therapy  Statement of Needs:  "I need a therapist to help me with my depression"  Symptoms:  decreased motivation: (Status: improved). poor concentration: (Status: improved).  poor sleep: (Status: improved).  Problems Addressed:  Unipolar depression  Goals: 1. New Goal Statement for Unipolar Depression  Describe current and past experiences with depression including their impact on functioning and  attempts to resolve it. Target Date: 2023-09-21 Frequency: bi weekly Progress: 60 Modality: individual  2.Identify and replace thoughts and beliefs that support depression. Target Date: 2023-09-21 Frequency: bi weekly Progress: 70 Modality: individual  3.  Learn and implement behavioral strategies to overcome depression. Target Date: 2023-09-21 Frequency: bi weekly Progress: 70 Modality: individual  4.  Learn and implement problem-solving and decision-making skills. Target Date: 2023-09-21 Frequency: bi weekly Progress: 90 Modality: individual   Interventions by Therapist:  CBT , insight oriented approach and problem solving therapy.  Treatment plan created with patient and patient approves plan.  Carlyann Placide G Ganesh Deeg, LCSW  [facility] Counselor/Therapist Progress Note  Patient ID: Sylvia Maldonado, MRN: [id number],    Date: 04/14/2023  Time Spent: 52 minutes  Time in: 2:00  Time out:  2:52  Treatment Type: Individual Therapy  Reported Symptoms: depression  Mental Status Exam: Appearance:  Casual     Behavior: Appropriate  Motor: Normal  Speech/Language:  Normal Rate  Affect: Appropriate  Mood: normal  Thought process: normal  Thought content:   WNL  Sensory/Perceptual disturbances:   WNL  Orientation: oriented to person, place, time/date, and situation  Attention: Good  Concentration: Good  Memory: WNL  Fund of knowledge:  Good  Insight:   Good  Judgment:  Good  Impulse  Control: Good   Risk Assessment: Danger to Self:  No Self-injurious Behavior: No Danger to Others: No Duty to Warn:no Physical Aggression / Violence:No  Access to Firearms a concern: No  Gang Involvement:No   Subjective: The patient attended an individual therapy session via video visit.  The patient gave verbal consent for the session to be the on video on caregility and is aware of the limitations of telehealth.  The patient was in her home alone and the therapist was in the office.  The patient presents with a flat affect and mood is depressed.  The patient reports that she is very upset because of how she is being treated by her daughter [name] and her granddaughter [name].  We talked about how to handle the situation and I recommended that she initiate a conversation with both of them.  We did role playing and I gave her specific words to say to have the conversation.  Her family tends to just avoid any kind of confrontation at all and does not work through any of their issues.  I encouraged her to try to do this because this is a healthy way of interacting.  The patient was catastrophizing during this session and saying that she should have never moved to [place] and I had to remind her that it has not been all that and that we may have to work on thinking a different way.  The patient also was reminded that she sometimes gets more depressed during the fall and I encouraged her to keep doing the things that she enjoys doing and to try to initiate the conversation with her daughter and granddaughter.    Interventions: Cognitive Behavioral Therapy and Assertiveness/Communication  Diagnosis:Major depression, chronic  Plan: Treatment Plan  Strengths/Abilities:  Intelligent, insightful  Treatment Preferences:  Outpatient Individual therapy  Statement of Needs:  "I need a therapist to help me with my depression"  Symptoms:  decreased motivation: (Status: improved). poor concentration:  (Status: improved). poor sleep: (Status: improved).  Problems Addressed:  Unipolar depression  Goals: 1. New Goal Statement for Unipolar Depression  Describe current and past experiences with depression including their impact on functioning and  attempts to resolve it. Target Date: 2023-09-21 Frequency: bi weekly Progress: 60 Modality: individual  2.Identify and replace thoughts and beliefs that support depression. Target Date: 2023-09-21 Frequency: bi weekly Progress: 70 Modality: individual  3.  Learn and implement behavioral strategies to overcome depression. Target Date: 2023-09-21 Frequency: bi weekly Progress: 70 Modality: individual  4.  Learn and implement problem-solving and decision-making skills. Target Date: 2023-09-21 Frequency: bi weekly Progress: 90 Modality: individual   Interventions by Therapist:  CBT , insight oriented approach and problem solving therapy.  Treatment plan created with patient and patient approves plan.  Kareli Hossain G Diamonte Stavely, LCSW  [facility] Counselor/Therapist Progress Note  Patient ID: Sylvia Maldonado, MRN: [id number],    Date: 04/14/2023  Time Spent: 47 minutes  Time in: 2:00  Time out:  2:47  Treatment Type: Individual Therapy  Reported Symptoms: depression  Mental Status Exam: Appearance:  Casual     Behavior: Appropriate  Motor: Normal  Speech/Language:  Normal Rate  Affect: Appropriate  Mood: Anxious and depressed  Thought process: normal  Thought content:   WNL  Sensory/Perceptual disturbances:   WNL  Orientation: oriented to person, place, time/date, and situation  Attention: Good  Concentration: Good  Memory: WNL  Fund of knowledge:  Good  Insight:   Good  Judgment:  Good  Impulse Control: Good   Risk Assessment: Danger to Self:  No Self-injurious Behavior: No Danger to Others: No Duty to Warn:no Physical Aggression /  Violence:No  Access to Firearms a concern: No  Gang Involvement:No   Subjective: The patient attended an individual therapy session via video visit.  The patient gave verbal consent for the session to be the on video on caregility and is aware of the limitations of telehealth.  The patient was in her home alone and the therapist was in the office.  The patient presents with a flat affect and mood is anxious and depressed.  The patient reports that she is very frustrated because they cannot figure out what is wrong with her and she feels very very sick.  She describes symptoms of, sores in her mouth, headache, lips sore, scalp sore, and shortness of breath.  We talked about what they are doing to evaluate her and it seems that there taking all the proper precautions.  She says that she went back to see Dr. Donell Beers and he did put her back on Abilify.  She says she does not understand why he took her off of it in the first place and I explained to her that it is not possible that he thought she was having worse tardive dyskinesia.  I provided supportive therapy and problem solving for her today and encouraged her to talk to her doctor about her labs to make sure that she is getting enough iron and she does not have an iron deficiency.  They are doing a brain scan this afternoon at 4:30.  I explained to her that if the brain scan came back okay and her labs were okay then it is likely that her symptoms might be related to him stopping the Abilify.   Interventions: Cognitive Behavioral Therapy and Assertiveness/Communication  Diagnosis:Major depression, chronic  Plan: Treatment Plan  Strengths/Abilities:  Intelligent, insightful  Treatment Preferences:  Outpatient Individual therapy  Statement of Needs:  "I need a therapist to help me with my depression"  Symptoms:  decreased motivation: (Status: improved). poor concentration: (Status: improved). poor sleep: (Status: improved).  Problems Addressed:   Unipolar depression  Goals: 1. New Goal Statement for Unipolar Depression  Describe current and past experiences with depression including their impact on functioning and  attempts to resolve it. Target Date: 2023-09-21 Frequency: bi weekly Progress: 60 Modality: individual  2.Identify and replace thoughts and beliefs that support depression. Target Date: 2023-09-21 Frequency: bi weekly Progress: 70 Modality: individual  3.  Learn and implement behavioral strategies to overcome depression. Target Date: 2023-09-21 Frequency: bi weekly Progress: 70 Modality: individual  4.  Learn and implement problem-solving and decision-making skills. Target Date: 2023-09-21 Frequency: bi weekly Progress: 90 Modality: individual   Interventions by Therapist:  CBT , insight  oriented approach and problem solving therapy.  Treatment plan created with patient and patient approves plan.  Nichola Warren G Savaya Hakes, LCSW

## 2023-04-29 DIAGNOSIS — R42 Dizziness and giddiness: Secondary | ICD-10-CM | POA: Diagnosis not present

## 2023-04-29 DIAGNOSIS — Z6820 Body mass index (BMI) 20.0-20.9, adult: Secondary | ICD-10-CM | POA: Diagnosis not present

## 2023-04-29 DIAGNOSIS — R519 Headache, unspecified: Secondary | ICD-10-CM | POA: Diagnosis not present

## 2023-05-03 ENCOUNTER — Ambulatory Visit (INDEPENDENT_AMBULATORY_CARE_PROVIDER_SITE_OTHER): Payer: Medicare Other | Admitting: Psychology

## 2023-05-03 DIAGNOSIS — F329 Major depressive disorder, single episode, unspecified: Secondary | ICD-10-CM

## 2023-05-03 NOTE — Progress Notes (Unsigned)
                Ellinore Merced G Shelva Hetzer, LCSW

## 2023-05-06 ENCOUNTER — Ambulatory Visit (INDEPENDENT_AMBULATORY_CARE_PROVIDER_SITE_OTHER): Payer: Medicare Other | Admitting: Psychology

## 2023-05-06 DIAGNOSIS — F329 Major depressive disorder, single episode, unspecified: Secondary | ICD-10-CM | POA: Diagnosis not present

## 2023-05-06 NOTE — Progress Notes (Unsigned)
                Diona Peregoy G Denario Bagot, LCSW 

## 2023-05-12 ENCOUNTER — Ambulatory Visit: Payer: Medicare Other | Admitting: Psychology

## 2023-05-17 ENCOUNTER — Encounter: Payer: Self-pay | Admitting: Podiatry

## 2023-05-17 ENCOUNTER — Ambulatory Visit (INDEPENDENT_AMBULATORY_CARE_PROVIDER_SITE_OTHER): Payer: Medicare Other | Admitting: Podiatry

## 2023-05-17 DIAGNOSIS — M79675 Pain in left toe(s): Secondary | ICD-10-CM

## 2023-05-17 DIAGNOSIS — B351 Tinea unguium: Secondary | ICD-10-CM

## 2023-05-17 DIAGNOSIS — M79674 Pain in right toe(s): Secondary | ICD-10-CM

## 2023-05-17 NOTE — Progress Notes (Signed)
This patient returns to the office for evaluation and treatment of long thick painful nails .  This patient is unable to trim her own nails since the patient cannot reach her feet.  Patient says the nails are painful walking and wearing his shoes.  She returns for preventive foot care services.  General Appearance  Alert, conversant and in no acute stress.  Vascular  Dorsalis pedis and posterior tibial  pulses are palpable  bilaterally.  Capillary return is within normal limits  bilaterally. Temperature is within normal limits  bilaterally.  Neurologic  Senn-Weinstein monofilament wire test within normal limits  bilaterally. Muscle power within normal limits bilaterally.  Nails Thick disfigured discolored nails with subungual debris  from hallux to fifth toes bilaterally. No evidence of bacterial infection or drainage bilaterally.  Orthopedic  No limitations of motion  feet .  No crepitus or effusions noted.   HAV 1st MPJ  B/L.  Hammer toes 2-5  B/L. Plantar flexed fifth met  B/L.  Skin  normotropic skin with no porokeratosis noted bilaterally.  No signs of infections or ulcers noted.   Callus subfifth met  right foot.  Onychomycosis  Pain in toes right foot  Pain in toes left foot   Debridement  of nails  1-5  B/L with a nail nipper.  Nails were then filed using a dremel tool with no incidents. RTC 10 weeks    Helane Gunther DPM

## 2023-05-18 ENCOUNTER — Ambulatory Visit (HOSPITAL_BASED_OUTPATIENT_CLINIC_OR_DEPARTMENT_OTHER): Payer: Medicare Other | Admitting: Psychiatry

## 2023-05-18 VITALS — BP 141/74 | HR 60 | Resp 16

## 2023-05-18 DIAGNOSIS — F325 Major depressive disorder, single episode, in full remission: Secondary | ICD-10-CM

## 2023-05-18 MED ORDER — ARIPIPRAZOLE 10 MG PO TABS: ORAL_TABLET | ORAL | 10 refills | Status: DC

## 2023-05-18 MED ORDER — VALBENAZINE TOSYLATE 60 MG PO CAPS: 60.0000 mg | ORAL_CAPSULE | Freq: Every day | ORAL | 5 refills | Status: DC

## 2023-05-18 MED ORDER — CITALOPRAM HYDROBROMIDE 20 MG PO TABS: 20.0000 mg | ORAL_TABLET | Freq: Every day | ORAL | 4 refills | Status: DC

## 2023-05-18 NOTE — Progress Notes (Signed)
Patient ID: Sylvia Maldonado, female   DOB: 04-16-1941, 82 y.o.   MRN: [id number] [facility] MD Progress Note  05/18/2023 2:56 PM Mkenzie Dotts Cibrian  MRN:  [id number] Subjective: I am feel awful; I feel depressed. Principal Problem: Major depression in remission Diagnosis: Major depression recurrent  Today the patient is doing very well.  She shows no signs of tardive dyskinesia.  After 1 week of being back on the Abilify she returned back to normal.  She denies persistent depression.  She walks 4 miles a day.  She goes to the gym.  She is looking forward to Thanksgiving.  She is good to going to [place] to visit her grandchildren.  The patient is positive and optimistic.  She feels good about life.  She is sleeping and eating well.  She has got good energy.  He has a good sense of worth.  She has no problem taking Ingrezza 60 mg together with Abilify 10 mg.  I believe she will stay on this regime perhaps indefinitely.  She continues taking her antidepressant as prescribed.  She is doing extremely well.  She is functioning very well.            Past Surgical History:  Procedure Laterality Date   cataracts surg     COLONOSCOPY     EYE SURGERY     PELVIC FLOOR REPAIR     TONSILLECTOMY     VAGINAL HYSTERECTOMY     Family History:  Family History  Problem Relation Age of Onset   CAD Neg Hx    Family Psychiatric  History:  Social History:  Social History   Substance and Sexual Activity  Alcohol Use No     Social History   Substance and Sexual Activity  Drug Use No    Social History   Socioeconomic History   Marital status: Single    Spouse name: Not on file   Number of children: Not on file   Years of education: Not on file   Highest education level: Not on file  Occupational History   Not on file  Tobacco Use   Smoking status: Never   Smokeless tobacco: Never  Vaping Use   Vaping status: Never Used  Substance and Sexual Activity   Alcohol use: No   Drug use: No    Sexual activity: Not Currently  Other Topics Concern   Not on file  Social History Narrative   Right handed    Lives alone at home    No caffeine usage    Social Determinants of Health   Financial Resource Strain: Not on file  Food Insecurity: Not on file  Transportation Needs: Not on file  Physical Activity: Not on file  Stress: Not on file  Social Connections: Not on file   Additional Social History:                         Sleep: Good  Appetite:  Good  Current Medications: Current Outpatient Medications  Medication Sig Dispense Refill   Cholecalciferol (VITAMIN D-3) 1000 UNITS CAPS Take 2,000 Units by mouth daily.     cycloSPORINE (RESTASIS) 0.05 % ophthalmic emulsion Place 1 drop into both eyes daily.      vitamin B-12 (CYANOCOBALAMIN) 1000 MCG tablet Take 1,000 mcg by mouth daily.     ARIPiprazole (ABILIFY) 10 MG tablet 1  qam 30 tablet 10   citalopram (CELEXA) 20 MG tablet Take 1 tablet (20 mg total)  by mouth daily. 90 tablet 4   gabapentin (NEURONTIN) 100 MG capsule Take 100 mg by mouth at bedtime.     loteprednol (LOTEMAX) 0.5 % ophthalmic suspension 1 drop into affected eye     Magnesium 200 MG TABS 1 tablet with a meal     pantoprazole (PROTONIX) 40 MG tablet Take 40 mg by mouth daily.     SHINGRIX injection      Turmeric 500 MG CAPS 1 capsule     valbenazine (INGREZZA) 60 MG capsule Take 1 capsule (60 mg total) by mouth daily. 30 capsule 5   No current facility-administered medications for this visit.    Lab Results: No results found for this or any previous visit (from the past 48 hour(s)).  Physical Findings: AIMS:  , ,  ,  ,    CIWA:    COWS:     Musculoskeletal: Strength & Muscle Tone: within normal limits Gait & Station: normal Patient leans: Right  Psychiatric Specialty Exam: ROS  Blood pressure (!) 141/74, pulse 60, resp. rate 16.There is no height or weight on file to calculate BMI.  General Appearance: Casual  Eye Contact::   Good  Speech:  Clear and Coherent  Volume:  Normal  Mood:  NA  Affect:  Congruent  Thought Process:  Coherent  Orientation:  Full (Time, Place, and Person)  Thought Content:  WDL  Suicidal Thoughts:  No  Homicidal Thoughts:  No  Memory:  NA  Judgement:  NA  Insight:  Good  Psychomotor Activity:  Normal  Concentration:  Good  Recall:  Good  Fund of Knowledge:Good  Language: Good  Akathisia:  No  Handed:  Right  AIMS (if indicated):     Assets:  Desire for Improvement  ADL's:  Intact  Cognition: WNL  Sleep:       [name] 05/18/2023, 2:56 PM   This patient has 2 problems.  The first 1 is major clinical depression.  She will continue taking her Celexa together with 10 mg of Abilify.  Her second problem is tardive dyskinesia.  This seems to be completely resolved taking Ingrezza 60 mg.  She will return to see me in 3 months.  She is active and back to her baseline.. I connected with Pierra Skora Crafton on 05/18/23 at  2:30 PM EST by telephone and verified that I am speaking with the correct person using two identifiers.  Location: Patient: home Provider: office   I discussed the limitations, risks, security and privacy concerns of performing an evaluation and management service by telephone and the availability of in person appointments. I also discussed with the patient that there may be a patient responsible charge related to this service. The patient expressed understanding and agreed to proceed.      I discussed the assessment and treatment plan with the patient. The patient was provided an opportunity to ask questions and all were answered. The patient agreed with the plan and demonstrated an understanding of the instructions.   The patient was advised to call back or seek an in-person evaluation if the symptoms worsen or if the condition fails to improve as anticipated.  I provided 30 minutes of non-face-to-face time during this encounter.   [name], MD

## 2023-05-23 ENCOUNTER — Other Ambulatory Visit (HOSPITAL_COMMUNITY): Payer: Self-pay

## 2023-05-23 MED ORDER — VALBENAZINE TOSYLATE 60 MG PO CAPS: 60.0000 mg | ORAL_CAPSULE | Freq: Every day | ORAL | 11 refills | Status: DC

## 2023-05-23 MED ORDER — VALBENAZINE TOSYLATE 60 MG PO CAPS: 60.0000 mg | ORAL_CAPSULE | Freq: Every day | ORAL | 5 refills | Status: DC

## 2023-05-26 ENCOUNTER — Ambulatory Visit: Payer: Medicare Other | Admitting: Psychology

## 2023-05-26 DIAGNOSIS — F325 Major depressive disorder, single episode, in full remission: Secondary | ICD-10-CM | POA: Diagnosis not present

## 2023-05-26 DIAGNOSIS — L814 Other melanin hyperpigmentation: Secondary | ICD-10-CM | POA: Diagnosis not present

## 2023-05-26 DIAGNOSIS — D225 Melanocytic nevi of trunk: Secondary | ICD-10-CM | POA: Diagnosis not present

## 2023-05-26 DIAGNOSIS — L821 Other seborrheic keratosis: Secondary | ICD-10-CM | POA: Diagnosis not present

## 2023-05-26 NOTE — Progress Notes (Signed)
[facility] Counselor/Therapist Progress Note  Patient ID: Sylvia Maldonado, MRN: [id number],    Date: 05/26/2023  Time Spent: 45 minutes  Time in: 2:15 Time out:  3:00  Treatment Type: Individual Therapy  Reported Symptoms: depression  Mental Status Exam: Appearance:  Casual     Behavior: Appropriate  Motor: Normal  Speech/Language:  Normal Rate  Affect: Appropriate  Mood: pleasant  Thought process: normal  Thought content:   WNL  Sensory/Perceptual disturbances:   WNL  Orientation: oriented to person, place, time/date, and situation  Attention: Good  Concentration: Good  Memory: WNL  Fund of knowledge:  Good  Insight:   Good  Judgment:  Good  Impulse Control: Good   Risk Assessment: Danger to Self:  No Self-injurious Behavior: No Danger to Others: No Duty to Warn:no Physical Aggression / Violence:No  Access to Firearms a concern: No  Gang Involvement:No   Subjective: The patient attended an individual therapy session via video visit.  The patient gave verbal consent for the session to be the on video on caregility and is aware of the limitations of telehealth.  The patient was in her home alone and the therapist was in the office.  The patient presents with a flat affect and mood is pleasant.  The patient reports that she feels much better because her Abilify is back in her system.  She reports that Dr. Donell Beers put her on Ingrezza and she cannot afford it.  The [facility] is for her tardive dyskinesia.  I encouraged her to make sure that they pursue a patient assistance program for her since she cannot afford the drug out of pocket.  She continues to walk and is going to her daughter's for Thanksgiving.  She reports that she is doing so much better since her health issues have resolved.  She did talk about some of her feelings about the.  Interventions: Cognitive Behavioral Therapy and Assertiveness/Communication  Diagnosis:Major depression in remission  [facility])  Plan: Treatment Plan  Strengths/Abilities:  Intelligent, insightful  Treatment Preferences:  Outpatient Individual therapy  Statement of Needs:  "I need a therapist to help me with my depression"  Symptoms:  decreased motivation: (Status: improved). poor concentration: (Status: improved). poor sleep: (Status: improved).  Problems Addressed:  Unipolar depression  Goals: 1. New Goal Statement for Unipolar Depression  Describe current and past experiences with depression including their impact on functioning and  attempts to resolve it. Target Date: 2023-09-21 Frequency: bi weekly Progress: 60 Modality: individual  2.Identify and replace thoughts and beliefs that support depression. Target Date: 2023-09-21 Frequency: bi weekly Progress: 70 Modality: individual  3.  Learn and implement behavioral strategies to overcome depression. Target Date: 2023-09-21 Frequency: bi weekly Progress: 70 Modality: individual  4.  Learn and implement problem-solving and decision-making skills. Target Date: 2023-09-21 Frequency: bi weekly Progress: 90 Modality: individual   Interventions by Therapist:  CBT , insight oriented approach and problem solving therapy.  Treatment plan created with patient and patient approves plan.  Marticia Reifschneider G Emmilee Reamer, LCSW

## 2023-06-08 ENCOUNTER — Ambulatory Visit: Payer: Medicare Other | Admitting: Psychology

## 2023-06-08 DIAGNOSIS — F325 Major depressive disorder, single episode, in full remission: Secondary | ICD-10-CM | POA: Diagnosis not present

## 2023-06-08 NOTE — Progress Notes (Signed)
[facility] Counselor/Therapist Progress Note  Patient ID: Sylvia Maldonado, MRN: [id number],    Date: 06/08/2023  Time Spent: 30 minutes  Time in: 2:01 Time out:  2:31  Treatment Type: Individual Therapy  Reported Symptoms: depression  Mental Status Exam: Appearance:  Casual     Behavior: Appropriate  Motor: Normal  Speech/Language:  Normal Rate  Affect: Appropriate  Mood: pleasant  Thought process: normal  Thought content:   WNL  Sensory/Perceptual disturbances:   WNL  Orientation: oriented to person, place, time/date, and situation  Attention: Good  Concentration: Good  Memory: WNL  Fund of knowledge:  Good  Insight:   Good  Judgment:  Good  Impulse Control: Good   Risk Assessment: Danger to Self:  No Self-injurious Behavior: No Danger to Others: No Duty to Warn:no Physical Aggression / Violence:No  Access to Firearms a concern: No  Gang Involvement:No   Subjective: The patient attended an individual therapy session via video visit.  The patient gave verbal consent for the session to be the on video on caregility and is aware of the limitations of telehealth.  The patient was in her home alone and the therapist was in the office.  The patient presents with a flat affect and mood is pleasant.  The patient reports that things are going well.  She states she feels better now that she is back on the Abilify.  She reports that she never did find out about Ingrezza and she would prefer not to be on any more medicine if she does not have to.  She is planning to go tomorrow to [place] to spend time with her family for Thanksgiving.  She is excited about this because she is not going to be required to make any food.  She told me that her daughter lost her job and she is a little worried about that.  I told her that her daughter is likely to be fine and that she will come up with a way to manage.  The patient had nothing much to talk about today so we ended the  session after 30 minutes.  She seems to be doing well and I recommended that if her tremors get worse that she call Dr. Donell Beers.    Interventions: Cognitive Behavioral Therapy and Assertiveness/Communication  Diagnosis:Major depression in remission [facility])  Plan: Treatment Plan  Strengths/Abilities:  Intelligent, insightful  Treatment Preferences:  Outpatient Individual therapy  Statement of Needs:  "I need a therapist to help me with my depression"  Symptoms:  decreased motivation: (Status: improved). poor concentration: (Status: improved). poor sleep: (Status: improved).  Problems Addressed:  Unipolar depression  Goals: 1. New Goal Statement for Unipolar Depression  Describe current and past experiences with depression including their impact on functioning and  attempts to resolve it. Target Date: 2023-09-21 Frequency: bi weekly Progress: 60 Modality: individual  2.Identify and replace thoughts and beliefs that support depression. Target Date: 2023-09-21 Frequency: bi weekly Progress: 70 Modality: individual  3.  Learn and implement behavioral strategies to overcome depression. Target Date: 2023-09-21 Frequency: bi weekly Progress: 70 Modality: individual  4.  Learn and implement problem-solving and decision-making skills. Target Date: 2023-09-21 Frequency: bi weekly Progress: 90 Modality: individual   Interventions by Therapist:  CBT , insight oriented approach and problem solving therapy.  Treatment plan created with patient and patient approves plan.  Sylvia Maldonado G Sylvia Mainville, LCSW

## 2023-06-23 ENCOUNTER — Ambulatory Visit (INDEPENDENT_AMBULATORY_CARE_PROVIDER_SITE_OTHER): Payer: Medicare Other | Admitting: Psychology

## 2023-06-23 DIAGNOSIS — F320 Major depressive disorder, single episode, mild: Secondary | ICD-10-CM

## 2023-06-23 NOTE — Progress Notes (Signed)
[facility] Counselor/Therapist Progress Note  Patient ID: Sylvia Maldonado, MRN: [id number],    Date: 06/23/2023  Time Spent:  47 minutes  Time in: 2:06 Time out:  2:53  Treatment Type: Individual Therapy  Reported Symptoms: depression  Mental Status Exam: Appearance:  Casual     Behavior: Appropriate  Motor: Normal  Speech/Language:  Normal Rate  Affect: Appropriate  Mood: pleasant  Thought process: normal  Thought content:   WNL  Sensory/Perceptual disturbances:   WNL  Orientation: oriented to person, place, time/date, and situation  Attention: Good  Concentration: Good  Memory: WNL  Fund of knowledge:  Good  Insight:   Good  Judgment:  Good  Impulse Control: Good   Risk Assessment: Danger to Self:  No Self-injurious Behavior: No Danger to Others: No Duty to Warn:no Physical Aggression / Violence:No  Access to Firearms a concern: No  Gang Involvement:No   Subjective: The patient attended an individual therapy session via video visit.  The patient gave verbal consent for the session to be the on video on caregility and is aware of the limitations of telehealth.  The patient was in her home alone and the therapist was in the office.  The patient presents with a flat affect and mood is pleasant.  The patient reports that things are going okay.  She had a nice time at Thanksgiving going down to [place] and reports that her daughter did a good job of looking after her during that time.  She is planning to spend Christmas with her other daughter up here.  We talked about her frustrations with dealing with her daughter's in-laws at previous Christmases and talked about how to manage her feelings about that.  In addition we talked about some resources for seniors that she was not aware of.  I referred her to [facility].   Interventions: Cognitive Behavioral Therapy and Assertiveness/Communication  Diagnosis:Current mild episode of  major depressive disorder, unspecified whether recurrent (HCC)  Plan: Treatment Plan  Strengths/Abilities:  Intelligent, insightful  Treatment Preferences:  Outpatient Individual therapy  Statement of Needs:  "I need a therapist to help me with my depression"  Symptoms:  decreased motivation: (Status: improved). poor concentration: (Status: improved). poor sleep: (Status: improved).  Problems Addressed:  Unipolar depression  Goals: 1. New Goal Statement for Unipolar Depression  Describe current and past experiences with depression including their impact on functioning and  attempts to resolve it. Target Date: 2023-09-21 Frequency: bi weekly Progress: 60 Modality: individual  2.Identify and replace thoughts and beliefs that support depression. Target Date: 2023-09-21 Frequency: bi weekly Progress: 70 Modality: individual  3.  Learn and implement behavioral strategies to overcome depression. Target Date: 2023-09-21 Frequency: bi weekly Progress: 70 Modality: individual  4.  Learn and implement problem-solving and decision-making skills. Target Date: 2023-09-21 Frequency: bi weekly Progress: 90 Modality: individual   Interventions by Therapist:  CBT , insight oriented approach and problem solving therapy.  Treatment plan created with patient and patient approves plan.  [name], LCSW

## 2023-07-07 ENCOUNTER — Ambulatory Visit: Payer: Medicare Other | Admitting: Psychology

## 2023-07-21 ENCOUNTER — Ambulatory Visit (INDEPENDENT_AMBULATORY_CARE_PROVIDER_SITE_OTHER): Payer: Medicare Other | Admitting: Psychology

## 2023-07-21 DIAGNOSIS — F320 Major depressive disorder, single episode, mild: Secondary | ICD-10-CM | POA: Diagnosis not present

## 2023-07-21 NOTE — Progress Notes (Signed)
 [facility] Counselor/Therapist Progress Note  Patient ID: Sylvia Maldonado, MRN: [id number],    Date: 07/21/2023  Time Spent:  52 minutes  Time in: 2:06 Time out:  2:58  Treatment Type: Individual Therapy  Reported Symptoms: depression  Mental Status Exam: Appearance:  Casual     Behavior: Appropriate  Motor: Normal  Speech/Language:  Normal Rate  Affect: Appropriate  Mood: pleasant  Thought process: normal  Thought content:   WNL  Sensory/Perceptual disturbances:   WNL  Orientation: oriented to person, place, time/date, and situation  Attention: Good  Concentration: Good  Memory: WNL  Fund of knowledge:  Good  Insight:   Good  Judgment:  Good  Impulse Control: Good   Risk Assessment: Danger to Self:  No Self-injurious Behavior: No Danger to Others: No Duty to Warn:no Physical Aggression / Violence:No  Access to Firearms a concern: No  Gang Involvement:No   Subjective: The patient attended an individual therapy session via video visit.  The patient gave verbal consent for the session to be the on video on caregility and is aware of the limitations of telehealth.  The patient was in her home alone and the therapist was in the office.  The patient presents with a flat affect and mood is pleasant.  The patient reports that her Christmas was good and she apparently had a good holiday.  She seems to be doing much better now than she was in the recent past.  She states that since she has been back on the Abilify  she seems to be doing better.  She still questions why her physician took her off of the medicine and I explained to her that he probably wanted to make sure that she was not having tardive dyskinesia.  And he was trying to see whether she could do without the medication that causes that.  She is continuing to walk and take care of herself and had no specific image issues to deal with today.  Interventions: Cognitive Behavioral Therapy and  Assertiveness/Communication  Diagnosis:Current mild episode of major depressive disorder, unspecified whether recurrent (HCC)  Plan: Treatment Plan  Strengths/Abilities:  Intelligent, insightful  Treatment Preferences:  Outpatient Individual therapy  Statement of Needs:  I need a therapist to help me with my depression  Symptoms:  decreased motivation: (Status: improved). poor concentration: (Status: improved). poor sleep: (Status: improved).  Problems Addressed:  Unipolar depression  Goals: 1. New Goal Statement for Unipolar Depression  Describe current and past experiences with depression including their impact on functioning and  attempts to resolve it. Target Date: 2023-09-21 Frequency: bi weekly Progress: 60 Modality: individual  2.Identify and replace thoughts and beliefs that support depression. Target Date: 2023-09-21 Frequency: bi weekly Progress: 70 Modality: individual  3.  Learn and implement behavioral strategies to overcome depression. Target Date: 2023-09-21 Frequency: bi weekly Progress: 70 Modality: individual  4.  Learn and implement problem-solving and decision-making skills. Target Date: 2023-09-21 Frequency: bi weekly Progress: 90 Modality: individual   Interventions by Therapist:  CBT , insight oriented approach and problem solving therapy.  Treatment plan created with patient and patient approves plan.  Cavan Bearden G Janyce Ellinger, LCSW

## 2023-07-26 ENCOUNTER — Encounter: Payer: Self-pay | Admitting: Podiatry

## 2023-07-26 ENCOUNTER — Ambulatory Visit (INDEPENDENT_AMBULATORY_CARE_PROVIDER_SITE_OTHER): Payer: Medicare Other | Admitting: Podiatry

## 2023-07-26 DIAGNOSIS — M79674 Pain in right toe(s): Secondary | ICD-10-CM | POA: Diagnosis not present

## 2023-07-26 DIAGNOSIS — B351 Tinea unguium: Secondary | ICD-10-CM | POA: Diagnosis not present

## 2023-07-26 DIAGNOSIS — M79675 Pain in left toe(s): Secondary | ICD-10-CM | POA: Diagnosis not present

## 2023-07-26 DIAGNOSIS — L84 Corns and callosities: Secondary | ICD-10-CM

## 2023-07-26 NOTE — Progress Notes (Signed)
This patient returns to the office for evaluation and treatment of long thick painful nails .  This patient is unable to trim her own nails since the patient cannot reach her feet.  Patient says the nails are painful walking and wearing his shoes.  She returns for preventive foot care services.  General Appearance  Alert, conversant and in no acute stress.  Vascular  Dorsalis pedis and posterior tibial  pulses are palpable  bilaterally.  Capillary return is within normal limits  bilaterally. Temperature is within normal limits  bilaterally.  Neurologic  Senn-Weinstein monofilament wire test within normal limits  bilaterally. Muscle power within normal limits bilaterally.  Nails Thick disfigured discolored nails with subungual debris  from hallux to fifth toes bilaterally. No evidence of bacterial infection or drainage bilaterally.  Orthopedic  No limitations of motion  feet .  No crepitus or effusions noted.   HAV 1st MPJ  B/L.  Hammer toes 2-5  B/L. Plantar flexed fifth met  B/L.  Skin  normotropic skin with no porokeratosis noted bilaterally.  No signs of infections or ulcers noted.   Callus subfifth met  right foot.  Onychomycosis  Pain in toes right foot  Pain in toes left foot   Debridement  of nails  1-5  B/L with a nail nipper.  Nails were then filed using a dremel tool with no incidents. RTC 10 weeks    Helane Gunther DPM

## 2023-08-04 ENCOUNTER — Ambulatory Visit: Payer: Medicare Other | Admitting: Psychology

## 2023-08-04 DIAGNOSIS — F320 Major depressive disorder, single episode, mild: Secondary | ICD-10-CM

## 2023-08-04 NOTE — Progress Notes (Signed)
[facility] Counselor/Therapist Progress Note  Patient ID: Sylvia Maldonado, MRN: [id number],    Date: 08/04/2023  Time Spent:  20 minutes  Time in: 2:08 Time out:  2:28  Treatment Type: Individual Therapy  Reported Symptoms: depression  Mental Status Exam: Appearance:  Casual     Behavior: Appropriate  Motor: Normal  Speech/Language:  Normal Rate  Affect: Appropriate  Mood: pleasant  Thought process: normal  Thought content:   WNL  Sensory/Perceptual disturbances:   WNL  Orientation: oriented to person, place, time/date, and situation  Attention: Good  Concentration: Good  Memory: WNL  Fund of knowledge:  Good  Insight:   Good  Judgment:  Good  Impulse Control: Good   Risk Assessment: Danger to Self:  No Self-injurious Behavior: No Danger to Others: No Duty to Warn:no Physical Aggression / Violence:No  Access to Firearms a concern: No  Gang Involvement:No   Subjective: The patient attended an individual therapy session via video visit.  The patient gave verbal consent for the session to be the on video on caregility and is aware of the limitations of telehealth.  The patient was in her home alone and the therapist was in the office.  The patient presents with a flat affect and mood is pleasant.  The patient reports that she messed up her day and that she scheduled someone to come at 2:30 today so we had to end the session early.  The patient reports that she is a little upset because her kitchen floors are going to have to be replaced because there is water underneath the tiles.  The patient has a hard time dealing with spending money.  We did a little bit of reframing about the situation and I told her it is a good thing that her home is very small and she does not have to tile a large area.  We also talked about the good news is is that she caught up before she developed any problems with mold.  We ended the session early and I will see her again in 2  weeks.   Interventions: Cognitive Behavioral Therapy and Assertiveness/Communication  Diagnosis:Current mild episode of major depressive disorder, unspecified whether recurrent (HCC)  Plan: Treatment Plan  Strengths/Abilities:  Intelligent, insightful  Treatment Preferences:  Outpatient Individual therapy  Statement of Needs:  "I need a therapist to help me with my depression"  Symptoms:  decreased motivation: (Status: improved). poor concentration: (Status: improved). poor sleep: (Status: improved).  Problems Addressed:  Unipolar depression  Goals: 1. New Goal Statement for Unipolar Depression  Describe current and past experiences with depression including their impact on functioning and  attempts to resolve it. Target Date: 2023-09-21 Frequency: bi weekly Progress: 60 Modality: individual  2.Identify and replace thoughts and beliefs that support depression. Target Date: 2023-09-21 Frequency: bi weekly Progress: 70 Modality: individual  3.  Learn and implement behavioral strategies to overcome depression. Target Date: 2023-09-21 Frequency: bi weekly Progress: 70 Modality: individual  4.  Learn and implement problem-solving and decision-making skills. Target Date: 2023-09-21 Frequency: bi weekly Progress: 90 Modality: individual   Interventions by Therapist:  CBT , insight oriented approach and problem solving therapy.  Treatment plan created with patient and patient approves plan.  Dhani Imel G Adeena Bernabe, LCSW

## 2023-08-16 ENCOUNTER — Other Ambulatory Visit (HOSPITAL_COMMUNITY): Payer: Self-pay | Admitting: Psychiatry

## 2023-08-18 ENCOUNTER — Ambulatory Visit: Payer: Medicare Other | Admitting: Psychology

## 2023-08-18 ENCOUNTER — Other Ambulatory Visit (HOSPITAL_COMMUNITY): Payer: Self-pay | Admitting: Psychiatry

## 2023-08-18 DIAGNOSIS — F320 Major depressive disorder, single episode, mild: Secondary | ICD-10-CM

## 2023-08-18 NOTE — Progress Notes (Signed)
 [facility] Counselor/Therapist Progress Note  Patient ID: Sylvia Maldonado, MRN: [id number],    Date: 08/18/2023  Time Spent:  51 minutes  Time in: 2:04 Time out:  2:55  Treatment Type: Individual Therapy  Reported Symptoms: depression  Mental Status Exam: Appearance:  Casual     Behavior: Appropriate  Motor: Normal  Speech/Language:  Normal Rate  Affect: Appropriate  Mood: pleasant  Thought process: normal  Thought content:   WNL  Sensory/Perceptual disturbances:   WNL  Orientation: oriented to person, place, time/date, and situation  Attention: Good  Concentration: Good  Memory: WNL  Fund of knowledge:  Good  Insight:   Good  Judgment:  Good  Impulse Control: Good   Risk Assessment: Danger to Self:  No Self-injurious Behavior: No Danger to Others: No Duty to Warn:no Physical Aggression / Violence:No  Access to Firearms a concern: No  Gang Involvement:No   Subjective: The patient attended an individual therapy session via video visit.  The patient gave verbal consent for the session to be the on video on caregility and is aware of the limitations of telehealth.  The patient was in her home alone and the therapist was in the office.  The patient presents with a flat affect and mood is pleasant. The patient reports that she has decided to have her handyman lay her floor when he can.  We talked about how she has been doing and she reports that she has been good since she is back on the abilify .  The patient reports that she is continuing to do her exercise and seems to be handling things well.  She states that she is having tremors, but is okay with it because she feels better on the medications she is on.  She seems to be very stable right now.  She wants to continue with therapy because she feels that she needs to check in every few weeks and that it is helpful if she has anything in particular that she feels like she wants to process.   Interventions:  Cognitive Behavioral Therapy and Assertiveness/Communication  Diagnosis:Current mild episode of major depressive disorder, unspecified whether recurrent (HCC)  Plan: Treatment Plan  Strengths/Abilities:  Intelligent, insightful  Treatment Preferences:  Outpatient Individual therapy  Statement of Needs:  I need a therapist to help me with my depression  Symptoms:  decreased motivation: (Status: improved). poor concentration: (Status: improved). poor sleep: (Status: improved).  Problems Addressed:  Unipolar depression  Goals: 1. New Goal Statement for Unipolar Depression  Describe current and past experiences with depression including their impact on functioning and  attempts to resolve it. Target Date: 2023-09-21 Frequency: bi weekly Progress: 70 Modality: individual  2.Identify and replace thoughts and beliefs that support depression. Target Date: 2023-09-21 Frequency: bi weekly Progress: 80 Modality: individual  3.  Learn and implement behavioral strategies to overcome depression. Target Date: 2023-09-21 Frequency: bi weekly Progress: 80 Modality: individual  4.  Learn and implement problem-solving and decision-making skills. Target Date: 2023-09-21 Frequency: bi weekly Progress: 90 Modality: individual   Interventions by Therapist:  CBT , insight oriented approach and problem solving therapy.  Treatment plan created with patient and patient approves plan.  Leonardo Makris G Fleming Prill, LCSW

## 2023-08-30 ENCOUNTER — Ambulatory Visit (HOSPITAL_BASED_OUTPATIENT_CLINIC_OR_DEPARTMENT_OTHER): Payer: Medicare Other | Admitting: Psychiatry

## 2023-08-30 ENCOUNTER — Other Ambulatory Visit: Payer: Self-pay

## 2023-08-30 VITALS — BP 145/75 | HR 65 | Ht 64.5 in | Wt 131.0 lb

## 2023-08-30 DIAGNOSIS — F325 Major depressive disorder, single episode, in full remission: Secondary | ICD-10-CM

## 2023-08-30 MED ORDER — VALBENAZINE TOSYLATE 60 MG PO CAPS: 60.0000 mg | ORAL_CAPSULE | Freq: Every day | ORAL | 5 refills | Status: DC

## 2023-08-30 MED ORDER — CITALOPRAM HYDROBROMIDE 20 MG PO TABS: 20.0000 mg | ORAL_TABLET | Freq: Every day | ORAL | 4 refills | Status: DC

## 2023-08-30 MED ORDER — ARIPIPRAZOLE 10 MG PO TABS: ORAL_TABLET | ORAL | 10 refills | Status: DC

## 2023-08-30 NOTE — Progress Notes (Signed)
 Patient ID: Sylvia Maldonado, female   DOB: 11/21/1940, 83 y.o.   MRN: [id number] [facility] MD Progress Note  08/30/2023 5:28 PM Sylvia Maldonado  MRN:  [id number] Subjective: I am feel awful; I feel depressed. Principal Problem: Major depression in remission Diagnosis: Major depression recurrent   Today patient is seen in the office.  Unfortunately we had to start half an hour late.  Did not seem to bother her very much.  The patient says she is doing great.  She exercises regularly.  Her family is doing good.  The patient says her mood is good.  She is sleeping and eating well.  She denies any depression.  She is not she has no complaints about any movements.  She continues taking Celexa.  She continues taking Abilify as prescribed.  I suspect she will stay on Abilify indefinitely but according to my last note she is 60 mg of Ingrezza.  She says that she is not on it.  At this time we will leave things as they are as she is very happy with how she is doing.  She shows no overt evidence of tardive dyskinesia.            Past Surgical History:  Procedure Laterality Date   cataracts surg     COLONOSCOPY     EYE SURGERY     PELVIC FLOOR REPAIR     TONSILLECTOMY     VAGINAL HYSTERECTOMY     Family History:  Family History  Problem Relation Age of Onset   CAD Neg Hx    Family Psychiatric  History:  Social History:  Social History   Substance and Sexual Activity  Alcohol Use No     Social History   Substance and Sexual Activity  Drug Use No    Social History   Socioeconomic History   Marital status: Single    Spouse name: Not on file   Number of children: Not on file   Years of education: Not on file   Highest education level: Not on file  Occupational History   Not on file  Tobacco Use   Smoking status: Never   Smokeless tobacco: Never  Vaping Use   Vaping status: Never Used  Substance and Sexual Activity   Alcohol use: No   Drug use: No   Sexual activity:  Not Currently  Other Topics Concern   Not on file  Social History Narrative   Right handed    Lives alone at home    No caffeine usage    Social Drivers of Corporate investment banker Strain: Not on file  Food Insecurity: Not on file  Transportation Needs: Not on file  Physical Activity: Not on file  Stress: Not on file  Social Connections: Not on file   Additional Social History:                         Sleep: Good  Appetite:  Good  Current Medications: Current Outpatient Medications  Medication Sig Dispense Refill   ARIPiprazole (ABILIFY) 10 MG tablet 1  qam 30 tablet 10   Cholecalciferol (VITAMIN D-3) 1000 UNITS CAPS Take 2,000 Units by mouth daily.     citalopram (CELEXA) 20 MG tablet Take 1 tablet (20 mg total) by mouth daily. 90 tablet 4   cycloSPORINE (RESTASIS) 0.05 % ophthalmic emulsion Place 1 drop into both eyes daily.      gabapentin (NEURONTIN) 100 MG capsule Take  100 mg by mouth at bedtime.     loteprednol (LOTEMAX) 0.5 % ophthalmic suspension 1 drop into affected eye     Magnesium 200 MG TABS 1 tablet with a meal     pantoprazole (PROTONIX) 40 MG tablet Take 40 mg by mouth daily.     SHINGRIX injection      Turmeric 500 MG CAPS 1 capsule     valbenazine (INGREZZA) 60 MG capsule Take 1 capsule (60 mg total) by mouth daily. 30 capsule 5   vitamin B-12 (CYANOCOBALAMIN) 1000 MCG tablet Take 1,000 mcg by mouth daily.     No current facility-administered medications for this visit.    Lab Results: No results found for this or any previous visit (from the past 48 hours).  Physical Findings: AIMS:  , ,  ,  ,    CIWA:    COWS:     Musculoskeletal: Strength & Muscle Tone: within normal limits Gait & Station: normal Patient leans: Right  Psychiatric Specialty Exam: ROS  Blood pressure (!) 145/75, pulse 65, height 5' 4.5" (1.638 m), weight 131 lb (59.4 kg).Body mass index is 22.14 kg/m.  General Appearance: Casual  Eye Contact::  Good  Speech:   Clear and Coherent  Volume:  Normal  Mood:  NA  Affect:  Congruent  Thought Process:  Coherent  Orientation:  Full (Time, Place, and Person)  Thought Content:  WDL  Suicidal Thoughts:  No  Homicidal Thoughts:  No  Memory:  NA  Judgement:  NA  Insight:  Good  Psychomotor Activity:  Normal  Concentration:  Good  Recall:  Good  Fund of Knowledge:Good  Language: Good  Akathisia:  No  Handed:  Right  AIMS (if indicated):     Assets:  Desire for Improvement  ADL's:  Intact  Cognition: WNL  Sleep:       [name] 08/30/2023, 5:28 PM   This patient's diagnosis is major depression.  She will continue taking her Abilify and her Celexa as well.  According to her she no longer is on [place].  It would not surprise me if she calls back and realizes that she has made a mistake and that she is taking Ingrezza.  In the chart she was recently prescribed and again in February and has refills.  I will wait to see if she calls back for now she will be given a return appointment to see me in 4 to 5 months.  Emotionally she is very stable. I connected with Sylvia Maldonado on 08/30/23 at  4:00 PM EST by telephone and verified that I am speaking with the correct person using two identifiers.  Location: Patient: home Provider: office   I discussed the limitations, risks, security and privacy concerns of performing an evaluation and management service by telephone and the availability of in person appointments. I also discussed with the patient that there may be a patient responsible charge related to this service. The patient expressed understanding and agreed to proceed.      I discussed the assessment and treatment plan with the patient. The patient was provided an opportunity to ask questions and all were answered. The patient agreed with the plan and demonstrated an understanding of the instructions.   The patient was advised to call back or seek an in-person evaluation if the  symptoms worsen or if the condition fails to improve as anticipated.  I provided 30 minutes of non-face-to-face time during this encounter.   [name],  MD

## 2023-08-31 ENCOUNTER — Ambulatory Visit (HOSPITAL_COMMUNITY): Payer: Medicare Other | Admitting: Psychiatry

## 2023-09-01 ENCOUNTER — Ambulatory Visit: Payer: Medicare Other | Admitting: Psychology

## 2023-09-01 DIAGNOSIS — F325 Major depressive disorder, single episode, in full remission: Secondary | ICD-10-CM | POA: Diagnosis not present

## 2023-09-01 NOTE — Progress Notes (Signed)
 [facility] Counselor/Therapist Progress Note  Patient ID: Sylvia Maldonado, MRN: [id number],    Date: 09/01/2023  Time Spent:  49 minutes  Time in: 2:03 Time out:  2:52  Treatment Type: Individual Therapy  Reported Symptoms: depression  Mental Status Exam: Appearance:  Casual     Behavior: Appropriate  Motor: Normal  Speech/Language:  Normal Rate  Affect: Appropriate  Mood: pleasant  Thought process: normal  Thought content:   WNL  Sensory/Perceptual disturbances:   WNL  Orientation: oriented to person, place, time/date, and situation  Attention: Good  Concentration: Good  Memory: WNL  Fund of knowledge:  Good  Insight:   Good  Judgment:  Good  Impulse Control: Good   Risk Assessment: Danger to Self:  No Self-injurious Behavior: No Danger to Others: No Duty to Warn:no Physical Aggression / Violence:No  Access to Firearms a concern: No  Gang Involvement:No   Subjective: The patient attended an individual therapy session via video visit.  The patient gave verbal consent for the session to be the on video on caregility and is aware of the limitations of telehealth.  The patient was in her home alone and the therapist was in the office.  The patient presents with a flat affect and mood is pleasant.  The patient reports that she feels like she is still doing well.  She does say that her handyman is coming today and they are going to be doing some projects around her home which she feels like needs to happen.  To walk and take care of herself in that way.  We did a little problem solving on how she could pick out what it is she wanted to use for her floors as she sometimes had difficulty with those kinds of decisions.  The patient is doing very well and we will continue to meet on a biweekly basis at the patient's request.    Interventions: Cognitive Behavioral Therapy and Assertiveness/Communication  Diagnosis:Major depression in remission [facility])  Plan:  Treatment Plan  Strengths/Abilities:  Intelligent, insightful  Treatment Preferences:  Outpatient Individual therapy  Statement of Needs:  "I need a therapist to help me with my depression"  Symptoms:  decreased motivation: (Status: improved). poor concentration: (Status: improved). poor sleep: (Status: improved).  Problems Addressed:  Unipolar depression  Goals: 1. New Goal Statement for Unipolar Depression  Describe current and past experiences with depression including their impact on functioning and  attempts to resolve it. Target Date: 2023-09-21 Frequency: bi weekly Progress: 70 Modality: individual  2.Identify and replace thoughts and beliefs that support depression. Target Date: 2023-09-21 Frequency: bi weekly Progress: 80 Modality: individual  3.  Learn and implement behavioral strategies to overcome depression. Target Date: 2023-09-21 Frequency: bi weekly Progress: 80 Modality: individual  4.  Learn and implement problem-solving and decision-making skills. Target Date: 2023-09-21 Frequency: bi weekly Progress: 90 Modality: individual   Interventions by Therapist:  CBT , insight oriented approach and problem solving therapy.  Treatment plan created with patient and patient approves plan.  Zawadi Aplin G Kessie Croston, LCSW

## 2023-09-15 ENCOUNTER — Ambulatory Visit: Payer: Medicare Other | Admitting: Psychology

## 2023-09-15 DIAGNOSIS — F325 Major depressive disorder, single episode, in full remission: Secondary | ICD-10-CM

## 2023-09-15 DIAGNOSIS — F4323 Adjustment disorder with mixed anxiety and depressed mood: Secondary | ICD-10-CM | POA: Diagnosis not present

## 2023-09-15 NOTE — Progress Notes (Signed)
 [facility] Counselor/Therapist Progress Note  Patient ID: Sylvia Maldonado, MRN: [id number],    Date: 09/15/2023  Time Spent:  50 minutes  Time in: 2:04 Time out:  2:54  Treatment Type: Individual Therapy  Reported Symptoms: depression  Mental Status Exam: Appearance:  Casual     Behavior: Appropriate  Motor: Normal  Speech/Language:  Normal Rate  Affect: Appropriate  Mood: A little sad  Thought process: normal  Thought content:   WNL  Sensory/Perceptual disturbances:   WNL  Orientation: oriented to person, place, time/date, and situation  Attention: Good  Concentration: Good  Memory: WNL  Fund of knowledge:  Good  Insight:   Good  Judgment:  Good  Impulse Control: Good   Risk Assessment: Danger to Self:  No Self-injurious Behavior: No Danger to Others: No Duty to Warn:no Physical Aggression / Violence:No  Access to Firearms a concern: No  Gang Involvement:No   Subjective: The patient attended an individual therapy session via video visit.  The patient gave verbal consent for the session to be the on video on caregility and is aware of the limitations of telehealth.  The patient was in her home alone and the therapist was in the office.  The patient presents with a flat affect and mood is a little sad today.  The patient reports that she is feeling a little depressed today.  We talked about what might be causing some of the sadness and it seems that she got a new floor putting him and she is not completely thrilled with the way it looks.  We talked about her giving herself negative messages and she also has a tendency to give herself a hard time when she spends any money whatsoever and questions her judgment.  We talked about giving it some time and looking at a different way so that she can accept that it looks fine.  She seemed to be much better with things at the end of the session.  She also talked about being disappointed because she could not go to  [facility] in the spring like she usually does because her daughter changed her mind about that.  Interventions: Cognitive Behavioral Therapy and Assertiveness/Communication  Diagnosis:Major depression in remission (HCC)  Adjustment disorder with mixed anxiety and depressed mood  Plan: Treatment Plan  Strengths/Abilities:  Intelligent, insightful  Treatment Preferences:  Outpatient Individual therapy  Statement of Needs:  "I need a therapist to help me with my depression"  Symptoms:  decreased motivation: (Status: improved). poor concentration: (Status: improved). poor sleep: (Status: improved).  Problems Addressed:  Unipolar depression  Goals: 1. New Goal Statement for Unipolar Depression  Describe current and past experiences with depression including their impact on functioning and  attempts to resolve it. Target Date: 2024-09-20 Frequency: bi weekly Progress: 70 Modality: individual  2.Identify and replace thoughts and beliefs that support depression. Target Date: 2024-09-20 Frequency: bi weekly Progress: 80 Modality: individual  3.  Learn and implement behavioral strategies to overcome depression. Target Date: 2024-09-20 Frequency: bi weekly Progress: 80 Modality: individual  4.  Learn and implement problem-solving and decision-making skills. Target Date: 2024-09-20 Frequency: bi weekly Progress: 90 Modality: individual   Interventions by Therapist:  CBT , insight oriented approach and problem solving therapy.  Treatment plan created with patient and patient approves plan.  Enoc Getter G Betrice Wanat, LCSW

## 2023-09-29 ENCOUNTER — Ambulatory Visit: Payer: Medicare Other | Admitting: Psychology

## 2023-09-29 DIAGNOSIS — F325 Major depressive disorder, single episode, in full remission: Secondary | ICD-10-CM | POA: Diagnosis not present

## 2023-09-29 NOTE — Progress Notes (Signed)
 [facility] Counselor/Therapist Progress Note  Patient ID: Sylvia Maldonado, MRN: [id number],    Date: 09/29/2023  Time Spent:  51 minutes  Time in: 2:06 Time out:  2:57  Treatment Type: Individual Therapy  Reported Symptoms: depression  Mental Status Exam: Appearance:  Casual     Behavior: Appropriate  Motor: Normal  Speech/Language:  Normal Rate  Affect: Appropriate  Mood: pleasant  Thought process: normal  Thought content:   WNL  Sensory/Perceptual disturbances:   WNL  Orientation: oriented to person, place, time/date, and situation  Attention: Good  Concentration: Good  Memory: WNL  Fund of knowledge:  Good  Insight:   Good  Judgment:  Good  Impulse Control: Good   Risk Assessment: Danger to Self:  No Self-injurious Behavior: No Danger to Others: No Duty to Warn:no Physical Aggression / Violence:No  Access to Firearms a concern: No  Gang Involvement:No   Subjective: The patient attended an individual therapy session via video visit.  The patient gave verbal consent for the session to be the on video on caregility and is aware of the limitations of telehealth.  The patient was in her home alone and the therapist was in the office.  The patient presents with a flat affect and mood is pleasant.  The patient states that she is continuing to do her walking and that makes her happy.  She also has joined a [employer] and is a part of a new group which is good for her.  The patient reports that she is eating well and we talked about her upcoming trip in July and she is already buying clothes so that she can take those with her when she goes.  The patient seems to be managing her depression well right now and she is continuing to eat healthy as well.   Interventions: Cognitive Behavioral Therapy and Assertiveness/Communication  Diagnosis:Major depression in remission [facility])  Plan: Treatment Plan  Strengths/Abilities:  Intelligent, insightful  Treatment  Preferences:  Outpatient Individual therapy  Statement of Needs:  "I need a therapist to help me with my depression"  Symptoms:  decreased motivation: (Status: improved). poor concentration: (Status: improved). poor sleep: (Status: improved).  Problems Addressed:  Unipolar depression  Goals: 1. New Goal Statement for Unipolar Depression  Describe current and past experiences with depression including their impact on functioning and  attempts to resolve it. Target Date: 2024-09-20 Frequency: bi weekly Progress: 70 Modality: individual  2.Identify and replace thoughts and beliefs that support depression. Target Date: 2024-09-20 Frequency: bi weekly Progress: 80 Modality: individual  3.  Learn and implement behavioral strategies to overcome depression. Target Date: 2024-09-20 Frequency: bi weekly Progress: 80 Modality: individual  4.  Learn and implement problem-solving and decision-making skills. Target Date: 2024-09-20 Frequency: bi weekly Progress: 90 Modality: individual   Interventions by Therapist:  CBT , insight oriented approach and problem solving therapy.  Treatment plan created with patient and patient approves plan.  Kyshaun Barnette G Sadie Pickar, LCSW

## 2023-10-04 ENCOUNTER — Ambulatory Visit (INDEPENDENT_AMBULATORY_CARE_PROVIDER_SITE_OTHER): Payer: Medicare Other | Admitting: Podiatry

## 2023-10-04 ENCOUNTER — Encounter: Payer: Self-pay | Admitting: Podiatry

## 2023-10-04 DIAGNOSIS — M79675 Pain in left toe(s): Secondary | ICD-10-CM

## 2023-10-04 DIAGNOSIS — M79674 Pain in right toe(s): Secondary | ICD-10-CM

## 2023-10-04 DIAGNOSIS — B351 Tinea unguium: Secondary | ICD-10-CM

## 2023-10-04 NOTE — Progress Notes (Signed)
 Patient presents to the office saying her nails are not needful of nail care.  She wishes to reschedule in 4 weeks    GAM

## 2023-10-06 ENCOUNTER — Ambulatory Visit: Payer: Medicare Other | Admitting: Psychology

## 2023-10-06 DIAGNOSIS — F4323 Adjustment disorder with mixed anxiety and depressed mood: Secondary | ICD-10-CM | POA: Diagnosis not present

## 2023-10-06 DIAGNOSIS — F325 Major depressive disorder, single episode, in full remission: Secondary | ICD-10-CM | POA: Diagnosis not present

## 2023-10-06 NOTE — Progress Notes (Unsigned)
 [facility] Counselor/Therapist Progress Note  Patient ID: Sylvia Maldonado, MRN: [id number],    Date: 10/06/2023  Time Spent:  42 minutes  Time in: 12:05 Time out:  12:47  Treatment Type: Individual Therapy  Reported Symptoms: depression  Mental Status Exam: Appearance:  Casual     Behavior: Appropriate  Motor: Normal  Speech/Language:  Normal Rate  Affect: Appropriate  Mood: pleasant  Thought process: normal  Thought content:   WNL  Sensory/Perceptual disturbances:   WNL  Orientation: oriented to person, place, time/date, and situation  Attention: Good  Concentration: Good  Memory: WNL  Fund of knowledge:  Good  Insight:   Good  Judgment:  Good  Impulse Control: Good   Risk Assessment: Danger to Self:  No Self-injurious Behavior: No Danger to Others: No Duty to Warn:no Physical Aggression / Violence:No  Access to Firearms a concern: No  Gang Involvement:No   Subjective: The patient attended an individual therapy session via video visit.  The patient gave verbal consent for the session to be the on video on caregility and is aware of the limitations of telehealth.  The patient was in her home alone and the therapist was in the office.  The patient presents with a flat affect and mood is pleasant.  The patient reports that she had to decrease her walking because she was walking more than 2 hours a day and she has noticed some joint pain.  We talked about part of her walking process being a social activity for her and I encouraged her to continue to walk with her friend that goes very slow in order to be able to have more social interaction.  In addition we talked about the protein that she is getting and it seems that she is not getting very much protein in her diet because she is vegetarian.  We talked about protein shakes and that she could look for organic protein shakes at the store since she eats all organic.  The patient seems to be doing okay as far  as her mood goes she generally does better in the summer and spring that she does in the fall and winter.  Encouraged her to continue to take care of herself and see if she could find alternative sources of protein for her diet.    Interventions: Cognitive Behavioral Therapy and Assertiveness/Communication  Diagnosis:Major depression in remission (HCC)  Adjustment disorder with mixed anxiety and depressed mood  Plan: Treatment Plan  Strengths/Abilities:  Intelligent, insightful  Treatment Preferences:  Outpatient Individual therapy  Statement of Needs:  "I need a therapist to help me with my depression"  Symptoms:  decreased motivation: (Status: improved). poor concentration: (Status: improved). poor sleep: (Status: improved).  Problems Addressed:  Unipolar depression  Goals: 1. New Goal Statement for Unipolar Depression  Describe current and past experiences with depression including their impact on functioning and  attempts to resolve it. Target Date: 2024-09-20 Frequency: bi weekly Progress: 70 Modality: individual  2.Identify and replace thoughts and beliefs that support depression. Target Date: 2024-09-20 Frequency: bi weekly Progress: 80 Modality: individual  3.  Learn and implement behavioral strategies to overcome depression. Target Date: 2024-09-20 Frequency: bi weekly Progress: 80 Modality: individual  4.  Learn and implement problem-solving and decision-making skills. Target Date: 2024-09-20 Frequency: bi weekly Progress: 90 Modality: individual   Interventions by Therapist:  CBT , insight oriented approach and problem solving therapy.  Treatment plan created with patient and patient approves plan.  Makinzy Cleere G Daci Stubbe,  LCSW

## 2023-10-18 DIAGNOSIS — Z01419 Encounter for gynecological examination (general) (routine) without abnormal findings: Secondary | ICD-10-CM | POA: Diagnosis not present

## 2023-10-18 DIAGNOSIS — Z1231 Encounter for screening mammogram for malignant neoplasm of breast: Secondary | ICD-10-CM | POA: Diagnosis not present

## 2023-11-01 DIAGNOSIS — H40013 Open angle with borderline findings, low risk, bilateral: Secondary | ICD-10-CM | POA: Diagnosis not present

## 2023-11-04 ENCOUNTER — Ambulatory Visit (INDEPENDENT_AMBULATORY_CARE_PROVIDER_SITE_OTHER): Payer: Medicare Other | Admitting: Psychology

## 2023-11-04 DIAGNOSIS — F325 Major depressive disorder, single episode, in full remission: Secondary | ICD-10-CM

## 2023-11-04 NOTE — Progress Notes (Signed)
 [facility] Counselor/Therapist Progress Note  Patient ID: Sylvia Maldonado, MRN: [id number],    Date: 11/04/2023  Time Spent: 54 minutes  Time in: 12:06 Time out:  12:55  Treatment Type: Individual Therapy  Reported Symptoms: depression  Mental Status Exam: Appearance:  Casual     Behavior: Appropriate  Motor: Normal  Speech/Language:  Normal Rate  Affect: Appropriate  Mood: pleasant  Thought process: normal  Thought content:   WNL  Sensory/Perceptual disturbances:   WNL  Orientation: oriented to person, place, time/date, and situation  Attention: Good  Concentration: Good  Memory: WNL  Fund of knowledge:  Good  Insight:   Good  Judgment:  Good  Impulse Control: Good   Risk Assessment: Danger to Self:  No Self-injurious Behavior: No Danger to Others: No Duty to Warn:no Physical Aggression / Violence:No  Access to Firearms a concern: No  Gang Involvement:No   Subjective: The patient attended an individual therapy session via video visit.  The patient gave verbal consent for the session to be the on video on caregility and is aware of the limitations of telehealth.  The patient was in her home alone and the therapist was in the office.  The patient presents with a flat affect and mood is pleasant.  The patient reports that she continues to walk just an hour and a half a day instead of several hours.  She reports that her joints are definitely causing issues for her.  We talked about the possibility of her going to a doctor about that this but she is not quite ready to do that yet.  In addition she states that she is eating healthfully and she is doing some protein shakes which is good because she was not eating enough protein previously.  We talked about things that were going on in her life and she seems to be managing things well.  She feels much better now that her medications back to the way he had it before and she is back on the Abilify .  We will  continue to monitor her progress and she reports that she seems to be doing okay right now.  Interventions: Cognitive Behavioral Therapy and Assertiveness/Communication  Diagnosis:Major depression in remission [facility])  Plan: Treatment Plan  Strengths/Abilities:  Intelligent, insightful  Treatment Preferences:  Outpatient Individual therapy  Statement of Needs:  "I need a therapist to help me with my depression"  Symptoms:  decreased motivation: (Status: improved). poor concentration: (Status: improved). poor sleep: (Status: improved).  Problems Addressed:  Unipolar depression  Goals: 1. New Goal Statement for Unipolar Depression  Describe current and past experiences with depression including their impact on functioning and  attempts to resolve it. Target Date: 2024-09-20 Frequency: bi weekly Progress: 70 Modality: individual  2.Identify and replace thoughts and beliefs that support depression. Target Date: 2024-09-20 Frequency: bi weekly Progress: 80 Modality: individual  3.  Learn and implement behavioral strategies to overcome depression. Target Date: 2024-09-20 Frequency: bi weekly Progress: 80 Modality: individual  4.  Learn and implement problem-solving and decision-making skills. Target Date: 2024-09-20 Frequency: bi weekly Progress: 90 Modality: individual   Interventions by Therapist:  CBT , insight oriented approach and problem solving therapy.  Treatment plan created with patient and patient approves plan.  Sylvia Maldonado G Sylvia Lafave, LCSW

## 2023-11-09 DIAGNOSIS — E7800 Pure hypercholesterolemia, unspecified: Secondary | ICD-10-CM | POA: Diagnosis not present

## 2023-11-10 ENCOUNTER — Ambulatory Visit: Payer: Medicare Other | Admitting: Psychology

## 2023-11-15 ENCOUNTER — Ambulatory Visit (INDEPENDENT_AMBULATORY_CARE_PROVIDER_SITE_OTHER): Admitting: Podiatry

## 2023-11-15 ENCOUNTER — Encounter: Payer: Self-pay | Admitting: Podiatry

## 2023-11-15 DIAGNOSIS — L84 Corns and callosities: Secondary | ICD-10-CM

## 2023-11-15 DIAGNOSIS — M79674 Pain in right toe(s): Secondary | ICD-10-CM

## 2023-11-15 DIAGNOSIS — B351 Tinea unguium: Secondary | ICD-10-CM

## 2023-11-15 DIAGNOSIS — M79675 Pain in left toe(s): Secondary | ICD-10-CM | POA: Diagnosis not present

## 2023-11-15 DIAGNOSIS — M216X1 Other acquired deformities of right foot: Secondary | ICD-10-CM

## 2023-11-15 NOTE — Progress Notes (Signed)
 This patient returns to the office for evaluation and treatment of long thick painful nails .  This patient is unable to trim her own nails since the patient cannot reach her feet.  Patient says the nails are painful walking and wearing his shoes.  She returns for preventive foot care services.  General Appearance  Alert, conversant and in no acute stress.  Vascular  Dorsalis pedis and posterior tibial  pulses are palpable  bilaterally.  Capillary return is within normal limits  bilaterally. Temperature is within normal limits  bilaterally.  Neurologic  Senn-Weinstein monofilament wire test within normal limits  bilaterally. Muscle power within normal limits bilaterally.  Nails Thick disfigured discolored nails with subungual debris  from hallux to fifth toes bilaterally. No evidence of bacterial infection or drainage bilaterally.  Orthopedic  No limitations of motion  feet .  No crepitus or effusions noted.   HAV 1st MPJ  B/L.  Hammer toes 2-5  B/L. Plantar flexed fifth met  B/L.  Skin  normotropic skin with no porokeratosis noted bilaterally.  No signs of infections or ulcers noted.   Callus subfifth met  right foot.  Onychomycosis  Pain in toes right foot  Pain in toes left foot   Debridement  of nails  1-5  B/L with a nail nipper.  Nails were then filed using a dremel tool with no incidents.  Debride callus sub 5th met right foot.  RTC 10 weeks    Ruffin Cotton DPM

## 2023-11-24 ENCOUNTER — Ambulatory Visit (INDEPENDENT_AMBULATORY_CARE_PROVIDER_SITE_OTHER): Payer: Medicare Other | Admitting: Psychology

## 2023-11-24 DIAGNOSIS — F325 Major depressive disorder, single episode, in full remission: Secondary | ICD-10-CM | POA: Diagnosis not present

## 2023-11-24 DIAGNOSIS — F4323 Adjustment disorder with mixed anxiety and depressed mood: Secondary | ICD-10-CM | POA: Diagnosis not present

## 2023-11-24 NOTE — Progress Notes (Signed)
 [facility] Counselor/Therapist Progress Note  Patient ID: Sylvia Maldonado, MRN: [id number],    Date: 11/24/2023  Time Spent:  51 minutes  Time in: 2:02 Time out:  2:53  Treatment Type: Individual Therapy  Reported Symptoms: depression  Mental Status Exam: Appearance:  Casual     Behavior: Appropriate  Motor: Normal  Speech/Language:  Normal Rate  Affect: Appropriate  Mood: pleasant  Thought process: normal  Thought content:   WNL  Sensory/Perceptual disturbances:   WNL  Orientation: oriented to person, place, time/date, and situation  Attention: Good  Concentration: Good  Memory: WNL  Fund of knowledge:  Good  Insight:   Good  Judgment:  Good  Impulse Control: Good   Risk Assessment: Danger to Self:  No Self-injurious Behavior: No Danger to Others: No Duty to Warn:no Physical Aggression / Violence:No  Access to Firearms a concern: No  Gang Involvement:No   Subjective: The patient attended an individual therapy session via video visit.  The patient gave verbal consent for the session to be the on video on caregility and is aware of the limitations of telehealth.  The patient was in her home alone and the therapist was in the office.  The patient presents with a flat affect and mood is pleasant.  The patient reports that she is doing well.  She had 3 questions to ask and some of them had to do with advertisements that she is saying about Medicare products and also about car insurance.  We did some problem solving and I recommended that she do some calling to get quotes if she thought she wanted to change something about the Medicare plan that she has now or about her insurance.  In addition we talked about her joint pain and I recommended that she speak with her primary care physician and see if there is anything natural that they could prescribe for her that might help her with her joint pain as she does not want to have traditional medical procedures.  The  patient reports that she feels like she is doing well and she is walking an hour and a half a day plus going to exercise class III days a week.    Interventions: Cognitive Behavioral Therapy and Assertiveness/Communication, problem solving  Diagnosis:Major depression in remission (HCC)  Adjustment disorder with mixed anxiety and depressed mood  Plan: Treatment Plan  Strengths/Abilities:  Intelligent, insightful  Treatment Preferences:  Outpatient Individual therapy  Statement of Needs:  "I need a therapist to help me with my depression"  Symptoms:  decreased motivation: (Status: improved). poor concentration: (Status: improved). poor sleep: (Status: improved).  Problems Addressed:  Unipolar depression  Goals: 1. New Goal Statement for Unipolar Depression  Describe current and past experiences with depression including their impact on functioning and  attempts to resolve it. Target Date: 2024-09-20 Frequency: bi weekly Progress: 70 Modality: individual  2.Identify and replace thoughts and beliefs that support depression. Target Date: 2024-09-20 Frequency: bi weekly Progress: 80 Modality: individual  3.  Learn and implement behavioral strategies to overcome depression. Target Date: 2024-09-20 Frequency: bi weekly Progress: 80 Modality: individual  4.  Learn and implement problem-solving and decision-making skills. Target Date: 2024-09-20 Frequency: bi weekly Progress: 90 Modality: individual   Interventions by Therapist:  CBT , insight oriented approach and problem solving therapy.  Treatment plan created with patient and patient approves plan.  Tamsyn Owusu G Seng Fouts, LCSW

## 2023-11-30 DIAGNOSIS — F339 Major depressive disorder, recurrent, unspecified: Secondary | ICD-10-CM | POA: Diagnosis not present

## 2023-11-30 DIAGNOSIS — Z0000 Encounter for general adult medical examination without abnormal findings: Secondary | ICD-10-CM | POA: Diagnosis not present

## 2023-11-30 DIAGNOSIS — E7800 Pure hypercholesterolemia, unspecified: Secondary | ICD-10-CM | POA: Diagnosis not present

## 2023-11-30 DIAGNOSIS — Z23 Encounter for immunization: Secondary | ICD-10-CM | POA: Diagnosis not present

## 2023-11-30 DIAGNOSIS — Z6822 Body mass index (BMI) 22.0-22.9, adult: Secondary | ICD-10-CM | POA: Diagnosis not present

## 2023-11-30 DIAGNOSIS — M8588 Other specified disorders of bone density and structure, other site: Secondary | ICD-10-CM | POA: Diagnosis not present

## 2023-11-30 DIAGNOSIS — I2510 Atherosclerotic heart disease of native coronary artery without angina pectoris: Secondary | ICD-10-CM | POA: Diagnosis not present

## 2023-12-08 ENCOUNTER — Ambulatory Visit: Payer: Medicare Other | Admitting: Psychology

## 2023-12-08 DIAGNOSIS — F325 Major depressive disorder, single episode, in full remission: Secondary | ICD-10-CM

## 2023-12-08 DIAGNOSIS — F4323 Adjustment disorder with mixed anxiety and depressed mood: Secondary | ICD-10-CM | POA: Diagnosis not present

## 2023-12-08 NOTE — Progress Notes (Unsigned)
 [facility] Counselor/Therapist Progress Note  Patient ID: Sylvia Maldonado, MRN: [id number],    Date: 12/08/2023  Time Spent:  50 minutes  Time in: 2:06 Time out:  2:56  Treatment Type: Individual Therapy  Reported Symptoms: depression  Mental Status Exam: Appearance:  Casual     Behavior: Appropriate  Motor: Normal  Speech/Language:  Normal Rate  Affect: Appropriate  Mood: pleasant  Thought process: normal  Thought content:   WNL  Sensory/Perceptual disturbances:   WNL  Orientation: oriented to person, place, time/date, and situation  Attention: Good  Concentration: Good  Memory: WNL  Fund of knowledge:  Good  Insight:   Good  Judgment:  Good  Impulse Control: Good   Risk Assessment: Danger to Self:  No Self-injurious Behavior: No Danger to Others: No Duty to Warn:no Physical Aggression / Violence:No  Access to Firearms a concern: No  Gang Involvement:No   Subjective: The patient attended an individual therapy session via video visit.  The patient gave verbal consent for the session to be the on video on caregility and is aware of the limitations of telehealth.  The patient was in her home alone and the therapist was in the office.  The patient presents with a flat affect and mood is pleasant.  The patient reports that things are going well.  She continues to walk on a regular basis and she reports that her cholesterol is down.  She states that she is doing things [facility] and meeting up with her friends and walking.  She did say that her doctor said something about her daughter was not attending to her like she needed to and we talked about whether she felt her daughter was not attending to her like she needed to do and she said that she felt like they were doing just fine.  The patient is going to see her other daughter and new great-grandchildren in July and she is looking forward to that.  She did not have anything specific that she  wanted to discuss today I provided supportive therapy to her today and she feels that she can needs to continue the session because it helps keep her stable.   Interventions: Cognitive Behavioral Therapy and Assertiveness/Communication, problem solving  Diagnosis:Major depression in remission (HCC)  Adjustment disorder with mixed anxiety and depressed mood  Plan: Treatment Plan  Strengths/Abilities:  Intelligent, insightful  Treatment Preferences:  Outpatient Individual therapy  Statement of Needs:  "I need a therapist to help me with my depression"  Symptoms:  decreased motivation: (Status: improved). poor concentration: (Status: improved). poor sleep: (Status: improved).  Problems Addressed:  Unipolar depression  Goals: 1. New Goal Statement for Unipolar Depression  Describe current and past experiences with depression including their impact on functioning and  attempts to resolve it. Target Date: 2024-09-20 Frequency: bi weekly Progress: 70 Modality: individual  2.Identify and replace thoughts and beliefs that support depression. Target Date: 2024-09-20 Frequency: bi weekly Progress: 80 Modality: individual  3.  Learn and implement behavioral strategies to overcome depression. Target Date: 2024-09-20 Frequency: bi weekly Progress: 80 Modality: individual  4.  Learn and implement problem-solving and decision-making skills. Target Date: 2024-09-20 Frequency: bi weekly Progress: 90 Modality: individual   Interventions by Therapist:  CBT , insight oriented approach and problem solving therapy.  Treatment plan created with patient and patient approves plan.  Adonna Horsley G Jalisa Sacco, LCSW

## 2023-12-10 DIAGNOSIS — E7800 Pure hypercholesterolemia, unspecified: Secondary | ICD-10-CM | POA: Diagnosis not present

## 2023-12-22 ENCOUNTER — Ambulatory Visit (INDEPENDENT_AMBULATORY_CARE_PROVIDER_SITE_OTHER): Admitting: Psychology

## 2023-12-22 DIAGNOSIS — F4323 Adjustment disorder with mixed anxiety and depressed mood: Secondary | ICD-10-CM

## 2023-12-22 DIAGNOSIS — F325 Major depressive disorder, single episode, in full remission: Secondary | ICD-10-CM | POA: Diagnosis not present

## 2023-12-22 NOTE — Progress Notes (Signed)
  [facility] Counselor/Therapist Progress Note  Patient ID: Kafi Dotter, MRN: [id number],    Date: 12/22/2023  Time Spent:  53 minutes  Time in: 2:07 Time out:  2:54  Treatment Type: Individual Therapy  Reported Symptoms: depression  Mental Status Exam: Appearance:  Casual     Behavior: Appropriate  Motor: Normal  Speech/Language:  Normal Rate  Affect: Appropriate  Mood: pleasant  Thought process: Anxious and agitated  Thought content:   WNL  Sensory/Perceptual disturbances:   WNL  Orientation: oriented to person, place, time/date, and situation  Attention: Good  Concentration: Good  Memory: WNL  Fund of knowledge:  Good  Insight:   Good  Judgment:  Good  Impulse Control: Good   Risk Assessment: Danger to Self:  No Self-injurious Behavior: No Danger to Others: No Duty to Warn:no Physical Aggression / Violence:No  Access to Firearms a concern: No  Gang Involvement:No   Subjective: The patient attended an individual therapy session via video visit.  The patient gave verbal consent for the session to be the on video on caregility and is aware of the limitations of telehealth.  The patient was in her home alone and the therapist was in the office.  The patient presents with a flat affect and mood anxious and agitated.  The patient reports that she went to a book club and the leader left and after the leader left the women started talking about going to another friend's house and using drugs.  This made the patient very uncomfortable.  The patient reports that she has had insomnia and she has been worried since that happened and she was not sure how to handle the situation.  I explained to her that she would either have to accept the circumstance or choose to not go back to the book club.  The patient had some sort of idea that it would be okay for the leader of the group to ask the one lady that was talking about using drugs at her house to leave the group.   I explained to the patient that that was not something that the group leader could do because it was a group that was formed in their condo complex and they all live there.  I did say that the leader could pull her aside and help conversation with her but she could not really ask her to leave the group and I think that the solution that the patient wanted.  We talked about how to say that she was going to choose to leave the group and because the leader is one of the people she walks with she actually is going to also not walk with that person again.  I explained that her behavior was the only behavior that anyone could control and that environment and that she would have to make a decision to to leave and not deal with it.  Interventions: Cognitive Behavioral Therapy and Assertiveness/Communication, problem solving  Diagnosis:Major depression in remission (HCC)  Adjustment disorder with mixed anxiety and depressed mood  Plan: Treatment Plan  Strengths/Abilities:  Intelligent, insightful  Treatment Preferences:  Outpatient Individual therapy  Statement of Needs:  I need a therapist to help me with my depression  Symptoms:  decreased motivation: (Status: improved). poor concentration: (Status: improved). poor sleep: (Status: improved).  Problems Addressed:  Unipolar depression  Goals: 1. New Goal Statement for Unipolar Depression  Describe current and past experiences with depression including their impact on functioning and  attempts to resolve  it. Target Date: 2024-09-20 Frequency: bi weekly Progress: 70 Modality: individual  2.Identify and replace thoughts and beliefs that support depression. Target Date: 2024-09-20 Frequency: bi weekly Progress: 80 Modality: individual  3.  Learn and implement behavioral strategies to overcome depression. Target Date: 2024-09-20 Frequency: bi weekly Progress: 80 Modality: individual  4.  Learn and implement problem-solving and decision-making  skills. Target Date: 2024-09-20 Frequency: bi weekly Progress: 90 Modality: individual   Interventions by Therapist:  CBT , insight oriented approach and problem solving therapy.  Treatment plan created with patient and patient approves plan.  Keashia Haskins G Ellean Firman, LCSW

## 2024-01-03 ENCOUNTER — Emergency Department (HOSPITAL_BASED_OUTPATIENT_CLINIC_OR_DEPARTMENT_OTHER): Admission: EM | Admit: 2024-01-03 | Discharge: 2024-01-03 | Disposition: A | Discharge status: Home / Self Care

## 2024-01-03 ENCOUNTER — Other Ambulatory Visit: Payer: Self-pay

## 2024-01-03 ENCOUNTER — Encounter (HOSPITAL_BASED_OUTPATIENT_CLINIC_OR_DEPARTMENT_OTHER): Payer: Self-pay | Admitting: Emergency Medicine

## 2024-01-03 ENCOUNTER — Emergency Department (HOSPITAL_BASED_OUTPATIENT_CLINIC_OR_DEPARTMENT_OTHER)

## 2024-01-03 DIAGNOSIS — R519 Headache, unspecified: Secondary | ICD-10-CM | POA: Insufficient documentation

## 2024-01-03 DIAGNOSIS — Z91040 Latex allergy status: Secondary | ICD-10-CM | POA: Insufficient documentation

## 2024-01-03 DIAGNOSIS — Z79899 Other long term (current) drug therapy: Secondary | ICD-10-CM | POA: Diagnosis not present

## 2024-01-03 DIAGNOSIS — R42 Dizziness and giddiness: Secondary | ICD-10-CM | POA: Insufficient documentation

## 2024-01-03 DIAGNOSIS — R9082 White matter disease, unspecified: Secondary | ICD-10-CM | POA: Diagnosis not present

## 2024-01-03 LAB — COMPREHENSIVE METABOLIC PANEL WITH GFR
ALT: 16 U/L (ref 0–44)
AST: 28 U/L (ref 15–41)
Albumin: 4.3 g/dL (ref 3.5–5.0)
Alkaline Phosphatase: 78 U/L (ref 38–126)
Anion gap: 10 (ref 5–15)
BUN: 16 mg/dL (ref 8–23)
CO2: 25 mmol/L (ref 22–32)
Calcium: 9.4 mg/dL (ref 8.9–10.3)
Chloride: 103 mmol/L (ref 98–111)
Creatinine, Ser: 0.53 mg/dL (ref 0.44–1.00)
GFR, Estimated: >60 mL/min (ref >60)
Glucose, Bld: 95 mg/dL (ref 70–99)
Potassium: 4.4 mmol/L (ref 3.5–5.1)
Sodium: 138 mmol/L (ref 135–145)
Total Bilirubin: 0.8 mg/dL (ref 0.0–1.2)
Total Protein: 6.6 g/dL (ref 6.5–8.1)

## 2024-01-03 LAB — URINALYSIS, ROUTINE W REFLEX MICROSCOPIC
Bacteria, UA: NONE SEEN
Bilirubin Urine: NEGATIVE
Glucose, UA: NEGATIVE mg/dL
Hgb urine dipstick: NEGATIVE
Ketones, ur: NEGATIVE mg/dL
Leukocytes,Ua: NEGATIVE
Nitrite: NEGATIVE
Protein, ur: NEGATIVE mg/dL
Specific Gravity, Urine: <1.005 — ABNORMAL LOW (ref 1.005–1.030)
pH: 6 (ref 5.0–8.0)

## 2024-01-03 LAB — CBC WITH DIFFERENTIAL/PLATELET
Abs Immature Granulocytes: 0.01 10*3/uL (ref 0.00–0.07)
Basophils Absolute: 0 10*3/uL (ref 0.0–0.1)
Basophils Relative: 1 %
Eosinophils Absolute: 0.1 10*3/uL (ref 0.0–0.5)
Eosinophils Relative: 2 %
HCT: 38 % (ref 36.0–46.0)
Hemoglobin: 12.4 g/dL (ref 12.0–15.0)
Immature Granulocytes: 0 %
Lymphocytes Relative: 25 %
Lymphs Abs: 0.9 10*3/uL (ref 0.7–4.0)
MCH: 28.8 pg (ref 26.0–34.0)
MCHC: 32.6 g/dL (ref 30.0–36.0)
MCV: 88.4 fL (ref 80.0–100.0)
Monocytes Absolute: 0.4 10*3/uL (ref 0.1–1.0)
Monocytes Relative: 11 %
Neutro Abs: 2.3 10*3/uL (ref 1.7–7.7)
Neutrophils Relative %: 61 %
Platelets: 159 10*3/uL (ref 150–400)
RBC: 4.3 MIL/uL (ref 3.87–5.11)
RDW: 13.5 % (ref 11.5–15.5)
WBC: 3.7 10*3/uL — ABNORMAL LOW (ref 4.0–10.5)
nRBC: 0 % (ref 0.0–0.2)

## 2024-01-03 LAB — TSH: TSH: 1.59 u[IU]/mL (ref 0.350–4.500)

## 2024-01-03 LAB — MAGNESIUM: Magnesium: 2.4 mg/dL (ref 1.7–2.4)

## 2024-01-03 MED ORDER — IBUPROFEN 400 MG PO TABS
400.0000 mg | ORAL_TABLET | Freq: Once | ORAL | Status: AC
  Administered 2024-01-03: 400 mg via ORAL
  Filled 2024-01-03: qty 1

## 2024-01-03 MED ORDER — MECLIZINE HCL 25 MG PO TABS: 12.5000 mg | ORAL_TABLET | Freq: Once | ORAL | Status: DC

## 2024-01-03 MED ORDER — SODIUM CHLORIDE 0.9 % IV BOLUS
250.0000 mL | Freq: Once | INTRAVENOUS | Status: AC
  Administered 2024-01-03: 250 mL via INTRAVENOUS

## 2024-01-03 MED ORDER — KETOROLAC TROMETHAMINE 15 MG/ML IJ SOLN: 15.0000 mg | Freq: Once | INTRAMUSCULAR | Status: DC

## 2024-01-03 NOTE — ED Notes (Signed)
 Pt walked to bathroom w/o assistance, complaining of dizziness the whole time.

## 2024-01-03 NOTE — ED Provider Notes (Signed)
 [place] EMERGENCY DEPARTMENT AT [facility] Provider Note   CSN: [id number] Arrival date & time: 01/03/24  9093     Patient presents with: Dizziness   Sylvia Maldonado is a 83 y.o. female.    Dizziness  Presents with dizziness.  According the patient, she is been feeling lightheaded over the past day or so.  She endorses a mild headache as well.  No recent falls.  No fever no chills.  No vision changes.  No numbness or tingling anywhere.  She states that she just feels lightheaded.  She is not sure whether or not anything makes it better or worse.  She does not think standing up makes it worse.  No difficulty walking.  No near syncopal episodes.  No speech changes.  No facial changes that her or her son is aware of.  Otherwise, acting at baseline.  Otherwise denies all complaints.  No chest pain shortness of breath.  No nausea vomit diarrhea.  No fever no chills.    Prior to Admission medications   Medication Sig Start Date End Date Taking? Authorizing Provider  atorvastatin (LIPITOR) 10 MG tablet Take 10 mg by mouth once a week. *Thursdays* 09/16/22  Yes [provider]  ARIPiprazole  (ABILIFY ) 10 MG tablet 1  qam 08/30/23   Plovsky, Elna, MD  Cholecalciferol (VITAMIN D-3) 1000 UNITS CAPS Take 2,000 Units by mouth daily.    [provider]  citalopram  (CELEXA ) 20 MG tablet Take 1 tablet (20 mg total) by mouth daily. 08/30/23   Plovsky, Elna, MD  cycloSPORINE  (RESTASIS ) 0.05 % ophthalmic emulsion Place 1 drop into both eyes daily.     [provider]  Magnesium 200 MG TABS 1 tablet with a meal    [provider]  [facility] injection  06/11/20   [provider]  valbenazine  (INGREZZA ) 60 MG capsule Take 1 capsule (60 mg total) by mouth daily. 08/30/23   Plovsky, Elna, MD  vitamin B-12 (CYANOCOBALAMIN ) 1000 MCG tablet Take 1,000 mcg by mouth daily.    [provider]    Allergies: Doxycycline, Neosporin  [neomycin-bacitracin zn-polymyx], Penicillins, Sulfa antibiotics, Latex, Prednisone, Acetaminophen , Amprenavir, Aripiprazole , Bupropion , Darunavir, Egg-derived products, Sulfur, Venlafaxine , and Zoster vac recomb adjuvanted    Review of Systems  Neurological:  Positive for dizziness.    Updated Vital Signs BP (!) 176/72   Pulse 68   Temp 98.9 F (37.2 C) (Oral)   Resp 18   Ht 5' 2 (1.575 m)   Wt 59 kg   SpO2 99%   BMI 23.78 kg/m   Physical Exam Vitals and nursing note reviewed.  Constitutional:      General: She is not in acute distress.    Appearance: She is well-developed.  HENT:     Head: Normocephalic and atraumatic.   Eyes:     General: No visual field deficit.    Conjunctiva/sclera: Conjunctivae normal.    Cardiovascular:     Rate and Rhythm: Normal rate and regular rhythm.     Heart sounds: No murmur heard. Pulmonary:     Effort: Pulmonary effort is normal. No respiratory distress.     Breath sounds: Normal breath sounds.  Abdominal:     Palpations: Abdomen is soft.     Tenderness: There is no abdominal tenderness.   Musculoskeletal:        General: No swelling.     Cervical back: Neck supple.   Skin:    General: Skin is warm and dry.  Capillary Refill: Capillary refill takes less than 2 seconds.   Neurological:     Mental Status: She is alert and oriented to person, place, and time.     GCS: GCS eye subscore is 4. GCS verbal subscore is 5. GCS motor subscore is 6.     Cranial Nerves: Cranial nerves 2-12 are intact. No cranial nerve deficit, dysarthria or facial asymmetry.     Sensory: Sensation is intact.     Motor: Motor function is intact. No weakness, abnormal muscle tone or pronator drift.     Coordination: Coordination is intact. Romberg sign negative. Finger-Nose-Finger Test normal.     Gait: Gait is intact. Gait normal.     Comments: Corrective horizontal saccade and horizontal nystagmus.   Psychiatric:        Mood and Affect: Mood normal.      (all labs ordered are listed, but only abnormal results are displayed) Labs Reviewed  CBC WITH DIFFERENTIAL/PLATELET - Abnormal; Notable for the following components:      Result Value   WBC 3.7 (*)    All other components within normal limits  URINALYSIS, ROUTINE W REFLEX MICROSCOPIC - Abnormal; Notable for the following components:   Color, Urine COLORLESS (*)    Specific Gravity, Urine <1.005 (*)    All other components within normal limits  COMPREHENSIVE METABOLIC PANEL WITH GFR  MAGNESIUM  TSH    EKG: EKG Interpretation Date/Time:  Tuesday January 03 2024 09:20:57 EDT Ventricular Rate:  65 PR Interval:    QRS Duration:  115 QT Interval:  429 QTC Calculation: 447 R Axis:   -48  Text Interpretation: Left anterior fascicular block Left ventricular hypertrophy Premature atrial complexes Confirmed by Simon Rea [id number]) on 01/03/2024 9:45:09 AM  Radiology: CT Head Wo Contrast Result Date: 01/03/2024 CLINICAL DATA:  Vertigo. EXAM: CT HEAD WITHOUT CONTRAST TECHNIQUE: Contiguous axial images were obtained from the base of the skull through the vertex without intravenous contrast. RADIATION DOSE REDUCTION: This exam was performed according to the departmental dose-optimization program which includes automated exposure control, adjustment of the mA and/or kV according to patient size and/or use of iterative reconstruction technique. COMPARISON:  04/28/2023 FINDINGS: Brain: No acute intracranial hemorrhage. No focal mass lesion. No CT evidence of acute infarction. No midline shift or mass effect. No hydrocephalus. Basilar cisterns are patent. There are minimal periventricular and subcortical white matter hypodensities. Mild generalized cortical atrophy. Vascular: No hyperdense vessel or unexpected calcification. Skull: Normal. Negative for fracture or focal lesion. Sinuses/Orbits: Paranasal sinuses and mastoid air cells are clear. Orbits are clear. Other: None. IMPRESSION: 1. No acute  intracranial findings. 2. Mild atrophy and white matter microvascular disease. Electronically Signed   By: Jackquline Boxer M.D.   On: 01/03/2024 10:55     Procedures   Medications Ordered in the ED  sodium chloride  0.9 % bolus 250 mL (250 mLs Intravenous New Bag/Given 01/03/24 1141)  ibuprofen (ADVIL) tablet 400 mg (400 mg Oral Given 01/03/24 1138)                                    Medical Decision Making Amount and/or Complexity of Data Reviewed Labs: ordered. Radiology: ordered.  Risk Prescription drug management.   Presents with dizziness.  According the patient, she is been feeling lightheaded over the past day or so.  She endorses a mild headache as well.  No recent falls.  No fever no chills.  No vision changes.  No numbness or tingling anywhere.  She states that she just feels lightheaded.  She is not sure whether or not anything makes it better or worse.  She does not think standing up makes it worse.  No difficulty walking.  No near syncopal episodes.  No speech changes.  No facial changes that her or her son is aware of.  Otherwise, acting at baseline.  Otherwise denies all complaints.  No chest pain shortness of breath.  No nausea vomit diarrhea.  No fever no chills.  Upon exam, patient neurologically intact.  ANO x 3 GCS 15.  NIH is 0.  Cranials 2 through 12 intact.  Positive horizontal nystagmus no vertical rotary.  Normal finger-nose.  Romberg negative.  NIH is 0.  Did obtain CT head to rule out any kind of intracranial pathology.  CT head was unremarkable.  Given this, I did give patient a small dose of ibuprofen as well as 250 cc of fluid.  She states that her headache as well as her vertigo has subsequently resolved.  She has no ongoing symptoms.  NIH remains 0.  Given this, no MRI is needed this point time to workup posterior CVA.  She has been seen for similar presentation in the past.  Recommended aggressive p.o. intake at home of fluids.  Initial EKG showed some  possible PACs.  Patient was on cardiac telemetry.  Normal sinus rhythm throughout the ED stay.  Discharge stable condition.          Final diagnoses:  Vertigo  Nonintractable headache, unspecified chronicity pattern, unspecified headache type    ED Discharge Orders     None          Simon Lavonia SAILOR, MD 01/03/24 1209

## 2024-01-03 NOTE — ED Triage Notes (Signed)
 Pt caox4, ambulatory c/o feeling constantly  light headed with headache x2 days. Pt denies any recent trauma to the head.

## 2024-01-03 NOTE — Discharge Instructions (Addendum)
 If you continue to have a headache it is okay to take 400 mg ibuprofen every 6-8 hours as needed.  He can do this for the next day or 2 but do not want to take it longer than this due to side effects.  Your workup today was unremarkable.  No new changes in your labs compared to prior.  Your CT imaging of your head was unremarkable as well.   Please stay well hydrated.

## 2024-01-05 ENCOUNTER — Ambulatory Visit: Admitting: Psychology

## 2024-01-05 DIAGNOSIS — F325 Major depressive disorder, single episode, in full remission: Secondary | ICD-10-CM

## 2024-01-05 DIAGNOSIS — Z6823 Body mass index (BMI) 23.0-23.9, adult: Secondary | ICD-10-CM | POA: Diagnosis not present

## 2024-01-05 DIAGNOSIS — F4323 Adjustment disorder with mixed anxiety and depressed mood: Secondary | ICD-10-CM

## 2024-01-05 DIAGNOSIS — H6122 Impacted cerumen, left ear: Secondary | ICD-10-CM | POA: Diagnosis not present

## 2024-01-05 DIAGNOSIS — R519 Headache, unspecified: Secondary | ICD-10-CM | POA: Diagnosis not present

## 2024-01-05 NOTE — Progress Notes (Signed)
 [facility] Counselor/Therapist Progress Note  Patient ID: Sylvia Maldonado, MRN: [id number],    Date: 01/05/2024  Time Spent:  55 minutes  Time in: 2:05 Time out:  3:00  Treatment Type: Individual Therapy  Reported Symptoms: depression  Mental Status Exam: Appearance:  Casual     Behavior: Appropriate  Motor: Normal  Speech/Language:  Normal Rate  Affect: Appropriate  Mood: pleasant  Thought process: anxious  Thought content:   WNL  Sensory/Perceptual disturbances:   WNL  Orientation: oriented to person, place, time/date, and situation  Attention: Good  Concentration: Good  Memory: WNL  Fund of knowledge:  Good  Insight:   Good  Judgment:  Good  Impulse Control: Good   Risk Assessment: Danger to Self:  No Self-injurious Behavior: No Danger to Others: No Duty to Warn:no Physical Aggression / Violence:No  Access to Firearms a concern: No  Gang Involvement:No   Subjective: The patient attended an individual therapy session via video visit.  The patient gave verbal consent for the session to be the on video on caregility and is aware of the limitations of telehealth.  The patient was in her home alone and the therapist was in the office.  The patient presents with a flat affect and mood anxious.  The patient reports that she has been very sick this week.  She states that she got over pounding headache and she ended up having to go to the emergency department.  The doctor told her that she probably has a virus and she reports that is a little better now that she has been taking ibuprofen.  The patient talked about her daughter and being frustrated with her daughter because she talked was her on the phone and was rude to her.  She states that her daughter told her she should not come down to [place]  if she is sick.  The patient of course did not say anything to her daughter about being rude to her and I explained to her that I will would probably have responded with  I know that and I will certainly not come if I am sick.  I also recommended that she say that she was being disrespectful to her.  The patient has a tendency not to speak up for herself when something like that happens.   Interventions: Cognitive Behavioral Therapy and Assertiveness/Communication, problem solving  Diagnosis:Major depression in remission (HCC)  Adjustment disorder with mixed anxiety and depressed mood  Plan: Treatment Plan  Strengths/Abilities:  Intelligent, insightful  Treatment Preferences:  Outpatient Individual therapy  Statement of Needs:  I need a therapist to help me with my depression  Symptoms:  decreased motivation: (Status: improved). poor concentration: (Status: improved). poor sleep: (Status: improved).  Problems Addressed:  Unipolar depression  Goals: 1. New Goal Statement for Unipolar Depression  Describe current and past experiences with depression including their impact on functioning and  attempts to resolve it. Target Date: 2024-09-20 Frequency: bi weekly Progress: 70 Modality: individual  2.Identify and replace thoughts and beliefs that support depression. Target Date: 2024-09-20 Frequency: bi weekly Progress: 80 Modality: individual  3.  Learn and implement behavioral strategies to overcome depression. Target Date: 2024-09-20 Frequency: bi weekly Progress: 80 Modality: individual  4.  Learn and implement problem-solving and decision-making skills. Target Date: 2024-09-20 Frequency: bi weekly Progress: 90 Modality: individual   Interventions by Therapist:  CBT , insight oriented approach and problem solving therapy.  Treatment plan created with patient and patient approves plan.  Sylvia Maldonado  Sylvia Bohr, LCSW

## 2024-01-09 DIAGNOSIS — R519 Headache, unspecified: Secondary | ICD-10-CM | POA: Diagnosis not present

## 2024-01-09 DIAGNOSIS — Z8639 Personal history of other endocrine, nutritional and metabolic disease: Secondary | ICD-10-CM | POA: Diagnosis not present

## 2024-01-09 DIAGNOSIS — E7800 Pure hypercholesterolemia, unspecified: Secondary | ICD-10-CM | POA: Diagnosis not present

## 2024-01-09 DIAGNOSIS — Z6824 Body mass index (BMI) 24.0-24.9, adult: Secondary | ICD-10-CM | POA: Diagnosis not present

## 2024-01-18 DIAGNOSIS — R519 Headache, unspecified: Secondary | ICD-10-CM | POA: Diagnosis not present

## 2024-01-18 DIAGNOSIS — Z6823 Body mass index (BMI) 23.0-23.9, adult: Secondary | ICD-10-CM | POA: Diagnosis not present

## 2024-01-19 ENCOUNTER — Ambulatory Visit (INDEPENDENT_AMBULATORY_CARE_PROVIDER_SITE_OTHER): Admitting: Psychology

## 2024-01-19 DIAGNOSIS — F325 Major depressive disorder, single episode, in full remission: Secondary | ICD-10-CM | POA: Diagnosis not present

## 2024-01-19 NOTE — Progress Notes (Signed)
 [facility] Counselor/Therapist Progress Note  Patient ID: Sylvia Maldonado, MRN: [id number],    Date: 01/19/2024  Time Spent:  48 minutes  Time in: 2:04 Time out:  2:52  Treatment Type: Individual Therapy  Reported Symptoms: depression  Mental Status Exam: Appearance:  Casual     Behavior: Appropriate  Motor: Normal  Speech/Language:  Normal Rate  Affect: Appropriate  Mood: pleasant  Thought process: WNL  Thought content:   WNL  Sensory/Perceptual disturbances:   WNL  Orientation: oriented to person, place, time/date, and situation  Attention: Good  Concentration: Good  Memory: WNL  Fund of knowledge:  Good  Insight:   Good  Judgment:  Good  Impulse Control: Good   Risk Assessment: Danger to Self:  No Self-injurious Behavior: No Danger to Others: No Duty to Warn:no Physical Aggression / Violence:No  Access to Firearms a concern: No  Gang Involvement:No   Subjective: The patient attended an individual therapy session via video visit.  The patient gave verbal consent for the session to be the on video on caregility and is aware of the limitations of telehealth.  The patient was in her home alone and the therapist was in the office.  The patient presents with a flat affect and mood is pleasant.  The patient reports that her trip to [place]  went well.  She saw her new great-grandchildren and she says they are perfect.  She seemed to be happy about having gone and it sounds like the trip was nice overall.  She talked about having headaches lately and her daughter seems to think it might be because she is not eating enough and not eating protein.  Her daughter has talked her into eating more [place].  We talked about her going back to walking and exercising.  She seems to be managing things right now pretty well and does well in the summer.  We will continue to help her problem solve and help her maintain her mood in a positive direction.  Interventions: Cognitive  Behavioral Therapy and Assertiveness/Communication, problem solving  Diagnosis:Major depression in remission [facility])  Plan: Treatment Plan  Strengths/Abilities:  Intelligent, insightful  Treatment Preferences:  Outpatient Individual therapy  Statement of Needs:  I need a therapist to help me with my depression  Symptoms:  decreased motivation: (Status: improved). poor concentration: (Status: improved). poor sleep: (Status: improved).  Problems Addressed:  Unipolar depression  Goals: 1. New Goal Statement for Unipolar Depression  Describe current and past experiences with depression including their impact on functioning and  attempts to resolve it. Target Date: 2024-09-20 Frequency: bi weekly Progress: 70 Modality: individual  2.Identify and replace thoughts and beliefs that support depression. Target Date: 2024-09-20 Frequency: bi weekly Progress: 80 Modality: individual  3.  Learn and implement behavioral strategies to overcome depression. Target Date: 2024-09-20 Frequency: bi weekly Progress: 80 Modality: individual  4.  Learn and implement problem-solving and decision-making skills. Target Date: 2024-09-20 Frequency: bi weekly Progress: 90 Modality: individual   Interventions by Therapist:  CBT , insight oriented approach and problem solving therapy.  Treatment plan created with patient and patient approves plan.  Seyed Heffley G Lyvonne Cassell, LCSW

## 2024-01-24 ENCOUNTER — Ambulatory Visit: Admitting: Podiatry

## 2024-01-24 DIAGNOSIS — N644 Mastodynia: Secondary | ICD-10-CM | POA: Diagnosis not present

## 2024-01-26 ENCOUNTER — Other Ambulatory Visit: Payer: Self-pay

## 2024-01-26 ENCOUNTER — Encounter (HOSPITAL_COMMUNITY): Payer: Self-pay | Admitting: Psychiatry

## 2024-01-26 ENCOUNTER — Ambulatory Visit (HOSPITAL_BASED_OUTPATIENT_CLINIC_OR_DEPARTMENT_OTHER): Admitting: Psychiatry

## 2024-01-26 VITALS — BP 137/73 | HR 75 | Ht 65.0 in | Wt 138.0 lb

## 2024-01-26 DIAGNOSIS — F325 Major depressive disorder, single episode, in full remission: Secondary | ICD-10-CM

## 2024-01-26 MED ORDER — CITALOPRAM HYDROBROMIDE 20 MG PO TABS: 20.0000 mg | ORAL_TABLET | Freq: Every day | ORAL | 4 refills | Status: DC

## 2024-01-26 MED ORDER — ARIPIPRAZOLE 10 MG PO TABS: ORAL_TABLET | ORAL | 10 refills | Status: DC

## 2024-01-26 NOTE — Progress Notes (Signed)
 Patient ID: Sylvia Maldonado, female   DOB: 1940/12/12, 83 y.o.   MRN: [id number] [facility] MD Progress Note  01/26/2024 4:24 PM Sylvia Maldonado  MRN:  [id number] Subjective: I am feel awful; I feel depressed. Principal Problem: Major depression in remission Diagnosis: Major depression recurrent  Today the patient is seen in the office.  Emotionally the patient seems to be doing quite well except she has been very much bothered by her persistent headache bilateral.  She also describes it frontally.  She implies it is the whole head.  It is associated with imbalance.  She says she feels dizzy.  She has periods where she has blurred vision.  She has no nausea or vomiting has no overt focal neurological symptoms other than her headache.  Her mood is stable.  She is happy with life.  She recently got back from [place] .  Her primary care doctor has tried her on Topamax for the last month with no benefit.  The patient is sleeping and eating well and has good energy.  She still walks every day.  She has had no falls.  She has no psychiatric complaints at this time.  Her main concern is her constant headache that has been a constant for the last 1 to 2 months.  The patient drinks no alcohol and uses no drugs.  She has never had any evidence of psychosis.  She takes Celexa  and Abilify .            Past Surgical History:  Procedure Laterality Date   cataracts surg     COLONOSCOPY     EYE SURGERY     PELVIC FLOOR REPAIR     TONSILLECTOMY     VAGINAL HYSTERECTOMY     Family History:  Family History  Problem Relation Age of Onset   CAD Neg Hx    Family Psychiatric  History:  Social History:  Social History   Substance and Sexual Activity  Alcohol Use No     Social History   Substance and Sexual Activity  Drug Use No    Social History   Socioeconomic History   Marital status: Single    Spouse name: Not on file   Number of children: Not on file   Years of education: Not on file    Highest education level: Not on file  Occupational History   Not on file  Tobacco Use   Smoking status: Never   Smokeless tobacco: Never  Vaping Use   Vaping status: Never Used  Substance and Sexual Activity   Alcohol use: No   Drug use: No   Sexual activity: Not Currently  Other Topics Concern   Not on file  Social History Narrative   Right handed    Lives alone at home    No caffeine usage    Social Drivers of Corporate investment banker Strain: Not on file  Food Insecurity: Not on file  Transportation Needs: Not on file  Physical Activity: Not on file  Stress: Not on file  Social Connections: Not on file   Additional Social History:                         Sleep: Good  Appetite:  Good  Current Medications: Current Outpatient Medications  Medication Sig Dispense Refill   atorvastatin (LIPITOR) 10 MG tablet Take 10 mg by mouth once a week. *Thursdays*     Cholecalciferol (VITAMIN D-3) 1000 UNITS CAPS Take  2,000 Units by mouth daily.     cycloSPORINE  (RESTASIS ) 0.05 % ophthalmic emulsion Place 1 drop into both eyes daily.      Magnesium 200 MG TABS 1 tablet with a meal     SHINGRIX injection      TOPAMAX 25 MG tablet 1 tablet at bedtime, increase by 1 per week to max of 4 per day Orally Once a day; Duration: 30 days     vitamin B-12 (CYANOCOBALAMIN ) 1000 MCG tablet Take 1,000 mcg by mouth daily.     ARIPiprazole  (ABILIFY ) 10 MG tablet 1  qam 30 tablet 10   citalopram  (CELEXA ) 20 MG tablet Take 1 tablet (20 mg total) by mouth daily. 90 tablet 4   Sodium Sulfate-Mag Sulfate-KCl (SUTAB) [phone] MG TABS TAKE BY MOUTH AS DIRECTED     valbenazine  (INGREZZA ) 60 MG capsule Take 1 capsule (60 mg total) by mouth daily. (Patient not taking: Reported on 01/26/2024) 30 capsule 5   No current facility-administered medications for this visit.    Lab Results: No results found for this or any previous visit (from the past 48 hours).  Physical Findings: AIMS:  , ,   ,  ,    CIWA:    COWS:     Musculoskeletal: Strength & Muscle Tone: within normal limits Gait & Station: normal Patient leans: Right  Psychiatric Specialty Exam: ROS  Blood pressure 137/73, pulse 75, height 5' 5 (1.651 m), weight 138 lb (62.6 kg).Body mass index is 22.96 kg/m.  General Appearance: Casual  Eye Contact::  Good  Speech:  Clear and Coherent  Volume:  Normal  Mood:  NA  Affect:  Congruent  Thought Process:  Coherent  Orientation:  Full (Time, Place, and Person)  Thought Content:  WDL  Suicidal Thoughts:  No  Homicidal Thoughts:  No  Memory:  NA  Judgement:  NA  Insight:  Good  Psychomotor Activity:  Normal  Concentration:  Good  Recall:  Good  Fund of Knowledge:Good  Language: Good  Akathisia:  No  Handed:  Right  AIMS (if indicated):     Assets:  Desire for Improvement  ADL's:  Intact  Cognition: WNL  Sleep:       Sylvia Maldonado 01/26/2024, 4:24 PM  At this time the patient's diagnosis is major depression in remission.  She requires Celexa  on a regular basis and Abilify .  Efforts to remove her Abilify  have led to a decline in her mood state.  She is fully aware of the potential of tardive dyskinesia which she shows no evidence of at this time.  She has no tremor or signs of TD.  At one point she was taking Ingrezza  but no longer.  The patient continues in therapy on a regular basis and return to see me in about 2 months.  I will make an effort to reach out to her primary care doctor to speak about her physical complaints of her headache and dizziness.  Location: Patient: home Provider: office   I discussed the limitations, risks, security and privacy concerns of performing an evaluation and management service by telephone and the availability of in person appointments. I also discussed with the patient that there may be a patient responsible charge related to this service. The patient expressed understanding and agreed to proceed.      I discussed  the assessment and treatment plan with the patient. The patient was provided an opportunity to ask questions and all were answered. The patient agreed with the  plan and demonstrated an understanding of the instructions.   The patient was advised to call back or seek an in-person evaluation if the symptoms worsen or if the condition fails to improve as anticipated.  I provided 30 minutes of non-face-to-face time during this encounter.   Sylvia LILLETTE Lo, MD

## 2024-01-31 ENCOUNTER — Ambulatory Visit (HOSPITAL_COMMUNITY): Payer: Medicare Other | Admitting: Psychiatry

## 2024-02-02 ENCOUNTER — Ambulatory Visit (INDEPENDENT_AMBULATORY_CARE_PROVIDER_SITE_OTHER): Admitting: Psychology

## 2024-02-02 ENCOUNTER — Other Ambulatory Visit: Payer: Self-pay | Admitting: Family Medicine

## 2024-02-02 DIAGNOSIS — F325 Major depressive disorder, single episode, in full remission: Secondary | ICD-10-CM | POA: Diagnosis not present

## 2024-02-02 DIAGNOSIS — R519 Headache, unspecified: Secondary | ICD-10-CM

## 2024-02-02 NOTE — Progress Notes (Signed)
 [facility] Counselor/Therapist Progress Note  Patient ID: Sylvia Maldonado, MRN: [id number],    Date: 02/02/2024  Time Spent:  47 minutes  Time in: 2:05 Time out:  2:52  Treatment Type: Individual Therapy  Reported Symptoms: depression  Mental Status Exam: Appearance:  Casual     Behavior: Appropriate  Motor: Normal  Speech/Language:  Normal Rate  Affect: flat  Mood: sad  Thought process: WNL  Thought content:   WNL  Sensory/Perceptual disturbances:   WNL  Orientation: oriented to person, place, time/date, and situation  Attention: Good  Concentration: Good  Memory: WNL  Fund of knowledge:  Good  Insight:   Good  Judgment:  Good  Impulse Control: Good   Risk Assessment: Danger to Self:  No Self-injurious Behavior: No Danger to Others: No Duty to Warn:no Physical Aggression / Violence:No  Access to Firearms a concern: No  Gang Involvement:No   Subjective: The patient attended an individual therapy session via video visit.  The patient gave verbal consent for the session to be the on video on caregility and is aware of the limitations of telehealth.  The patient was in her home alone and the therapist was in the office.  The patient presents with a flat affect and mood is sad.  The patient reports that she is still having headaches and dizziness.  She does have some appointments coming up and they are going to check her with a CT scan.  We talked about the possibility that it could be medication related and that if it ends up not being something physical going on that she may want to talk with Dr. Tasia about her medicines.  The patient reports that she is not feeling well and is still walking but not doing much else.  Encouraged her to make sure that she keeps her doctor's appointments.  I also encouraged her to consider using a cane or some sort of assistive device as she talked about feeling dizzy a lot.  I explained to her that if she did not use  something and she felt it would probably be much worse.    Interventions: Cognitive Behavioral Therapy and Assertiveness/Communication, problem solving  Diagnosis:Major depressive disorder in full remission, unspecified whether recurrent (HCC)  Plan: Treatment Plan  Strengths/Abilities:  Intelligent, insightful  Treatment Preferences:  Outpatient Individual therapy  Statement of Needs:  I need a therapist to help me with my depression  Symptoms:  decreased motivation: (Status: improved). poor concentration: (Status: improved). poor sleep: (Status: improved).  Problems Addressed:  Unipolar depression  Goals: 1. New Goal Statement for Unipolar Depression  Describe current and past experiences with depression including their impact on functioning and  attempts to resolve it. Target Date: 2024-09-20 Frequency: bi weekly Progress: 70 Modality: individual  2.Identify and replace thoughts and beliefs that support depression. Target Date: 2024-09-20 Frequency: bi weekly Progress: 80 Modality: individual  3.  Learn and implement behavioral strategies to overcome depression. Target Date: 2024-09-20 Frequency: bi weekly Progress: 80 Modality: individual  4.  Learn and implement problem-solving and decision-making skills. Target Date: 2024-09-20 Frequency: bi weekly Progress: 90 Modality: individual   Interventions by Therapist:  CBT , insight oriented approach and problem solving therapy.  Treatment plan created with patient and patient approves plan.  Abrianna Sidman G Angline Schweigert, LCSW

## 2024-02-07 ENCOUNTER — Encounter: Payer: Self-pay | Admitting: Podiatry

## 2024-02-07 ENCOUNTER — Ambulatory Visit: Admitting: Podiatry

## 2024-02-07 DIAGNOSIS — M79674 Pain in right toe(s): Secondary | ICD-10-CM | POA: Diagnosis not present

## 2024-02-07 DIAGNOSIS — B351 Tinea unguium: Secondary | ICD-10-CM

## 2024-02-07 DIAGNOSIS — M79675 Pain in left toe(s): Secondary | ICD-10-CM

## 2024-02-07 DIAGNOSIS — M216X1 Other acquired deformities of right foot: Secondary | ICD-10-CM

## 2024-02-07 DIAGNOSIS — L84 Corns and callosities: Secondary | ICD-10-CM

## 2024-02-07 NOTE — Progress Notes (Signed)
 This patient returns to the office for evaluation and treatment of long thick painful nails .  This patient is unable to trim her own nails since the patient cannot reach her feet.  Patient says the nails are painful walking and wearing his shoes.  She returns for preventive foot care services.  General Appearance  Alert, conversant and in no acute stress.  Vascular  Dorsalis pedis and posterior tibial  pulses are palpable  bilaterally.  Capillary return is within normal limits  bilaterally. Temperature is within normal limits  bilaterally.  Neurologic  Senn-Weinstein monofilament wire test within normal limits  bilaterally. Muscle power within normal limits bilaterally.  Nails Thick disfigured discolored nails with subungual debris  from hallux to fifth toes bilaterally. No evidence of bacterial infection or drainage bilaterally.  Orthopedic  No limitations of motion  feet .  No crepitus or effusions noted.   HAV 1st MPJ  B/L.  Hammer toes 2-5  B/L. Plantar flexed fifth met  B/L.  Skin  normotropic skin with no porokeratosis noted bilaterally.  No signs of infections or ulcers noted.   Callus subfifth met  right foot.  Onychomycosis  Pain in toes right foot  Pain in toes left foot   Debridement  of nails  1-5  B/L with a nail nipper.  Nails were then filed using a dremel tool with no incidents.  Debride callus sub 5th met right foot.  RTC 10 weeks    Ruffin Cotton DPM

## 2024-02-09 DIAGNOSIS — E7800 Pure hypercholesterolemia, unspecified: Secondary | ICD-10-CM | POA: Diagnosis not present

## 2024-02-14 DIAGNOSIS — R519 Headache, unspecified: Secondary | ICD-10-CM | POA: Diagnosis not present

## 2024-02-16 ENCOUNTER — Ambulatory Visit: Admitting: Psychology

## 2024-02-16 DIAGNOSIS — F4323 Adjustment disorder with mixed anxiety and depressed mood: Secondary | ICD-10-CM

## 2024-02-16 DIAGNOSIS — F325 Major depressive disorder, single episode, in full remission: Secondary | ICD-10-CM

## 2024-02-16 NOTE — Progress Notes (Unsigned)
 [facility] Counselor/Therapist Progress Note  Patient ID: Sylvia Maldonado, MRN: [id number],    Date: 02/16/2024  Time Spent:   Time in: 2:05 Time out:  2:57  Treatment Type: Individual Therapy  Reported Symptoms: depression  Mental Status Exam: Appearance:  Casual     Behavior: Appropriate  Motor: Normal  Speech/Language:  Normal Rate  Affect: flat  Mood: pleasant  Thought process: WNL  Thought content:   WNL  Sensory/Perceptual disturbances:   WNL  Orientation: oriented to person, place, time/date, and situation  Attention: Good  Concentration: Good  Memory: WNL  Fund of knowledge:  Good  Insight:   Good  Judgment:  Good  Impulse Control: Good   Risk Assessment: Danger to Self:  No Self-injurious Behavior: No Danger to Others: No Duty to Warn:no Physical Aggression / Violence:No  Access to Firearms a concern: No  Gang Involvement:No   Subjective: The patient attended an individual therapy session via video visit.  The patient gave verbal consent for the session to be the on video on caregility and is aware of the limitations of telehealth.  The patient was in her home alone and the therapist was in the office.  The patient presents with a flat affect and mood is pleasant.  The patient reports that her headaches seem to be doing better.  She says that her doctor increased her from 30 mg of Topamax a day to 100 mg of Topamax a day.  She asked if that was a normal change and it is a normal dose.  The patient reports that she is doing better with her headaches and she is continuing to walk.  We talked today about her getting more protein in her diet as sometimes she we will decide that she is tired of eating something and then she eats very little and gets very little protein and.  I recommended that she go to a nutritionist but she said that she would not do that.  We went through a list of things that she could possibly add to her diet that would  add some protein in but I explained that I was not the person that she probably needed to talk to about that.  Interventions: Cognitive Behavioral Therapy and Assertiveness/Communication, problem solving  Diagnosis:Major depressive disorder in full remission, unspecified whether recurrent (HCC)  Adjustment disorder with mixed anxiety and depressed mood  Plan: Treatment Plan  Strengths/Abilities:  Intelligent, insightful  Treatment Preferences:  Outpatient Individual therapy  Statement of Needs:  I need a therapist to help me with my depression  Symptoms:  decreased motivation: (Status: improved). poor concentration: (Status: improved). poor sleep: (Status: improved).  Problems Addressed:  Unipolar depression  Goals: 1. New Goal Statement for Unipolar Depression  Describe current and past experiences with depression including their impact on functioning and  attempts to resolve it. Target Date: 2024-09-20 Frequency: bi weekly Progress: 70 Modality: individual  2.Identify and replace thoughts and beliefs that support depression. Target Date: 2024-09-20 Frequency: bi weekly Progress: 80 Modality: individual  3.  Learn and implement behavioral strategies to overcome depression. Target Date: 2024-09-20 Frequency: bi weekly Progress: 80 Modality: individual  4.  Learn and implement problem-solving and decision-making skills. Target Date: 2024-09-20 Frequency: bi weekly Progress: 90 Modality: individual   Interventions by Therapist:  CBT , insight oriented approach and problem solving therapy.  Treatment plan created with patient and patient approves plan.  Oveda Dadamo G Marjory Meints, LCSW

## 2024-03-01 ENCOUNTER — Ambulatory Visit (INDEPENDENT_AMBULATORY_CARE_PROVIDER_SITE_OTHER): Admitting: Psychology

## 2024-03-01 DIAGNOSIS — F325 Major depressive disorder, single episode, in full remission: Secondary | ICD-10-CM

## 2024-03-01 DIAGNOSIS — F4323 Adjustment disorder with mixed anxiety and depressed mood: Secondary | ICD-10-CM | POA: Diagnosis not present

## 2024-03-01 NOTE — Progress Notes (Signed)
 [facility] Counselor/Therapist Progress Note  Patient ID: Shanikia Kernodle, MRN: [id number],    Date: 03/01/2024  Time Spent:   Time in: 2:04 Time out:  2:55  Treatment Type: Individual Therapy  Reported Symptoms: depression  Mental Status Exam: Appearance:  Casual     Behavior: Appropriate  Motor: Normal  Speech/Language:  Normal Rate  Affect: flat  Mood: Sad and anxious  Thought process: WNL  Thought content:   WNL  Sensory/Perceptual disturbances:   WNL  Orientation: oriented to person, place, time/date, and situation  Attention: Good  Concentration: Good  Memory: WNL  Fund of knowledge:  Good  Insight:   Good  Judgment:  Good  Impulse Control: Good   Risk Assessment: Danger to Self:  No Self-injurious Behavior: No Danger to Others: No Duty to Warn:no Physical Aggression / Violence:No  Access to Firearms a concern: No  Gang Involvement:No   Subjective: The patient attended an individual therapy session via video visit.  The patient gave verbal consent for the session to be the on video on caregility and is aware of the limitations of telehealth.  The patient was in her home alone and the therapist was in the office.  The patient presents with a flat affect and mood is sad and anxious.  The patient reports that her headaches are back and are stronger than they were.  She also reports that she has had dizziness, loss of balance, and she feels horrible.  I recommended that she contact her doctor and find out what they would recommend for her.  They said that they wanted her to go to the [facility] at [place] and be checked out.  I had to instruct her on how to handle the situation and she did not want to go but I think I talked her into going this evening with her daughter.  I explained that if they did not find anything wrong with her that maybe she wants to contact Dr. Tasia or have them contact Dr. Tasia to see if there is something  related to the medications that she is on that can be causing her these problems.  She reports that she has had some confusion and she reports that she lost her balance in an exercise class yesterday.  I talked to her through what to do and hopefully she is following through with that.   Interventions: Cognitive Behavioral Therapy and Assertiveness/Communication, problem solving  Diagnosis:Major depressive disorder in full remission, unspecified whether recurrent (HCC)  Adjustment disorder with mixed anxiety and depressed mood  Plan: Treatment Plan  Strengths/Abilities:  Intelligent, insightful  Treatment Preferences:  Outpatient Individual therapy  Statement of Needs:  I need a therapist to help me with my depression  Symptoms:  decreased motivation: (Status: improved). poor concentration: (Status: improved). poor sleep: (Status: improved).  Problems Addressed:  Unipolar depression  Goals: 1. New Goal Statement for Unipolar Depression  Describe current and past experiences with depression including their impact on functioning and  attempts to resolve it. Target Date: 2024-09-20 Frequency: bi weekly Progress: 70 Modality: individual  2.Identify and replace thoughts and beliefs that support depression. Target Date: 2024-09-20 Frequency: bi weekly Progress: 80 Modality: individual  3.  Learn and implement behavioral strategies to overcome depression. Target Date: 2024-09-20 Frequency: bi weekly Progress: 80 Modality: individual  4.  Learn and implement problem-solving and decision-making skills. Target Date: 2024-09-20 Frequency: bi weekly Progress: 90 Modality: individual   Interventions by Therapist:  CBT , insight oriented  approach and problem solving therapy.  Treatment plan created with patient and patient approves plan.  Iniko Robles G Merritt Mccravy,  LCSW                                                                                                                     Abijah Roussel G Jaiyanna Safran, LCSW

## 2024-03-05 ENCOUNTER — Ambulatory Visit
Admission: RE | Admit: 2024-03-05 | Discharge: 2024-03-05 | Disposition: A | Discharge status: Home / Self Care | Source: Ambulatory Visit | Attending: Family Medicine | Admitting: Family Medicine

## 2024-03-05 DIAGNOSIS — R519 Headache, unspecified: Secondary | ICD-10-CM

## 2024-03-05 MED ORDER — GADOPICLENOL 0.5 MMOL/ML IV SOLN
6.5000 mL | Freq: Once | INTRAVENOUS | Status: AC | PRN
  Administered 2024-03-05: 6.5 mL via INTRAVENOUS

## 2024-03-07 ENCOUNTER — Ambulatory Visit (INDEPENDENT_AMBULATORY_CARE_PROVIDER_SITE_OTHER): Admitting: Psychology

## 2024-03-07 DIAGNOSIS — F325 Major depressive disorder, single episode, in full remission: Secondary | ICD-10-CM

## 2024-03-07 DIAGNOSIS — F4323 Adjustment disorder with mixed anxiety and depressed mood: Secondary | ICD-10-CM | POA: Diagnosis not present

## 2024-03-07 NOTE — Progress Notes (Signed)
 [facility] Counselor/Therapist Progress Note  Patient ID: Sylvia Maldonado, MRN: [id number],    Date: 03/07/2024  Time Spent:  46 minutes  Time in: 2:03 Time out:  2:49  Treatment Type: Individual Therapy  Reported Symptoms: depression  Mental Status Exam: Appearance:  Casual     Behavior: Appropriate  Motor: Normal  Speech/Language:  Normal Rate  Affect: flat  Mood: pleasant  Thought process: WNL  Thought content:   WNL  Sensory/Perceptual disturbances:   WNL  Orientation: oriented to person, place, time/date, and situation  Attention: Good  Concentration: Good  Memory: WNL  Fund of knowledge:  Good  Insight:   Good  Judgment:  Good  Impulse Control: Good   Risk Assessment: Danger to Self:  No Self-injurious Behavior: No Danger to Others: No Duty to Warn:no Physical Aggression / Violence:No  Access to Firearms a concern: No  Gang Involvement:No   Subjective: The patient attended an individual therapy session via video visit.  The patient gave verbal consent for the session to be the on video on caregility and is aware of the limitations of telehealth.  The patient was in her home alone and the therapist was in the office.  The patient presents with a flat affect and mood is pleasant.  The patient reports that she is doing better and her headaches and dizziness seem to be somewhat better.  She reported that she noticed that she felt better after she ate a veggie burger that she has.  We went through the ingredients of the veggie burger and talked about the possibility that her labs could be off and that the what is causing her headaches and her dizziness.  It is possible that she does not have enough sodium or potassium in her system and I encouraged her to contact her primary medical doctor and see if she can get labs drawn before her appointment next week so that they could look at her sodium.  She did not go to the [employer] last week when I  ask her to do that we talked about the need for her to make sure that she is getting herself checked out as it could be related to something physical or even possibly her medications that she is on through her psychiatrist.  Interventions: Cognitive Behavioral Therapy and Assertiveness/Communication, problem solving  Diagnosis:Major depressive disorder in full remission, unspecified whether recurrent (HCC)  Adjustment disorder with mixed anxiety and depressed mood  Plan: Treatment Plan  Strengths/Abilities:  Intelligent, insightful  Treatment Preferences:  Outpatient Individual therapy  Statement of Needs:  I need a therapist to help me with my depression  Symptoms:  decreased motivation: (Status: improved). poor concentration: (Status: improved). poor sleep: (Status: improved).  Problems Addressed:  Unipolar depression  Goals: 1. New Goal Statement for Unipolar Depression  Describe current and past experiences with depression including their impact on functioning and  attempts to resolve it. Target Date: 2024-09-20 Frequency: bi weekly Progress: 70 Modality: individual  2.Identify and replace thoughts and beliefs that support depression. Target Date: 2024-09-20 Frequency: bi weekly Progress: 80 Modality: individual  3.  Learn and implement behavioral strategies to overcome depression. Target Date: 2024-09-20 Frequency: bi weekly Progress: 80 Modality: individual  4.  Learn and implement problem-solving and decision-making skills. Target Date: 2024-09-20 Frequency: bi weekly Progress: 90 Modality: individual   Interventions by Therapist:  CBT , insight oriented approach and problem solving therapy.  Treatment plan created with patient and patient approves plan.  Isreal Moline  G Alban Marucci,  LCSW

## 2024-03-11 DIAGNOSIS — E7800 Pure hypercholesterolemia, unspecified: Secondary | ICD-10-CM | POA: Diagnosis not present

## 2024-03-14 DIAGNOSIS — G4452 New daily persistent headache (NDPH): Secondary | ICD-10-CM | POA: Diagnosis not present

## 2024-03-14 DIAGNOSIS — Z23 Encounter for immunization: Secondary | ICD-10-CM | POA: Diagnosis not present

## 2024-03-14 DIAGNOSIS — R55 Syncope and collapse: Secondary | ICD-10-CM | POA: Diagnosis not present

## 2024-03-29 ENCOUNTER — Ambulatory Visit: Admitting: Psychology

## 2024-03-29 DIAGNOSIS — F4323 Adjustment disorder with mixed anxiety and depressed mood: Secondary | ICD-10-CM | POA: Diagnosis not present

## 2024-03-29 DIAGNOSIS — F325 Major depressive disorder, single episode, in full remission: Secondary | ICD-10-CM

## 2024-03-29 NOTE — Progress Notes (Addendum)
 [facility] Counselor/Therapist Progress Note  Patient ID: Sylvia Maldonado, MRN: [id number],    Date: 03/29/2024  Time Spent:  54 minutes  Time in: 2:05 Time out:  2:59  Treatment Type: Individual Therapy  Reported Symptoms: depression  Mental Status Exam: Appearance:  Casual     Behavior: Appropriate  Motor: Normal  Speech/Language:  Normal Rate  Affect: flat  Mood: pleasant  Thought process: WNL  Thought content:   WNL  Sensory/Perceptual disturbances:   WNL  Orientation: oriented to person, place, time/date, and situation  Attention: Good  Concentration: Good  Memory: WNL  Fund of knowledge:  Good  Insight:   Good  Judgment:  Good  Impulse Control: Good   Risk Assessment: Danger to Self:  No Self-injurious Behavior: No Danger to Others: No Duty to Warn:no Physical Aggression / Violence:No  Access to Firearms a concern: No  Gang Involvement:No   Subjective: The patient attended an individual therapy session via video visit.  The patient gave verbal consent for the session to be the on video on caregility and is aware of the limitations of telehealth.  The patient was in her home alone and the therapist was in the office.  The patient presents with a flat affect and mood is pleasant.  The patient reports that all of her dizziness and headaches seem to be gone now.  She reports that she has been doing well and she has returned to walking and exercising at the gym.  She states that she did a way with the yoga class because she got dizzy that time and I explained to her that it might have been just a medical thing and that if she really liked to do yoga that she could go back.  She states that she decided that she does not want to do yoga anymore.  And wants the way she is in all or nothing thinker.  We talked about keeping herself healthy and she plans to go to [place]  at Thanksgiving to spend time with her daughter and her daughter's family.  Encouraged her  to continue to do good things for herself.   Interventions: Cognitive Behavioral Therapy and Assertiveness/Communication, problem solving  Diagnosis:Major depressive disorder in full remission, unspecified whether recurrent (HCC)  Adjustment disorder with mixed anxiety and depressed mood  Plan: Treatment Plan  Strengths/Abilities:  Intelligent, insightful  Treatment Preferences:  Outpatient Individual therapy  Statement of Needs:  I need a therapist to help me with my depression  Symptoms:  decreased motivation: (Status: improved). poor concentration: (Status: improved). poor sleep: (Status: improved).  Problems Addressed:  Unipolar depression  Goals: 1. New Goal Statement for Unipolar Depression  Describe current and past experiences with depression including their impact on functioning and  attempts to resolve it. Target Date: 2024-09-20 Frequency: bi weekly Progress: 70 Modality: individual  2.Identify and replace thoughts and beliefs that support depression. Target Date: 2024-09-20 Frequency: bi weekly Progress: 80 Modality: individual  3.  Learn and implement behavioral strategies to overcome depression. Target Date: 2024-09-20 Frequency: bi weekly Progress: 80 Modality: individual  4.  Learn and implement problem-solving and decision-making skills. Target Date: 2024-09-20 Frequency: bi weekly Progress: 90 Modality: individual   Interventions by Therapist:  CBT , insight oriented approach and problem solving therapy.  Treatment plan created with patient and patient approves plan.  Zakkiyya Barno G Salbador Fiveash, LCSW

## 2024-04-10 DIAGNOSIS — E7800 Pure hypercholesterolemia, unspecified: Secondary | ICD-10-CM | POA: Diagnosis not present

## 2024-04-10 DIAGNOSIS — Z23 Encounter for immunization: Secondary | ICD-10-CM | POA: Diagnosis not present

## 2024-04-11 ENCOUNTER — Ambulatory Visit (HOSPITAL_COMMUNITY): Admitting: Psychiatry

## 2024-04-12 ENCOUNTER — Ambulatory Visit (INDEPENDENT_AMBULATORY_CARE_PROVIDER_SITE_OTHER): Admitting: Psychology

## 2024-04-12 DIAGNOSIS — F325 Major depressive disorder, single episode, in full remission: Secondary | ICD-10-CM

## 2024-04-12 DIAGNOSIS — F4323 Adjustment disorder with mixed anxiety and depressed mood: Secondary | ICD-10-CM | POA: Diagnosis not present

## 2024-04-12 NOTE — Progress Notes (Unsigned)
                edge,  experiencing concentration difficulties, having trouble falling or staying asleep, exhibiting a general  state of irritability).: No Description Entered (Status: improved). Motor tension (e.g., restlessness,  tiredness, shakiness, muscle tension).: No Description Entered (Status: improved).  Problems Addressed  Anxiety, Phase Of Life Problems, Anxiety  Goals 1. Learn and implement coping skills that result in a reduction of anxiety  and worry, and improved daily functioning. Objective Learn  and implement calming skills to reduce overall anxiety and manage anxiety symptoms. Target Date: 2025-08-09Frequency: Weekly Progress: 40 Modality: individual  Related Interventions 1. Teach the client calming/relaxation skills (e.g., applied relaxation, progressive muscle  relaxation, cue controlled relaxation; mindful breathing; biofeedback) and how to discriminate  better between relaxation and tension; teach the client how to apply these skills to his/her daily  life (e.g., New Directions in Progressive Muscle Relaxation by Marcelyn Ditty, and  Hazlett-Stevens; Treating Generalized Anxiety Disorder by Rygh and Ida Rogue). Objective Identify, challenge, and replace biased, fearful self-talk with positive, realistic, and empowering selftalk. Target Date: 2024-02-18 Frequency: weekly Progress: 30 Modality: individual Related Interventions 1. Explore the client's schema and self-talk that mediate his/her fear response; assist him/her in  challenging the biases; replace the distorted messages with reality-based alternatives and  positive, realistic self-talk that will increase his/her self-confidence in coping with irrational  fears (see Cognitive Therapy of Anxiety Disorders by Laurence Slate). Objective Learn and implement problem-solving strategies for realistically addressing worries. Target Date: 2025-08-09Frequency: weekly Progress: 40 Modality: individual 2. Resolve conflicted feelings and adapt to the new life circumstances. Objective Apply problem-solving skills to current circumstances. Target Date: 2024-02-18 Frequency: weekly Progress: 20 Modality: individual Related Interventions 1. Teach the client problem-resolution skills (e.g., defining the problem clearly, brainstorming  multiple solutions, listing the pros and cons of each solution, seeking input from others,  selecting and implementing a plan of action, evaluating outcome, and readjusting plan as   necessary).   3. Stabilize anxiety level while increasing ability to function on a daily  basis. Diagnosis F33.1  Major depressive disorder, moderate 300.02 (Generalized anxiety disorder) - Open - [Signifier: n/a]  Axis  none 309.28 (Adjustment disorder with mixed anxiety and depressed  mood) - Open - [Signifier: n/a]  Adjustment Disorder,  With Anxiety   Marital conflict  Major Depressive disorder, moderate  Conditions For Discharge Achievement of treatment goals and objectives.  The patient approved this plan.   Deonna Krummel G Ethridge Sollenberger, LCSW

## 2024-04-13 ENCOUNTER — Ambulatory Visit (HOSPITAL_COMMUNITY): Admitting: Psychiatry

## 2024-04-26 ENCOUNTER — Ambulatory Visit: Admitting: Psychology

## 2024-05-01 ENCOUNTER — Ambulatory Visit: Admitting: Podiatry

## 2024-05-01 ENCOUNTER — Encounter: Payer: Self-pay | Admitting: Podiatry

## 2024-05-01 DIAGNOSIS — M79675 Pain in left toe(s): Secondary | ICD-10-CM | POA: Diagnosis not present

## 2024-05-01 DIAGNOSIS — M79674 Pain in right toe(s): Secondary | ICD-10-CM

## 2024-05-01 DIAGNOSIS — B351 Tinea unguium: Secondary | ICD-10-CM | POA: Diagnosis not present

## 2024-05-01 DIAGNOSIS — M216X1 Other acquired deformities of right foot: Secondary | ICD-10-CM

## 2024-05-01 NOTE — Progress Notes (Signed)
 This patient returns to the office for evaluation and treatment of long thick painful nails .  This patient is unable to trim her own nails since the patient cannot reach her feet.  Patient says the nails are painful walking and wearing his shoes.  She returns for preventive foot care services.  General Appearance  Alert, conversant and in no acute stress.  Vascular  Dorsalis pedis and posterior tibial  pulses are palpable  bilaterally.  Capillary return is within normal limits  bilaterally. Temperature is within normal limits  bilaterally.  Neurologic  Senn-Weinstein monofilament wire test within normal limits  bilaterally. Muscle power within normal limits bilaterally.  Nails Thick disfigured discolored nails with subungual debris  from hallux to fifth toes bilaterally. No evidence of bacterial infection or drainage bilaterally.  Orthopedic  No limitations of motion  feet .  No crepitus or effusions noted.   HAV 1st MPJ  B/L.  Hammer toes 2-5  B/L. Plantar flexed fifth met  B/L.  Skin  normotropic skin with no porokeratosis noted bilaterally.  No signs of infections or ulcers noted.   Callus sufifth met  right foot.  Onychomycosis  Pain in toes right foot  Pain in toes left foot   Debridement  of nails  1-5  B/L with a nail nipper.  Nails were then filed using a dremel tool with no incidents.  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  RTC 10 weeks    Cordella Bold DPM

## 2024-05-10 ENCOUNTER — Ambulatory Visit (INDEPENDENT_AMBULATORY_CARE_PROVIDER_SITE_OTHER): Admitting: Psychology

## 2024-05-10 DIAGNOSIS — F325 Major depressive disorder, single episode, in full remission: Secondary | ICD-10-CM | POA: Diagnosis not present

## 2024-05-10 DIAGNOSIS — F4323 Adjustment disorder with mixed anxiety and depressed mood: Secondary | ICD-10-CM | POA: Diagnosis not present

## 2024-05-10 NOTE — Progress Notes (Unsigned)
                edge,  experiencing concentration difficulties, having trouble falling or staying asleep, exhibiting a general  state of irritability).: No Description Entered (Status: improved). Motor tension (e.g., restlessness,  tiredness, shakiness, muscle tension).: No Description Entered (Status: improved).  Problems Addressed  Anxiety, Phase Of Life Problems, Anxiety  Goals 1. Learn and implement coping skills that result in a reduction of anxiety  and worry, and improved daily functioning. Objective Learn  and implement calming skills to reduce overall anxiety and manage anxiety symptoms. Target Date: 2025-08-09Frequency: Weekly Progress: 40 Modality: individual  Related Interventions 1. Teach the client calming/relaxation skills (e.g., applied relaxation, progressive muscle  relaxation, cue controlled relaxation; mindful breathing; biofeedback) and how to discriminate  better between relaxation and tension; teach the client how to apply these skills to his/her daily  life (e.g., New Directions in Progressive Muscle Relaxation by Marcelyn Ditty, and  Hazlett-Stevens; Treating Generalized Anxiety Disorder by Rygh and Ida Rogue). Objective Identify, challenge, and replace biased, fearful self-talk with positive, realistic, and empowering selftalk. Target Date: 2024-02-18 Frequency: weekly Progress: 30 Modality: individual Related Interventions 1. Explore the client's schema and self-talk that mediate his/her fear response; assist him/her in  challenging the biases; replace the distorted messages with reality-based alternatives and  positive, realistic self-talk that will increase his/her self-confidence in coping with irrational  fears (see Cognitive Therapy of Anxiety Disorders by Laurence Slate). Objective Learn and implement problem-solving strategies for realistically addressing worries. Target Date: 2025-08-09Frequency: weekly Progress: 40 Modality: individual 2. Resolve conflicted feelings and adapt to the new life circumstances. Objective Apply problem-solving skills to current circumstances. Target Date: 2024-02-18 Frequency: weekly Progress: 20 Modality: individual Related Interventions 1. Teach the client problem-resolution skills (e.g., defining the problem clearly, brainstorming  multiple solutions, listing the pros and cons of each solution, seeking input from others,  selecting and implementing a plan of action, evaluating outcome, and readjusting plan as   necessary).   3. Stabilize anxiety level while increasing ability to function on a daily  basis. Diagnosis F33.1  Major depressive disorder, moderate 300.02 (Generalized anxiety disorder) - Open - [Signifier: n/a]  Axis  none 309.28 (Adjustment disorder with mixed anxiety and depressed  mood) - Open - [Signifier: n/a]  Adjustment Disorder,  With Anxiety   Marital conflict  Major Depressive disorder, moderate  Conditions For Discharge Achievement of treatment goals and objectives.  The patient approved this plan.   Deonna Krummel G Ethridge Sollenberger, LCSW

## 2024-05-11 DIAGNOSIS — E7800 Pure hypercholesterolemia, unspecified: Secondary | ICD-10-CM | POA: Diagnosis not present

## 2024-05-17 DIAGNOSIS — R051 Acute cough: Secondary | ICD-10-CM | POA: Diagnosis not present

## 2024-05-24 ENCOUNTER — Ambulatory Visit: Admitting: Psychology

## 2024-05-28 DIAGNOSIS — L814 Other melanin hyperpigmentation: Secondary | ICD-10-CM | POA: Diagnosis not present

## 2024-05-28 DIAGNOSIS — L821 Other seborrheic keratosis: Secondary | ICD-10-CM | POA: Diagnosis not present

## 2024-05-28 DIAGNOSIS — D1801 Hemangioma of skin and subcutaneous tissue: Secondary | ICD-10-CM | POA: Diagnosis not present

## 2024-05-31 DIAGNOSIS — R053 Chronic cough: Secondary | ICD-10-CM | POA: Diagnosis not present

## 2024-05-31 DIAGNOSIS — Z79899 Other long term (current) drug therapy: Secondary | ICD-10-CM | POA: Diagnosis not present

## 2024-05-31 DIAGNOSIS — G4452 New daily persistent headache (NDPH): Secondary | ICD-10-CM | POA: Diagnosis not present

## 2024-05-31 DIAGNOSIS — E7800 Pure hypercholesterolemia, unspecified: Secondary | ICD-10-CM | POA: Diagnosis not present

## 2024-05-31 DIAGNOSIS — E559 Vitamin D deficiency, unspecified: Secondary | ICD-10-CM | POA: Diagnosis not present

## 2024-05-31 DIAGNOSIS — F339 Major depressive disorder, recurrent, unspecified: Secondary | ICD-10-CM | POA: Diagnosis not present

## 2024-06-05 ENCOUNTER — Ambulatory Visit: Admitting: Psychology

## 2024-06-05 DIAGNOSIS — F325 Major depressive disorder, single episode, in full remission: Secondary | ICD-10-CM | POA: Diagnosis not present

## 2024-06-05 DIAGNOSIS — F4323 Adjustment disorder with mixed anxiety and depressed mood: Secondary | ICD-10-CM

## 2024-06-05 NOTE — Progress Notes (Unsigned)
                edge,  experiencing concentration difficulties, having trouble falling or staying asleep, exhibiting a general  state of irritability).: No Description Entered (Status: improved). Motor tension (e.g., restlessness,  tiredness, shakiness, muscle tension).: No Description Entered (Status: improved).  Problems Addressed  Anxiety, Phase Of Life Problems, Anxiety  Goals 1. Learn and implement coping skills that result in a reduction of anxiety  and worry, and improved daily functioning. Objective Learn  and implement calming skills to reduce overall anxiety and manage anxiety symptoms. Target Date: 2025-08-09Frequency: Weekly Progress: 40 Modality: individual  Related Interventions 1. Teach the client calming/relaxation skills (e.g., applied relaxation, progressive muscle  relaxation, cue controlled relaxation; mindful breathing; biofeedback) and how to discriminate  better between relaxation and tension; teach the client how to apply these skills to his/her daily  life (e.g., New Directions in Progressive Muscle Relaxation by Marcelyn Ditty, and  Hazlett-Stevens; Treating Generalized Anxiety Disorder by Rygh and Ida Rogue). Objective Identify, challenge, and replace biased, fearful self-talk with positive, realistic, and empowering selftalk. Target Date: 2024-02-18 Frequency: weekly Progress: 30 Modality: individual Related Interventions 1. Explore the client's schema and self-talk that mediate his/her fear response; assist him/her in  challenging the biases; replace the distorted messages with reality-based alternatives and  positive, realistic self-talk that will increase his/her self-confidence in coping with irrational  fears (see Cognitive Therapy of Anxiety Disorders by Laurence Slate). Objective Learn and implement problem-solving strategies for realistically addressing worries. Target Date: 2025-08-09Frequency: weekly Progress: 40 Modality: individual 2. Resolve conflicted feelings and adapt to the new life circumstances. Objective Apply problem-solving skills to current circumstances. Target Date: 2024-02-18 Frequency: weekly Progress: 20 Modality: individual Related Interventions 1. Teach the client problem-resolution skills (e.g., defining the problem clearly, brainstorming  multiple solutions, listing the pros and cons of each solution, seeking input from others,  selecting and implementing a plan of action, evaluating outcome, and readjusting plan as   necessary).   3. Stabilize anxiety level while increasing ability to function on a daily  basis. Diagnosis F33.1  Major depressive disorder, moderate 300.02 (Generalized anxiety disorder) - Open - [Signifier: n/a]  Axis  none 309.28 (Adjustment disorder with mixed anxiety and depressed  mood) - Open - [Signifier: n/a]  Adjustment Disorder,  With Anxiety   Marital conflict  Major Depressive disorder, moderate  Conditions For Discharge Achievement of treatment goals and objectives.  The patient approved this plan.   Deonna Krummel G Ethridge Sollenberger, LCSW

## 2024-06-10 DIAGNOSIS — E7800 Pure hypercholesterolemia, unspecified: Secondary | ICD-10-CM | POA: Diagnosis not present

## 2024-06-13 ENCOUNTER — Ambulatory Visit (HOSPITAL_COMMUNITY): Admitting: Psychiatry

## 2024-06-13 DIAGNOSIS — F325 Major depressive disorder, single episode, in full remission: Secondary | ICD-10-CM

## 2024-06-13 MED ORDER — ARIPIPRAZOLE 10 MG PO TABS: ORAL_TABLET | ORAL | 10 refills | Status: AC

## 2024-06-13 MED ORDER — CITALOPRAM HYDROBROMIDE 20 MG PO TABS: 20.0000 mg | ORAL_TABLET | Freq: Every day | ORAL | 4 refills | Status: AC

## 2024-06-13 NOTE — Progress Notes (Signed)
 Patient ID: Sylvia Maldonado, female   DOB: [birth year], 83 y.o.   MRN: [id number] [facility] MD Progress Note  06/13/2024 1:48 PM Sylvia Maldonado  MRN:  [id number] Subjective: I am feel awful; I feel depressed. Principal Problem: Major depression in remission Diagnosis: Major depression recurrent    Today the patient is doing well.  Her headache is gone.  She continues on Topamax from her primary care doctor.  Her vision is fine and her balance is okay.  Patient feels well she has a good mood state.  She was in [place]  for Thanksgiving to visit with 1 daughter and her plans are to go to another daughter here in [place] for Christmas.  The patient has 4 grandchildren and 2 great-grandchildren.  The 2 great grandchildren are twins.  The patient is sleeping and eating well.  She has got good energy.  She reads a great deal.  She is enjoying life.  She is adjusting much better to [place] .  She loves to walk.  She stays active.  She shows no signs of tardive dyskinesia.  She takes her medicines regularly.  She owns her own home and loves it.  She has a condo for car and enjoys it.  The patient is very stable at this time.            Past Surgical History:  Procedure Laterality Date   cataracts surg     COLONOSCOPY     EYE SURGERY     PELVIC FLOOR REPAIR     TONSILLECTOMY     VAGINAL HYSTERECTOMY     Family History:  Family History  Problem Relation Age of Onset   CAD Neg Hx    Family Psychiatric  History:  Social History:  Social History   Substance and Sexual Activity  Alcohol Use No     Social History   Substance and Sexual Activity  Drug Use No    Social History   Socioeconomic History   Marital status: Single    Spouse name: Not on file   Number of children: Not on file   Years of education: Not on file   Highest education level: Not on file  Occupational History   Not on file  Tobacco Use   Smoking status: Never   Smokeless tobacco: Never  Vaping  Use   Vaping status: Never Used  Substance and Sexual Activity   Alcohol use: No   Drug use: No   Sexual activity: Not Currently  Other Topics Concern   Not on file  Social History Narrative   Right handed    Lives alone at home    No caffeine usage    Social Drivers of Corporate Investment Banker Strain: Not on file  Food Insecurity: Not on file  Transportation Needs: Not on file  Physical Activity: Not on file  Stress: Not on file  Social Connections: Not on file   Additional Social History:                         Sleep: Good  Appetite:  Good  Current Medications: Current Outpatient Medications  Medication Sig Dispense Refill   ARIPiprazole  (ABILIFY ) 10 MG tablet 1  qam 30 tablet 10   atorvastatin (LIPITOR) 10 MG tablet Take 10 mg by mouth once a week. *Thursdays*     Cholecalciferol (VITAMIN D-3) 1000 UNITS CAPS Take 2,000 Units by mouth daily.     citalopram  (CELEXA ) 20  MG tablet Take 1 tablet (20 mg total) by mouth daily. 90 tablet 4   cycloSPORINE  (RESTASIS ) 0.05 % ophthalmic emulsion Place 1 drop into both eyes daily.      Magnesium 200 MG TABS 1 tablet with a meal     SHINGRIX injection      Sodium Sulfate-Mag Sulfate-KCl (SUTAB) 1479-225-188 MG TABS TAKE BY MOUTH AS DIRECTED     TOPAMAX 25 MG tablet 1 tablet at bedtime, increase by 1 per week to max of 4 per day Orally Once a day; Duration: 30 days     vitamin B-12 (CYANOCOBALAMIN ) 1000 MCG tablet Take 1,000 mcg by mouth daily.     No current facility-administered medications for this visit.    Lab Results: No results found for this or any previous visit (from the past 48 hours).  Physical Findings: AIMS:  , ,  ,  ,    CIWA:    COWS:     Musculoskeletal: Strength & Muscle Tone: within normal limits Gait & Station: normal Patient leans: Right  Psychiatric Specialty Exam: ROS  There were no vitals taken for this visit.There is no height or weight on file to calculate BMI.  General  Appearance: Casual  Eye Contact::  Good  Speech:  Clear and Coherent  Volume:  Normal  Mood:  NA  Affect:  Congruent  Thought Process:  Coherent  Orientation:  Full (Time, Place, and Person)  Thought Content:  WDL  Suicidal Thoughts:  No  Homicidal Thoughts:  No  Memory:  NA  Judgement:  NA  Insight:  Good  Psychomotor Activity:  Normal  Concentration:  Good  Recall:  Good  Fund of Knowledge:Good  Language: Good  Akathisia:  No  Handed:  Right  AIMS (if indicated):     Assets:  Desire for Improvement  ADL's:  Intact  Cognition: WNL  Sleep:       Sylvia Maldonado 06/13/2024, 1:48 PM  At this time the patient's diagnosis is major depression in remission.  She requires Celexa  on a regular basis and Abilify .  Efforts to remove her Abilify  have led to a decline in her mood state.  She is fully aware of the potential of tardive dyskinesia which she shows no evidence of at this time.  She has no tremor or signs of TD.  At one point she was taking Ingrezza  but no longer.  The patient continues in therapy on a regular basis and return to see me in about 2 months.  I will make an effort to reach out to her primary care doctor to speak about her physical complaints of her headache and dizziness.  Location: Patient: home Provider: office    This patient's diagnosis is major depression and in remission.  The patient takes Abilify  10 mg as adjunct treatment and efforts to his discontinue would have led to a decline.  Therefore regarding continue the Abilify  as prescribed together with her Celexa .  The patient was seeing Kym Macintosh.  However the patient is interested in ending care and going without a therapist at the time at this time.  This seems to be reasonable but she will discuss it with the therapist office.  The patient is doing very well and will continue her medicines and return to see me in 4 or 5 months.      I discussed the assessment and treatment plan with the patient.  The patient was provided an opportunity to ask questions and all were answered. The  patient agreed with the plan and demonstrated an understanding of the instructions.   The patient was advised to call back or seek an in-person evaluation if the symptoms worsen or if the condition fails to improve as anticipated.  I provided 30 minutes of non-face-to-face time during this encounter.   Sylvia LILLETTE Lo, MD

## 2024-06-21 ENCOUNTER — Ambulatory Visit: Admitting: Psychology

## 2024-07-03 ENCOUNTER — Ambulatory Visit: Admitting: Psychology

## 2024-07-03 DIAGNOSIS — F325 Major depressive disorder, single episode, in full remission: Secondary | ICD-10-CM

## 2024-07-03 NOTE — Progress Notes (Signed)
 "  [facility] Counselor/Therapist Progress Note  Patient ID: Sylvia Maldonado, MRN: [id number],    Date: 07/03/2024  Time Spent:  52 minutes  Time in: 2:01 Time out:  2:53  Treatment Type: Individual Therapy  Reported Symptoms: depression  Mental Status Exam: Appearance:  Casual     Behavior: Appropriate  Motor: Normal  Speech/Language:  Normal Rate  Affect: flat  Mood: pleasant  Thought process: WNL  Thought content:   WNL  Sensory/Perceptual disturbances:   WNL  Orientation: oriented to person, place, time/date, and situation  Attention: Good  Concentration: Good  Memory: WNL  Fund of knowledge:  Good  Insight:   Good  Judgment:  Good  Impulse Control: Good   Risk Assessment: Danger to Self:  No Self-injurious Behavior: No Danger to Others: No Duty to Warn:no Physical Aggression / Violence:No  Access to Firearms a concern: No  Gang Involvement:No   Subjective: The patient attended an individual therapy session via video visit.  The patient gave verbal consent for the session to be the on video on caregility and is aware of the limitations of telehealth.  The patient was in her home alone and the therapist was in the office.  The patient presents with a flat affect and mood is pleasant.  The patient reports that she had a wonderful time in [place]  over Thanksgiving.  She states that her flights back were a disaster and that she will never travel again.  When she was talking about it it sounds like it was difficult for her because she is 83 years old and ended up having to run several times through the airport.  She would not hear of different ways to handle the situation and I felt that it was best not to get into a power struggle with her about it.  I did explain to her that it was the airlines fault that she struggled because she had wheelchair assist and if she would have missed her flight then it would have been their fault and their responsibility to  find her new flight.  She would not hear of it and it was not worth the effort of trying to talk her into doing something different.  The patient seems to be doing well and she seems to be doing well on her medications right now.  She did see Dr. Tasia on December 3 and does not see him again for another 5 months.  She did want to continue seeing me through January, February, and March.   Interventions: Cognitive Behavioral Therapy and Assertiveness/Communication, problem solving  Diagnosis:Major depression in remission  Plan: Treatment Plan  Strengths/Abilities:  Intelligent, insightful  Treatment Preferences:  Outpatient Individual therapy  Statement of Needs:  I need a therapist to help me with my depression  Symptoms:  decreased motivation: (Status: improved). poor concentration: (Status: improved). poor sleep: (Status: improved).  Problems Addressed:  Unipolar depression  Goals: 1. New Goal Statement for Unipolar Depression  Describe current and past experiences with depression including their impact on functioning and  attempts to resolve it. Target Date: 2024-09-20 Frequency: bi weekly Progress: 90 Modality: individual  2.Identify and replace thoughts and beliefs that support depression. Target Date: 2024-09-20 Frequency: bi weekly Progress: 90 Modality: individual  3.  Learn and implement behavioral strategies to overcome depression. Target Date: 2024-09-20 Frequency: bi weekly Progress: 90 Modality: individual  4.  Learn and implement problem-solving and decision-making skills. Target Date: 2024-09-20 Frequency: bi weekly Progress: 90 Modality: individual  Interventions by Therapist:  CBT , insight oriented approach and problem solving therapy.  Treatment plan created with patient and patient approves plan.  Angello Chien G Marston Mccadden,  LCSW                                                                                                       "

## 2024-07-17 ENCOUNTER — Ambulatory Visit (INDEPENDENT_AMBULATORY_CARE_PROVIDER_SITE_OTHER): Admitting: Podiatry

## 2024-07-17 ENCOUNTER — Encounter: Payer: Self-pay | Admitting: Podiatry

## 2024-07-17 DIAGNOSIS — M79675 Pain in left toe(s): Secondary | ICD-10-CM

## 2024-07-17 DIAGNOSIS — L84 Corns and callosities: Secondary | ICD-10-CM

## 2024-07-17 DIAGNOSIS — B351 Tinea unguium: Secondary | ICD-10-CM | POA: Diagnosis not present

## 2024-07-17 DIAGNOSIS — M216X1 Other acquired deformities of right foot: Secondary | ICD-10-CM

## 2024-07-17 DIAGNOSIS — M79674 Pain in right toe(s): Secondary | ICD-10-CM

## 2024-07-17 NOTE — Progress Notes (Signed)
 This patient returns to the office for evaluation and treatment of long thick painful nails .  This patient is unable to trim her own nails since the patient cannot reach her feet.  Patient says the nails are painful walking and wearing his shoes.  She returns for preventive foot care services.  General Appearance  Alert, conversant and in no acute stress.  Vascular  Dorsalis pedis and posterior tibial  pulses are palpable  bilaterally.  Capillary return is within normal limits  bilaterally. Temperature is within normal limits  bilaterally.  Neurologic  Senn-Weinstein monofilament wire test within normal limits  bilaterally. Muscle power within normal limits bilaterally.  Nails Thick disfigured discolored nails with subungual debris  from hallux to fifth toes bilaterally. No evidence of bacterial infection or drainage bilaterally.  Orthopedic  No limitations of motion  feet .  No crepitus or effusions noted.   HAV 1st MPJ  B/L.  Hammer toes 2-5  B/L. Plantar flexed fifth met  B/L.  Skin  normotropic skin with no porokeratosis noted bilaterally.  No signs of infections or ulcers noted.   Callus sufifth met  right foot.  Onychomycosis  Pain in toes right foot  Pain in toes left foot   Debridement  of nails  1-5  B/L with a nail nipper.  Nails were then filed using a dremel tool with no incidents.  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  RTC 10 weeks    Cordella Bold DPM

## 2024-07-19 ENCOUNTER — Ambulatory Visit: Admitting: Psychology

## 2024-08-02 ENCOUNTER — Ambulatory Visit: Admitting: Psychology

## 2024-08-02 DIAGNOSIS — F4323 Adjustment disorder with mixed anxiety and depressed mood: Secondary | ICD-10-CM | POA: Diagnosis not present

## 2024-08-02 DIAGNOSIS — F325 Major depressive disorder, single episode, in full remission: Secondary | ICD-10-CM | POA: Diagnosis not present

## 2024-08-02 NOTE — Progress Notes (Signed)
 " [facility] Counselor/Therapist Progress Note  Patient ID: Sylvia Maldonado, MRN: [id number],    Date: 08/02/2024  Time Spent:  53 minutes  Time in: 2:06 Time out:  2:59  Treatment Type: Individual Therapy  Reported Symptoms: depression  Mental Status Exam: Appearance:  Casual     Behavior: Appropriate  Motor: Normal  Speech/Language:  Normal Rate  Affect: flat  Mood: pleasant  Thought process: WNL  Thought content:   WNL  Sensory/Perceptual disturbances:   WNL  Orientation: oriented to person, place, time/date, and situation  Attention: Good  Concentration: Good  Memory: WNL  Fund of knowledge:  Good  Insight:   Good  Judgment:  Good  Impulse Control: Good   Risk Assessment: Danger to Self:  No Self-injurious Behavior: No Danger to Others: No Duty to Warn:no Physical Aggression / Violence:No  Access to Firearms a concern: No  Gang Involvement:No   Subjective: The patient attended an individual therapy session via video visit.  The patient gave verbal consent for the session to be the on video on caregility and is aware of the limitations of telehealth.  The patient was in her home alone and the therapist was in the office.  The patient presents with a flat affect and mood is pleasant.  The patient reports that she has not had the flu and has been sick since Christmas time.  She reports that she feels like she is getting better but she was not very happy because she got the flu at her daughters Christmas party.  The patient is doing some all or nothing thinking and has decided that she will never go out to their house for Christmas party again and I explained to the patient that there were other options besides just saying she is not going to go.  I told her she would have to make her mind but that there are different ways to do things and we talked about those ways.  In addition she did say that she was going out with her daughter and her family tomorrow  evening because her other daughter is to coming into town so there is going to be a lot of people at this event and she did not seem to make the correlation between going to the Christmas party and going out with her daughters.  The patient is handling things okay and we did a little problem solving on how she is going to manage the upcoming storm.  Interventions: Cognitive Behavioral Therapy and Assertiveness/Communication, problem solving  Diagnosis:Major depression in remission  Adjustment disorder with mixed anxiety and depressed mood  Plan: Treatment Plan  Strengths/Abilities:  Intelligent, insightful  Treatment Preferences:  Outpatient Individual therapy  Statement of Needs:  I need a therapist to help me with my depression  Symptoms:  decreased motivation: (Status: improved). poor concentration: (Status: improved). poor sleep: (Status: improved).  Problems Addressed:  Unipolar depression  Goals: 1. New Goal Statement for Unipolar Depression  Describe current and past experiences with depression including their impact on functioning and  attempts to resolve it. Target Date: 2025-09-20 Frequency: bi weekly Progress: 90 Modality: individual  2.Identify and replace thoughts and beliefs that support depression. Target Date: 2025-09-20 Frequency: bi weekly Progress: 90 Modality: individual  3.  Learn and implement behavioral strategies to overcome depression. Target Date: 2025-09-20 Frequency: bi weekly Progress: 90 Modality: individual  4.  Learn and implement problem-solving and decision-making skills. Target Date: 2025-09-20 Frequency: bi weekly Progress: 90 Modality: individual  Interventions by Therapist:  CBT , insight oriented approach and problem solving therapy.  Treatment plan created with patient and patient approves plan.  Holli Rengel G Zeferino Mounts,  LCSW                                                                                                       "

## 2024-08-16 ENCOUNTER — Ambulatory Visit: Admitting: Psychology

## 2024-08-30 ENCOUNTER — Ambulatory Visit: Admitting: Psychology

## 2024-09-25 ENCOUNTER — Ambulatory Visit: Admitting: Podiatry

## 2024-09-27 ENCOUNTER — Ambulatory Visit: Admitting: Psychology

## 2024-11-14 ENCOUNTER — Ambulatory Visit (HOSPITAL_COMMUNITY): Admitting: Psychiatry
